# Patient Record
Sex: Male | Born: 1940 | Race: White | Hispanic: No | Marital: Married | Smoking: Former smoker
Health system: Southern US, Community
[De-identification: ages and names within clinical notes are randomized; demographics above are authoritative.]

## PROBLEM LIST (undated history)

## (undated) DIAGNOSIS — K219 Gastro-esophageal reflux disease without esophagitis: Secondary | ICD-10-CM

## (undated) DIAGNOSIS — E785 Hyperlipidemia, unspecified: Secondary | ICD-10-CM

## (undated) DIAGNOSIS — R05 Cough: Secondary | ICD-10-CM

## (undated) DIAGNOSIS — I10 Essential (primary) hypertension: Secondary | ICD-10-CM

## (undated) DIAGNOSIS — M1990 Unspecified osteoarthritis, unspecified site: Secondary | ICD-10-CM

## (undated) HISTORY — DX: Hyperlipidemia, unspecified: E78.5

## (undated) HISTORY — DX: Essential (primary) hypertension: I10

## (undated) HISTORY — DX: Unspecified osteoarthritis, unspecified site: M19.90

## (undated) HISTORY — DX: Gastro-esophageal reflux disease without esophagitis: K21.9

---
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
## 2004-09-28 ENCOUNTER — Emergency Department (HOSPITAL_COMMUNITY): Admission: EM | Admit: 2004-09-28 | Discharge: 2004-09-28 | Payer: Self-pay | Admitting: Emergency Medicine

## 2010-08-10 ENCOUNTER — Inpatient Hospital Stay (HOSPITAL_COMMUNITY): Admission: EM | Admit: 2010-08-10 | Discharge: 2010-08-10 | Payer: Self-pay | Admitting: Emergency Medicine

## 2010-09-04 ENCOUNTER — Ambulatory Visit: Payer: Self-pay | Admitting: Cardiovascular Disease

## 2010-09-12 ENCOUNTER — Telehealth (INDEPENDENT_AMBULATORY_CARE_PROVIDER_SITE_OTHER): Payer: Self-pay | Admitting: *Deleted

## 2010-09-16 ENCOUNTER — Ambulatory Visit: Payer: Self-pay

## 2010-09-16 ENCOUNTER — Ambulatory Visit: Payer: Self-pay | Admitting: Internal Medicine

## 2010-09-16 ENCOUNTER — Encounter: Payer: Self-pay | Admitting: Cardiology

## 2010-09-16 ENCOUNTER — Encounter (HOSPITAL_COMMUNITY): Admission: RE | Admit: 2010-09-16 | Discharge: 2010-11-28 | Payer: Self-pay | Admitting: Cardiovascular Disease

## 2010-09-18 ENCOUNTER — Ambulatory Visit: Payer: Self-pay

## 2011-01-28 NOTE — Progress Notes (Signed)
Summary: Nuclear Pre-Procedure  Phone Note Outgoing Call   Call placed by: Milana Na, EMT-P,  September 12, 2010 2:40 PM Summary of Call: Reviewed information on [employer] Information Sheet (see scanned document for further details).  Spoke with patient.     Nuclear Med Background Indications for Stress Test: Evaluation for Ischemia, Post Hospital  Indications Comments: 08/10/10 R/O MI (-) enzymes    Symptoms: Chest Pressure    Nuclear Pre-Procedure Cardiac Risk Factors: Family History - CAD, History of Smoking, Hypertension, Lipids  Nuclear Med Study Referring MD:  P.Nahser

## 2011-01-28 NOTE — Assessment & Plan Note (Signed)
Summary: Cardiology Nuclear Testing  Nuclear Med Background Indications for Stress Test: Evaluation for Ischemia, Post Hospital  Indications Comments: 08/10/10 R/O MI (-) enzymes    Symptoms: Chest Pressure, Palpitations    Nuclear Pre-Procedure Cardiac Risk Factors: Family History - CAD, History of Smoking, Hypertension, Lipids Caffeine/Decaff Intake: None NPO After: 7:30 PM Lungs: clear IV 0.9% NS with Angio Cath: 22g     IV Site: R Wrist IV Started by: Irean Hong, RN Chest Size (in) 50     Height (in): 74 Weight (lb): 233 BMI: 30.02  Nuclear Med Study 1 or 2 day study:  1 day     Stress Test Type:  Stress Reading MD:  Dietrich Pates, MD     Referring MD:  P.Nahser Resting Radionuclide:  Technetium 39m Tetrofosmin     Resting Radionuclide Dose:  33 mCi  Stress Radionuclide:  Technetium 61m Tetrofosmin     Stress Radionuclide Dose:  33 mCi   Stress Protocol Exercise Time (min):  6:00 min     Max HR:  134 bpm     Predicted Max HR:  153 bpm  Max Systolic BP: 196 mm Hg     Percent Max HR:  87.58 %     METS: 7.0 Rate Pressure Product:  04540    Stress Test Technologist:  Milana Na, EMT-P     Nuclear Technologist:  Domenic Polite, CNMT  Rest Procedure  Myocardial perfusion imaging was performed at rest 45 minutes following the intravenous administration of Technetium 35m Tetrofosmin.  Stress Procedure  The patient exercised for 6:00. The patient stopped due to fatigue and denied any chest pain.  There were no significant ST-T wave changes and rare pvcs.  Technetium 34m Tetrofosmin was injected at peak exercise and myocardial perfusion imaging was performed after a brief delay.  QPS Raw Data Images:  Mild diaphragmatic attenuation.  Normal left ventricular size. Stress Images:  Mild basal inferior perfusion defect.  Rest Images:  Mild basal inferior perfusion defect.  Subtraction (SDS):  Mild fixed basal inferior perfusion defect.  Transient Ischemic Dilatation:   0.81  (Normal <1.22)  Lung/Heart Ratio:  0.40  (Normal <0.45)  Quantitative Gated Spect Images QGS EDV:  78 ml QGS ESV:  23 ml QGS EF:  70 % QGS cine images:  Normal wall motion.    Overall Impression  Exercise Capacity: Fair exercise capacity. BP Response: Hypertensive blood pressure response. Clinical Symptoms: Fatigue, no chest pain.  ECG Impression: No significant ST segment change suggestive of ischemia. Overall Impression: Mild fixed basal inferior perfusion defect with normal wall motion likely represents diaphragmatic attenuation.  No ischemia.  Probably normal study.  Normal EF.

## 2011-03-14 LAB — DIFFERENTIAL
Basophils Absolute: 0.1 10*3/uL (ref 0.0–0.1)
Basophils Relative: 1 % (ref 0–1)
Eosinophils Absolute: 0.3 10*3/uL (ref 0.0–0.7)
Eosinophils Relative: 3 % (ref 0–5)
Lymphocytes Relative: 22 % (ref 12–46)
Lymphs Abs: 1.9 10*3/uL (ref 0.7–4.0)
Monocytes Absolute: 1 10*3/uL (ref 0.1–1.0)
Monocytes Relative: 11 % (ref 3–12)
Neutro Abs: 5.7 10*3/uL (ref 1.7–7.7)
Neutrophils Relative %: 64 % (ref 43–77)

## 2011-03-14 LAB — CK TOTAL AND CKMB (NOT AT ARMC)
CK, MB: 2.7 ng/mL (ref 0.3–4.0)
Relative Index: 2.6 — ABNORMAL HIGH (ref 0.0–2.5)
Total CK: 105 U/L (ref 7–232)

## 2011-03-14 LAB — CARDIAC PANEL(CRET KIN+CKTOT+MB+TROPI)
CK, MB: 2 ng/mL (ref 0.3–4.0)
CK, MB: 2.2 ng/mL (ref 0.3–4.0)
Relative Index: INVALID (ref 0.0–2.5)
Relative Index: INVALID (ref 0.0–2.5)
Total CK: 82 U/L (ref 7–232)
Total CK: 85 U/L (ref 7–232)
Troponin I: 0.02 ng/mL (ref 0.00–0.06)
Troponin I: <0.01 ng/mL (ref 0.00–0.06)

## 2011-03-14 LAB — CBC
HCT: 42.3 % (ref 39.0–52.0)
Hemoglobin: 14.6 g/dL (ref 13.0–17.0)
MCH: 34.8 pg — ABNORMAL HIGH (ref 26.0–34.0)
MCHC: 34.5 g/dL (ref 30.0–36.0)
MCV: 100.7 fL — ABNORMAL HIGH (ref 78.0–100.0)
Platelets: 172 10*3/uL (ref 150–400)
RBC: 4.2 MIL/uL — ABNORMAL LOW (ref 4.22–5.81)
RDW: 12.7 % (ref 11.5–15.5)
WBC: 8.9 10*3/uL (ref 4.0–10.5)

## 2011-03-14 LAB — POCT CARDIAC MARKERS
CKMB, poc: 1.2 ng/mL (ref 1.0–8.0)
Myoglobin, poc: 89.3 ng/mL (ref 12–200)
Troponin i, poc: <0.05 ng/mL (ref 0.00–0.09)

## 2011-03-14 LAB — LIPID PANEL
Cholesterol: 205 mg/dL — ABNORMAL HIGH (ref 0–200)
HDL: 34 mg/dL — ABNORMAL LOW (ref >39)
LDL Cholesterol: 129 mg/dL — ABNORMAL HIGH (ref 0–99)
Total CHOL/HDL Ratio: 6 RATIO
Triglycerides: 209 mg/dL — ABNORMAL HIGH (ref <150)
VLDL: 42 mg/dL — ABNORMAL HIGH (ref 0–40)

## 2011-03-14 LAB — BASIC METABOLIC PANEL
BUN: 12 mg/dL (ref 6–23)
CO2: 28 mEq/L (ref 19–32)
Calcium: 9.1 mg/dL (ref 8.4–10.5)
Chloride: 101 mEq/L (ref 96–112)
Creatinine, Ser: 1.07 mg/dL (ref 0.4–1.5)
GFR calc Af Amer: >60 mL/min (ref >60)
GFR calc non Af Amer: >60 mL/min (ref >60)
Glucose, Bld: 97 mg/dL (ref 70–99)
Potassium: 3.8 mEq/L (ref 3.5–5.1)
Sodium: 137 mEq/L (ref 135–145)

## 2011-03-14 LAB — TROPONIN I: Troponin I: <0.01 ng/mL (ref 0.00–0.06)

## 2012-05-05 ENCOUNTER — Encounter: Payer: Self-pay | Admitting: *Deleted

## 2012-05-05 DIAGNOSIS — M1990 Unspecified osteoarthritis, unspecified site: Secondary | ICD-10-CM | POA: Insufficient documentation

## 2012-05-05 DIAGNOSIS — R079 Chest pain, unspecified: Secondary | ICD-10-CM | POA: Insufficient documentation

## 2013-03-16 ENCOUNTER — Encounter: Payer: Self-pay | Admitting: Cardiology

## 2016-08-08 ENCOUNTER — Emergency Department (HOSPITAL_COMMUNITY)
Admission: EM | Admit: 2016-08-08 | Discharge: 2016-08-08 | Disposition: A | Discharge status: Home / Self Care | Payer: Medicare HMO | Attending: Emergency Medicine | Admitting: Emergency Medicine

## 2016-08-08 ENCOUNTER — Emergency Department (HOSPITAL_COMMUNITY): Payer: Medicare HMO

## 2016-08-08 ENCOUNTER — Encounter (HOSPITAL_COMMUNITY): Payer: Self-pay | Admitting: Emergency Medicine

## 2016-08-08 DIAGNOSIS — I10 Essential (primary) hypertension: Secondary | ICD-10-CM | POA: Insufficient documentation

## 2016-08-08 DIAGNOSIS — R12 Heartburn: Secondary | ICD-10-CM | POA: Insufficient documentation

## 2016-08-08 DIAGNOSIS — Z87891 Personal history of nicotine dependence: Secondary | ICD-10-CM | POA: Diagnosis not present

## 2016-08-08 DIAGNOSIS — R079 Chest pain, unspecified: Secondary | ICD-10-CM

## 2016-08-08 LAB — I-STAT TROPONIN, ED: Troponin i, poc: 0 ng/mL (ref 0.00–0.08)

## 2016-08-08 LAB — COMPREHENSIVE METABOLIC PANEL
ALT: 36 U/L (ref 17–63)
AST: 30 U/L (ref 15–41)
Albumin: 4.2 g/dL (ref 3.5–5.0)
Alkaline Phosphatase: 58 U/L (ref 38–126)
Anion gap: 11 (ref 5–15)
BUN: 22 mg/dL — ABNORMAL HIGH (ref 6–20)
CO2: 25 mmol/L (ref 22–32)
Calcium: 9.5 mg/dL (ref 8.9–10.3)
Chloride: 99 mmol/L — ABNORMAL LOW (ref 101–111)
Creatinine, Ser: 1.06 mg/dL (ref 0.61–1.24)
GFR calc Af Amer: >60 mL/min (ref >60)
GFR calc non Af Amer: >60 mL/min (ref >60)
Glucose, Bld: 136 mg/dL — ABNORMAL HIGH (ref 65–99)
Potassium: 3.8 mmol/L (ref 3.5–5.1)
Sodium: 135 mmol/L (ref 135–145)
Total Bilirubin: 0.3 mg/dL (ref 0.3–1.2)
Total Protein: 7.1 g/dL (ref 6.5–8.1)

## 2016-08-08 LAB — CBC WITH DIFFERENTIAL/PLATELET
Basophils Absolute: 0.1 10*3/uL (ref 0.0–0.1)
Basophils Relative: 1 %
Eosinophils Absolute: 0.4 10*3/uL (ref 0.0–0.7)
Eosinophils Relative: 5 %
HCT: 46.2 % (ref 39.0–52.0)
Hemoglobin: 15.3 g/dL (ref 13.0–17.0)
Lymphocytes Relative: 39 %
Lymphs Abs: 3.2 10*3/uL (ref 0.7–4.0)
MCH: 33.3 pg (ref 26.0–34.0)
MCHC: 33.1 g/dL (ref 30.0–36.0)
MCV: 100.7 fL — ABNORMAL HIGH (ref 78.0–100.0)
Monocytes Absolute: 0.7 10*3/uL (ref 0.1–1.0)
Monocytes Relative: 8 %
Neutro Abs: 3.8 10*3/uL (ref 1.7–7.7)
Neutrophils Relative %: 47 %
Platelets: 223 10*3/uL (ref 150–400)
RBC: 4.59 MIL/uL (ref 4.22–5.81)
RDW: 12.6 % (ref 11.5–15.5)
WBC: 8.2 10*3/uL (ref 4.0–10.5)

## 2016-08-08 NOTE — ED Provider Notes (Signed)
[facility]PT Provider Note   CSN: [id number] Arrival date & time: 08/08/16  7829  First Provider Contact:  None       History   Chief Complaint Chief Complaint  Patient presents with  . Hypertension  . Heartburn    HPI Tristan Le is a 75 y.o. male.  The history is provided by the patient.  Hypertension  This is a new problem. The current episode started 3 to 5 hours ago. The problem occurs constantly. The problem has been gradually improving. Associated symptoms include chest pain. Pertinent negatives include no abdominal pain, no headaches and no shortness of breath. Nothing aggravates the symptoms. Nothing relieves the symptoms.  Heartburn  This is a new problem. The current episode started 3 to 5 hours ago. The problem occurs constantly. The problem has been resolved. Associated symptoms include chest pain. Pertinent negatives include no abdominal pain, no headaches and no shortness of breath. Nothing aggravates the symptoms. Relieved by: alka seltzer. Treatments tried: Catering manager. The treatment provided moderate relief.  Patient with h/o GERD/HTN/hyperlipidemia presents with heartburn/chest pain and hypertension He reports he "ate like a horse" yesterday, and after going to bed he began having what felt like chest pain and heart burn He took alka seltzer and now feels improved No fever/vomiting/sob No diaphoresis Denies h/o CAD  Past Medical History:  Diagnosis Date  . Arthritis   . Chest pain   . GERD (gastroesophageal reflux disease)   . Hyperlipidemia   . Hypertension     Patient Active Problem List   Diagnosis Date Noted  . Chest pain   . Arthritis     History reviewed. No pertinent surgical history.     Home Medications    Prior to Admission medications   Medication Sig Start Date End Date Taking? Authorizing Provider  cholecalciferol (VITAMIN D) 400 UNITS TABS Take by mouth.   Yes Historical Provider, MD  diphenhydrAMINE (BENADRYL) 25  mg capsule Take 25 mg by mouth every 6 (six) hours as needed for allergies.   Yes Historical Provider, MD  enalapril (VASOTEC) 10 MG tablet Take 10 mg by mouth daily.   Yes Historical Provider, MD  hydrochlorothiazide (MICROZIDE) 12.5 MG capsule Take 12.5 mg by mouth daily.   Yes Historical Provider, MD  meloxicam (MOBIC) 7.5 MG tablet Take 7.5 mg by mouth daily.   Yes Historical Provider, MD  Omega-3 Fatty Acids (FISH OIL PO) Take 1 capsule by mouth daily.   Yes Historical Provider, MD    Family History Family History  Problem Relation Age of Onset  . Heart disease Father   . Heart disease Brother   . Hyperlipidemia Brother     Social History Social History  Substance Use Topics  . Smoking status: Former Games developer  . Smokeless tobacco: Not on file  . Alcohol use No     Allergies   Penicillins and Sulfonamide derivatives   Review of Systems Review of Systems  Constitutional: Negative for diaphoresis and fever.  Respiratory: Negative for shortness of breath.   Cardiovascular: Positive for chest pain.  Gastrointestinal: Positive for heartburn. Negative for abdominal pain and vomiting.  Neurological: Negative for headaches.  All other systems reviewed and are negative.    Physical Exam Updated Vital Signs BP 135/78   Pulse 71   Temp 98.3 F (36.8 C) (Oral)   Resp 13   Ht  (1.854 m)   Wt 108.1 kg   SpO2 97%   BMI 31.44 kg/m  Physical Exam CONSTITUTIONAL: Well developed/well nourished HEAD: Normocephalic/atraumatic EYES: EOMI/PERRL ENMT: Mucous membranes moist NECK: supple no meningeal signs SPINE/BACK:entire spine nontender CV: S1/S2 noted, no murmurs/rubs/gallops noted LUNGS: Lungs are clear to auscultation bilaterally, no apparent distress ABDOMEN: soft, nontender, no rebound or guarding, bowel sounds noted throughout abdomen NEURO: Pt is awake/alert/appropriate, moves all extremitiesx4.  No facial droop.   EXTREMITIES: pulses normal/equal, full ROM, no  calf tenderness SKIN: warm, color normal PSYCH: no abnormalities of mood noted, alert and oriented to situation   ED Treatments / Results  Labs (all labs ordered are listed, but only abnormal results are displayed) Labs Reviewed  CBC WITH DIFFERENTIAL/PLATELET - Abnormal; Notable for the following:       Result Value   MCV 100.7 (*)    All other components within normal limits  COMPREHENSIVE METABOLIC PANEL - Abnormal; Notable for the following:    Chloride 99 (*)    Glucose, Bld 136 (*)    BUN 22 (*)    All other components within normal limits  I-STAT TROPOININ, ED    EKG  EKG Interpretation  Date/Time:  Friday August 08 2016 03:23:15 EDT Ventricular Rate:  81 PR Interval:  188 QRS Duration: 78 QT Interval:  376 QTC Calculation: 436 R Axis:   18 Text Interpretation:  Normal sinus rhythm Normal ECG No significant change since last tracing Confirmed by Bebe ShaggyWICKLINE  MD, Fortune Torosian ([id number]) on 08/08/2016 5:54:16 AM       Radiology Dg Chest 2 View  Result Date: 08/08/2016 CLINICAL DATA:  Acute onset of central chest pain and discomfort. Initial encounter. EXAM: CHEST  2 VIEW COMPARISON:  Chest radiograph performed 08/09/2010 FINDINGS: The lungs are well-aerated and clear. There is no evidence of focal opacification, pleural effusion or pneumothorax. The heart is normal in size; the mediastinal contour is within normal limits. No acute osseous abnormalities are seen. IMPRESSION: No acute cardiopulmonary process seen. Electronically Signed   By: Roanna RaiderJeffery  Chang M.D.   On: 08/08/2016 04:19    Procedures Procedures (including critical care time)  Medications Ordered in ED Medications - No data to display   Initial Impression / Assessment and Plan / ED Course  I have reviewed the triage vital signs and the nursing notes.  Pertinent labs & imaging results that were available during my care of the patient were reviewed by me and considered in my medical decision making (see chart for  details).  Clinical Course    Pt well appearing He reports episode of CP/heartburn.  He was noted to have hypertension at home that was concerning to him Given history/age, I did advise further testing and potentially admission for ACS evaluation Patient would prefer to go home.  He thinks it is all related to eating heavily yesterday I discussed that I Can not rule out ACS with one ekg/troponin and he understands this risk We discussed strict ER return precautions   Final Clinical Impressions(s) / ED Diagnoses   Final diagnoses:  Chest pain, unspecified chest pain type  Heart burn    New Prescriptions New Prescriptions   No medications on file     Zadie Rhineonald Taylin Mans, MD 08/08/16 [phone]

## 2016-08-08 NOTE — Discharge Instructions (Signed)

## 2016-08-08 NOTE — ED Triage Notes (Signed)
Pt. woke up at midnight with " heartburn " took Brink's Companylka Seltzer with relief , pt. added elevated blood pressure this evening 188/75 and chills .

## 2017-01-27 ENCOUNTER — Encounter (HOSPITAL_COMMUNITY): Payer: Self-pay

## 2017-01-27 DIAGNOSIS — Z87891 Personal history of nicotine dependence: Secondary | ICD-10-CM | POA: Diagnosis not present

## 2017-01-27 DIAGNOSIS — M79652 Pain in left thigh: Secondary | ICD-10-CM | POA: Diagnosis present

## 2017-01-27 DIAGNOSIS — I714 Abdominal aortic aneurysm, without rupture: Secondary | ICD-10-CM | POA: Insufficient documentation

## 2017-01-27 DIAGNOSIS — I10 Essential (primary) hypertension: Secondary | ICD-10-CM | POA: Insufficient documentation

## 2017-01-27 NOTE — ED Triage Notes (Signed)
Pt states she started having sharp stabbing pain in left thigh tonight; pt states " I belive I have a DVT"; pt denies hx of DVT; pt has not symptoms of DVT, denies redness swelling, injury, or SOB; Pt state he just had sudden onset of thigh pain; pt ambulated to triage room; pt a&ox 4 on arrival; pt states pain at 2/10 on arrival

## 2017-01-28 ENCOUNTER — Emergency Department (HOSPITAL_BASED_OUTPATIENT_CLINIC_OR_DEPARTMENT_OTHER): Payer: Medicare HMO

## 2017-01-28 ENCOUNTER — Emergency Department (HOSPITAL_COMMUNITY)
Admission: EM | Admit: 2017-01-28 | Discharge: 2017-01-28 | Disposition: A | Discharge status: Home / Self Care | Payer: Medicare HMO | Attending: Emergency Medicine | Admitting: Emergency Medicine

## 2017-01-28 DIAGNOSIS — M79609 Pain in unspecified limb: Secondary | ICD-10-CM | POA: Diagnosis not present

## 2017-01-28 DIAGNOSIS — I714 Abdominal aortic aneurysm, without rupture, unspecified: Secondary | ICD-10-CM

## 2017-01-28 DIAGNOSIS — M79605 Pain in left leg: Secondary | ICD-10-CM

## 2017-01-28 LAB — CBC WITH DIFFERENTIAL/PLATELET
Basophils Absolute: 0 10*3/uL (ref 0.0–0.1)
Basophils Relative: 0 %
Eosinophils Absolute: 0.2 10*3/uL (ref 0.0–0.7)
Eosinophils Relative: 2 %
HCT: 45 % (ref 39.0–52.0)
Hemoglobin: 15.3 g/dL (ref 13.0–17.0)
Lymphocytes Relative: 25 %
Lymphs Abs: 2.5 10*3/uL (ref 0.7–4.0)
MCH: 34.1 pg — ABNORMAL HIGH (ref 26.0–34.0)
MCHC: 34 g/dL (ref 30.0–36.0)
MCV: 100.2 fL — ABNORMAL HIGH (ref 78.0–100.0)
Monocytes Absolute: 0.9 10*3/uL (ref 0.1–1.0)
Monocytes Relative: 9 %
Neutro Abs: 6.1 10*3/uL (ref 1.7–7.7)
Neutrophils Relative %: 64 %
Platelets: 201 10*3/uL (ref 150–400)
RBC: 4.49 MIL/uL (ref 4.22–5.81)
RDW: 12.2 % (ref 11.5–15.5)
WBC: 9.7 10*3/uL (ref 4.0–10.5)

## 2017-01-28 LAB — BASIC METABOLIC PANEL
Anion gap: 12 (ref 5–15)
BUN: 18 mg/dL (ref 6–20)
CO2: 23 mmol/L (ref 22–32)
Calcium: 9.7 mg/dL (ref 8.9–10.3)
Chloride: 99 mmol/L — ABNORMAL LOW (ref 101–111)
Creatinine, Ser: 1.05 mg/dL (ref 0.61–1.24)
GFR calc Af Amer: >60 mL/min (ref >60)
GFR calc non Af Amer: >60 mL/min (ref >60)
Glucose, Bld: 121 mg/dL — ABNORMAL HIGH (ref 65–99)
Potassium: 4.3 mmol/L (ref 3.5–5.1)
Sodium: 134 mmol/L — ABNORMAL LOW (ref 135–145)

## 2017-01-28 LAB — D-DIMER, QUANTITATIVE (NOT AT ARMC): D-Dimer, Quant: 0.6 ug/mL-FEU — ABNORMAL HIGH (ref 0.00–0.50)

## 2017-01-28 MED ORDER — RIVAROXABAN 15 MG PO TABS
15.0000 mg | ORAL_TABLET | Freq: Once | ORAL | Status: AC
  Administered 2017-01-28: 15 mg via ORAL
  Filled 2017-01-28: qty 1

## 2017-01-28 MED ORDER — HYDROCODONE-ACETAMINOPHEN 5-325 MG PO TABS
1.0000 | ORAL_TABLET | Freq: Once | ORAL | Status: AC
  Administered 2017-01-28: 1 via ORAL
  Filled 2017-01-28: qty 1

## 2017-01-28 NOTE — Discharge Instructions (Signed)
The ultrasound was negative for a blood clot. Please follow up with your primary care provider as soon as possible for any further management of this issue. May use ibuprofen or naproxen for pain.

## 2017-01-28 NOTE — ED Provider Notes (Signed)
Took patient care [facility]off [facility]-C.  In short, patient presents with left upper leg pain and is awaiting a DVT rule out with duplex ultrasound. If positive, anticoagulate and discharge. If negative, discharge with pain management and PCP follow up.   Duplex ultrasound negative for DVT. Patient updated on results. PCP follow-up. Return precautions discussed. Patient states he does not need or want narcotic pain medications and is content taking ibuprofen.   Vitals:   01/28/17 0400 01/28/17 0430 01/28/17 0500 01/28/17 0600  BP: 111/68 114/68 98/56 99/59   Pulse: 73 77 76 74  Resp:      Temp:      TempSrc:      SpO2: 95% 96% 95% 95%  Weight:      Height:          Results for orders placed or performed during the hospital encounter of 01/28/17  D-dimer, quantitative  Result Value Ref Range   D-Dimer, Quant 0.60 (H) 0.00 - 0.50 ug/mL-FEU  CBC with Differential  Result Value Ref Range   WBC 9.7 4.0 - 10.5 K/uL   RBC 4.49 4.22 - 5.81 MIL/uL   Hemoglobin 15.3 13.0 - 17.0 g/dL   HCT 16.145.0 09.639.0 - 04.552.0 %   MCV 100.2 (H) 78.0 - 100.0 fL   MCH 34.1 (H) 26.0 - 34.0 pg   MCHC 34.0 30.0 - 36.0 g/dL   RDW 40.912.2 81.111.5 - 91.415.5 %   Platelets 201 150 - 400 K/uL   Neutrophils Relative % 64 %   Neutro Abs 6.1 1.7 - 7.7 K/uL   Lymphocytes Relative 25 %   Lymphs Abs 2.5 0.7 - 4.0 K/uL   Monocytes Relative 9 %   Monocytes Absolute 0.9 0.1 - 1.0 K/uL   Eosinophils Relative 2 %   Eosinophils Absolute 0.2 0.0 - 0.7 K/uL   Basophils Relative 0 %   Basophils Absolute 0.0 0.0 - 0.1 K/uL  Basic metabolic panel  Result Value Ref Range   Sodium 134 (L) 135 - 145 mmol/L   Potassium 4.3 3.5 - 5.1 mmol/L   Chloride 99 (L) 101 - 111 mmol/L   CO2 23 22 - 32 mmol/L   Glucose, Bld 121 (H) 65 - 99 mg/dL   BUN 18 6 - 20 mg/dL   Creatinine, Ser 7.821.05 0.61 - 1.24 mg/dL   Calcium 9.7 8.9 - 95.610.3 mg/dL   GFR calc non Af Amer >60 >60 mL/min   GFR calc Af Amer >60 >60 mL/min   Anion gap 12 5 -  15   No results found.   Anselm PancoastShawn C Pryce Folts, PA-C 01/28/17 [id number]    Anselm PancoastShawn C Jerryl Holzhauer, PA-C 01/28/17 [phone]    Maia PlanJoshua G Long, MD 01/28/17 [phone]

## 2017-01-28 NOTE — ED Provider Notes (Signed)
[facility] Provider Note   CSN: [id number] Arrival date & time: 01/27/17  2335     History   Chief Complaint Chief Complaint  Patient presents with  . Leg Pain    HPI Tristan Le is a 76 y.o. male.  HPI Patient presents to the emergency department with needed.  Left thigh pain started tonight around 10 PM the patient states that he thinks he has a DVT based on his professional opinion.  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  Patient states that palpation of the specific area makes the pain worse.  He states he has not had any redness, swelling, shortness of breath or chest pain. The patient denies chest pain, shortness of breath, headache,blurred vision, neck pain, fever, cough, weakness, numbness, dizziness, anorexia, edema, abdominal pain, nausea, vomiting, diarrhea, rash, back pain, dysuria, hematemesis, bloody stool, near syncope, or syncope. Past Medical History:  Diagnosis Date  . Arthritis   . Chest pain   . GERD (gastroesophageal reflux disease)   . Hyperlipidemia   . Hypertension     Patient Active Problem List   Diagnosis Date Noted  . Chest pain   . Arthritis     History reviewed. No pertinent surgical history.     Home Medications    Prior to Admission medications   Medication Sig Start Date End Date Taking? Authorizing Provider  enalapril (VASOTEC) 10 MG tablet Take 10 mg by mouth daily.   Yes Historical Provider, MD  hydrochlorothiazide (MICROZIDE) 12.5 MG capsule Take 12.5 mg by mouth daily.   Yes Historical Provider, MD  meloxicam (MOBIC) 7.5 MG tablet Take 7.5 mg by mouth daily as needed for pain.    Yes Historical Provider, MD    Family History Family History  Problem Relation Age of Onset  . Heart disease Father   . Heart disease Brother   . Hyperlipidemia Brother     Social History Social History  Substance Use Topics  . Smoking status: Former Games developer  .  Smokeless tobacco: Not on file  . Alcohol use No     Allergies   Penicillins and Sulfonamide derivatives   Review of Systems Review of Systems All other systems negative except as documented in the HPI. All pertinent positives and negatives as reviewed in the HPI.  Physical Exam Updated Vital Signs BP 99/59   Pulse 74   Temp 97.4 F (36.3 C) (Oral)   Resp 20   Ht 6\' 1"  (1.854 m)   Wt 104.8 kg   SpO2 95%   BMI 30.48 kg/m   Physical Exam  Constitutional: He is oriented to person, place, and time. He appears well-developed and well-nourished. No distress.  HENT:  Head: Normocephalic and atraumatic.  Mouth/Throat: Oropharynx is clear and moist.  Eyes: Pupils are equal, round, and reactive to light.  Neck: Normal range of motion. Neck supple.  Cardiovascular: Normal rate, regular rhythm and normal heart sounds.  Exam reveals no gallop and no friction rub.   No murmur heard. Pulmonary/Chest: Effort normal and breath sounds normal. No respiratory distress. He has no wheezes.  Abdominal: Soft. Bowel sounds are normal. He exhibits no distension. There is no tenderness.  Musculoskeletal:       Legs: Neurological: He is alert and oriented to person, place, and time. He exhibits normal muscle tone. Coordination normal.  Skin: Skin is warm and dry. No rash noted. No erythema.  Psychiatric:  He has a normal mood and affect. His behavior is normal.  Nursing note and vitals reviewed.    ED Treatments / Results  Labs (all labs ordered are listed, but only abnormal results are displayed) Labs Reviewed  D-DIMER, QUANTITATIVE (NOT AT [facility]) - Abnormal; Notable for the following:       Result Value   D-Dimer, Quant 0.60 (*)    All other components within normal limits  CBC WITH DIFFERENTIAL/PLATELET - Abnormal; Notable for the following:    MCV 100.2 (*)    MCH 34.1 (*)    All other components within normal limits  BASIC METABOLIC PANEL - Abnormal; Notable for the following:     Sodium 134 (*)    Chloride 99 (*)    Glucose, Bld 121 (*)    All other components within normal limits    EKG  EKG Interpretation None       Radiology No results found.  Procedures Procedures (including critical care time)  Medications Ordered in ED Medications  HYDROcodone-acetaminophen (NORCO/VICODIN) 5-325 MG per tablet 1 tablet (1 tablet Oral Given 01/28/17 0338)  Rivaroxaban (XARELTO) tablet 15 mg (15 mg Oral Given 01/28/17 0631)     Initial Impression / Assessment and Plan / ED Course  I have reviewed the triage vital signs and the nursing notes.  Pertinent labs & imaging results that were available during my care of the patient were reviewed by me and considered in my medical decision making (see chart for details).     Patient will have a DVT study in the morning.  If this is negative and a referral back to his primary care doctor.   Final Clinical Impressions(s) / ED Diagnoses   Final diagnoses:  None    New Prescriptions New Prescriptions   No medications on file     Charlestine NightChristopher Ayden Hardwick, PA-C 01/28/17 [id number]    Dione Boozeavid Glick, MD 01/28/17 [phone]

## 2017-01-28 NOTE — Progress Notes (Signed)
*  PRELIMINARY RESULTS* Vascular Ultrasound Left lower extremity venous duplex has been completed.  Preliminary findings: No evidence of DVT or baker's cyst.    Farrel DemarkJill Eunice, RDMS, RVT  01/28/2017, 8:04 AM

## 2017-04-27 ENCOUNTER — Other Ambulatory Visit (HOSPITAL_COMMUNITY): Payer: Self-pay | Admitting: General Practice

## 2017-04-27 DIAGNOSIS — R1011 Right upper quadrant pain: Secondary | ICD-10-CM

## 2017-05-01 ENCOUNTER — Ambulatory Visit (HOSPITAL_COMMUNITY)
Admission: RE | Admit: 2017-05-01 | Discharge: 2017-05-01 | Disposition: A | Discharge status: Home / Self Care | Payer: Medicare HMO | Source: Ambulatory Visit | Attending: General Practice | Admitting: General Practice

## 2017-05-01 DIAGNOSIS — R1011 Right upper quadrant pain: Secondary | ICD-10-CM | POA: Insufficient documentation

## 2018-02-08 ENCOUNTER — Encounter (HOSPITAL_COMMUNITY): Payer: Self-pay | Admitting: Emergency Medicine

## 2018-02-08 ENCOUNTER — Ambulatory Visit (HOSPITAL_COMMUNITY)
Admission: EM | Admit: 2018-02-08 | Discharge: 2018-02-08 | Disposition: A | Discharge status: Home / Self Care | Payer: Medicare HMO

## 2018-02-08 DIAGNOSIS — R109 Unspecified abdominal pain: Secondary | ICD-10-CM

## 2018-02-08 DIAGNOSIS — R1011 Right upper quadrant pain: Secondary | ICD-10-CM

## 2018-02-08 DIAGNOSIS — R110 Nausea: Secondary | ICD-10-CM

## 2018-02-08 MED ORDER — OMEPRAZOLE 20 MG PO CPDR: 20.0000 mg | DELAYED_RELEASE_CAPSULE | Freq: Every day | ORAL | 1 refills | Status: DC

## 2018-02-08 NOTE — ED Triage Notes (Signed)
PT reports RUQ pain for 3-4 months. PT saw his PCP 2-3 weeks ago and is being treated for a suspected peptic ulcer. PT is taking zantac and a probiotic. PT reports nausea started today. No change in BMs

## 2018-02-08 NOTE — ED Provider Notes (Signed)
  MRN: [id number] DOB: 20-Mar-1941  Subjective:   Tristan Le is a 77 y.o. male presenting for 4 month history of RUQ and epigastric pain. Pain is throbbing or pressure type sensation, worse with eating beef. Patient has had GI issues that he's worked on for years, thought he had lactose intolerance. Last OV was 12/30/2017 with his PCP. He is currently using ranitidine for peptic ulcer. He did not have imaging, bloodwork done, admits that he did not want this at the time. Has mild occasional nausea without vomiting. Denies fever, constipation, bloody stools, diarrhea, hematuria, dysuria, chest pain. Denies smoking cigarettes or drinking alcohol.   Tristan Le is allergic to penicillins and sulfonamide derivatives.  Tristan Le  has a past medical history of Arthritis, Chest pain, GERD (gastroesophageal reflux disease), Hyperlipidemia, and Hypertension. Denies history of surgeries. His family history includes Heart disease in his brother and father; Hyperlipidemia in his brother.   Objective:   Vitals: BP 129/69   Pulse 89   Temp 98.3 F (36.8 C) (Oral)   Resp 16   Wt 220 lb (99.8 kg)   SpO2 100%   BMI 29.03 kg/m   Physical Exam  Constitutional: He is oriented to person, place, and time. He appears well-developed and well-nourished.  HENT:  Mouth/Throat: Oropharynx is clear and moist.  Cardiovascular: Normal rate, regular rhythm and intact distal pulses. Exam reveals no gallop and no friction rub.  No murmur heard. Pulmonary/Chest: No respiratory distress. He has no wheezes. He has no rales.  Abdominal: Soft. Bowel sounds are normal. He exhibits no distension and no mass. There is no tenderness. There is no guarding.  Neurological: He is alert and oriented to person, place, and time.  Skin: Skin is warm and dry.  Psychiatric: He has a normal mood and affect.   Assessment and Plan :   Right upper quadrant abdominal pain  Nausea  Right flank pain  Unable to elicit pain on exam.  Counseled that patient should try to get U/S, CT and/or bloodwork as his PCP recommended for his pain. Stop ranitidine given that he has been on this for 3 months now. Start Prilosec, hydrate well. Return-to-clinic precautions discussed, patient verbalized understanding.    Wallis BambergMani, Antoneo Ghrist, PA-C 02/08/18 1701

## 2018-02-08 NOTE — Discharge Instructions (Signed)
Hydrate well with at least 2 liters (1 gallon) of water daily. You may take 500mg  Tylenol every 6 hours for pain and inflammation. Do not take any NSAID including diclofenac, ibuprofen, naproxen, Aleve, Motrin, etc.

## 2018-02-11 ENCOUNTER — Encounter (HOSPITAL_COMMUNITY): Payer: Self-pay | Admitting: Emergency Medicine

## 2018-02-11 ENCOUNTER — Other Ambulatory Visit: Payer: Self-pay

## 2018-02-11 DIAGNOSIS — I10 Essential (primary) hypertension: Secondary | ICD-10-CM | POA: Insufficient documentation

## 2018-02-11 DIAGNOSIS — Z79899 Other long term (current) drug therapy: Secondary | ICD-10-CM | POA: Insufficient documentation

## 2018-02-11 DIAGNOSIS — R1011 Right upper quadrant pain: Secondary | ICD-10-CM | POA: Insufficient documentation

## 2018-02-11 DIAGNOSIS — R1031 Right lower quadrant pain: Secondary | ICD-10-CM | POA: Insufficient documentation

## 2018-02-11 LAB — COMPREHENSIVE METABOLIC PANEL
ALBUMIN: 3.8 g/dL (ref 3.5–5.0)
ALT: 30 U/L (ref 17–63)
AST: 29 U/L (ref 15–41)
Alkaline Phosphatase: 57 U/L (ref 38–126)
Anion gap: 9 (ref 5–15)
BUN: 15 mg/dL (ref 6–20)
CO2: 24 mmol/L (ref 22–32)
CREATININE: 1.08 mg/dL (ref 0.61–1.24)
Calcium: 9.1 mg/dL (ref 8.9–10.3)
Chloride: 102 mmol/L (ref 101–111)
GFR calc Af Amer: >60 mL/min (ref >60)
GFR calc non Af Amer: >60 mL/min (ref >60)
Glucose, Bld: 164 mg/dL — ABNORMAL HIGH (ref 65–99)
Potassium: 3.8 mmol/L (ref 3.5–5.1)
SODIUM: 135 mmol/L (ref 135–145)
Total Bilirubin: 0.6 mg/dL (ref 0.3–1.2)
Total Protein: 6.6 g/dL (ref 6.5–8.1)

## 2018-02-11 LAB — URINALYSIS, ROUTINE W REFLEX MICROSCOPIC
Bilirubin Urine: NEGATIVE
GLUCOSE, UA: NEGATIVE mg/dL
HGB URINE DIPSTICK: NEGATIVE
Ketones, ur: NEGATIVE mg/dL
Leukocytes, UA: NEGATIVE
Nitrite: NEGATIVE
Protein, ur: NEGATIVE mg/dL
Specific Gravity, Urine: 1.012 (ref 1.005–1.030)
pH: 7 (ref 5.0–8.0)

## 2018-02-11 LAB — LIPASE, BLOOD: LIPASE: 37 U/L (ref 11–51)

## 2018-02-11 LAB — CBC
HCT: 43.9 % (ref 39.0–52.0)
Hemoglobin: 14.9 g/dL (ref 13.0–17.0)
MCH: 34.4 pg — AB (ref 26.0–34.0)
MCHC: 33.9 g/dL (ref 30.0–36.0)
MCV: 101.4 fL — AB (ref 78.0–100.0)
PLATELETS: 199 10*3/uL (ref 150–400)
RBC: 4.33 MIL/uL (ref 4.22–5.81)
RDW: 12.3 % (ref 11.5–15.5)
WBC: 8 10*3/uL (ref 4.0–10.5)

## 2018-02-11 NOTE — ED Triage Notes (Signed)
Patient with abdominal pain, radiating from stomach around to his back.  [facility] back to his PCP, but they are unable to see him until Monday.  Patient returns with the pain, no nausea or vomiting at this time.  He states he thinks he is having gall bladder problems but he has been treated for peptic ulcer.  Patient states that the pain is always there and some foods do make the pain worse.

## 2018-02-12 ENCOUNTER — Other Ambulatory Visit: Payer: Self-pay

## 2018-02-12 ENCOUNTER — Emergency Department (HOSPITAL_COMMUNITY): Payer: Medicare HMO

## 2018-02-12 ENCOUNTER — Emergency Department (HOSPITAL_COMMUNITY)
Admission: EM | Admit: 2018-02-12 | Discharge: 2018-02-12 | Disposition: A | Discharge status: Home / Self Care | Payer: Medicare HMO | Attending: Emergency Medicine | Admitting: Emergency Medicine

## 2018-02-12 DIAGNOSIS — R1010 Upper abdominal pain, unspecified: Secondary | ICD-10-CM

## 2018-02-12 MED ORDER — SUCRALFATE 1 GM/10ML PO SUSP: 1.0000 g | Freq: Three times a day (TID) | ORAL | 0 refills | Status: DC

## 2018-02-12 MED ORDER — IOPAMIDOL (ISOVUE-300) INJECTION 61%
INTRAVENOUS | Status: AC
  Administered 2018-02-12: 100 mL
  Filled 2018-02-12: qty 100

## 2018-02-12 NOTE — ED Provider Notes (Signed)
[facility]   CSN: [id number] Arrival date & time: 02/11/18  2056     History   Chief Complaint Chief Complaint  Patient presents with  . Abdominal Pain    HPI Tristan Le is a 77 y.o. male.  The history is provided by the patient.  Abdominal Pain   This is a new problem. The current episode started more than 1 week ago. The problem occurs constantly. The problem has not changed since onset.The pain is associated with an unknown factor. The pain is located in the RUQ and RLQ. The pain is moderate. Pertinent negatives include fever, belching, diarrhea, flatus, hematochezia, melena, nausea, vomiting, constipation, dysuria, frequency, hematuria, headaches, arthralgias and myalgias. Nothing aggravates the symptoms. Nothing relieves the symptoms. Past workup does not include GI consult. His past medical history does not include ulcerative colitis.    Past Medical History:  Diagnosis Date  . Arthritis   . Chest pain   . GERD (gastroesophageal reflux disease)   . Hyperlipidemia   . Hypertension     Patient Active Problem List   Diagnosis Date Noted  . Chest pain   . Arthritis     History reviewed. No pertinent surgical history.     Home Medications    Prior to Admission medications   Medication Sig Start Date End Date Taking? Authorizing Provider  enalapril (VASOTEC) 10 MG tablet Take 10 mg by mouth daily.    [provider]  hydrochlorothiazide (MICROZIDE) 12.5 MG capsule Take 12.5 mg by mouth daily.    [provider]  omeprazole (PRILOSEC) 20 MG capsule Take 1 capsule (20 mg total) by mouth daily. 02/08/18   Wallis BambergMani, Mario, PA-C  ranitidine (ZANTAC) 300 MG tablet Take 300 mg by mouth at bedtime.    [provider]    Family History Family History  Problem Relation Age of Onset  . Heart disease Father   . Heart disease Brother   . Hyperlipidemia Brother     Social History Social  History   Tobacco Use  . Smoking status: Former Smoker  Substance Use Topics  . Alcohol use: No  . Drug use: No     Allergies   Penicillins and Sulfonamide derivatives   Review of Systems Review of Systems  Constitutional: Negative for fever.  Respiratory: Negative for shortness of breath.   Cardiovascular: Negative for chest pain.  Gastrointestinal: Positive for abdominal pain. Negative for anal bleeding, blood in stool, constipation, diarrhea, flatus, hematochezia, melena, nausea and vomiting.  Genitourinary: Negative for dysuria, frequency and hematuria.  Musculoskeletal: Negative for arthralgias and myalgias.  Neurological: Negative for headaches.  All other systems reviewed and are negative.    Physical Exam Updated Vital Signs BP 127/67 (BP Location: Right Arm)   Pulse 78   Temp 98.1 F (36.7 C) (Oral)   Resp 18   SpO2 99%   Physical Exam  Constitutional: He is oriented to person, place, and time. He appears well-developed and well-nourished. No distress.  HENT:  Head: Normocephalic and atraumatic.  Right Ear: External ear normal.  Left Ear: External ear normal.  Mouth/Throat: Oropharynx is clear and moist. No oropharyngeal exudate.  Eyes: Conjunctivae are normal. Pupils are equal, round, and reactive to light.  Neck: Normal range of motion. Neck supple.  Cardiovascular: Normal rate, regular rhythm, normal heart sounds and intact distal pulses.  Pulmonary/Chest: Effort normal and breath sounds normal. No stridor. He has no wheezes. He has no rales.  Abdominal: Soft. He exhibits no mass. There is no tenderness. There is no rebound and no guarding.  Gassy   Musculoskeletal: Normal range of motion.  Neurological: He is alert and oriented to person, place, and time. He displays normal reflexes.  Skin: Skin is warm and dry. Capillary refill takes less than 2 seconds.  Nursing note and vitals reviewed.    ED Treatments / Results  Labs (all labs ordered are  listed, but only abnormal results are displayed)  Results for orders placed or performed during the hospital encounter of 02/12/18  Lipase, blood  Result Value Ref Range   Lipase 37 11 - 51 U/L  Comprehensive metabolic panel  Result Value Ref Range   Sodium 135 135 - 145 mmol/L   Potassium 3.8 3.5 - 5.1 mmol/L   Chloride 102 101 - 111 mmol/L   CO2 24 22 - 32 mmol/L   Glucose, Bld 164 (H) 65 - 99 mg/dL   BUN 15 6 - 20 mg/dL   Creatinine, Ser 1.61 0.61 - 1.24 mg/dL   Calcium 9.1 8.9 - 09.6 mg/dL   Total Protein 6.6 6.5 - 8.1 g/dL   Albumin 3.8 3.5 - 5.0 g/dL   AST 29 15 - 41 U/L   ALT 30 17 - 63 U/L   Alkaline Phosphatase 57 38 - 126 U/L   Total Bilirubin 0.6 0.3 - 1.2 mg/dL   GFR calc non Af Amer >60 >60 mL/min   GFR calc Af Amer >60 >60 mL/min   Anion gap 9 5 - 15  CBC  Result Value Ref Range   WBC 8.0 4.0 - 10.5 K/uL   RBC 4.33 4.22 - 5.81 MIL/uL   Hemoglobin 14.9 13.0 - 17.0 g/dL   HCT 04.5 40.9 - 81.1 %   MCV 101.4 (H) 78.0 - 100.0 fL   MCH 34.4 (H) 26.0 - 34.0 pg   MCHC 33.9 30.0 - 36.0 g/dL   RDW 91.4 78.2 - 95.6 %   Platelets 199 150 - 400 K/uL  Urinalysis, Routine w reflex microscopic  Result Value Ref Range   Color, Urine YELLOW YELLOW   APPearance CLEAR CLEAR   Specific Gravity, Urine 1.012 1.005 - 1.030   pH 7.0 5.0 - 8.0   Glucose, UA NEGATIVE NEGATIVE mg/dL   Hgb urine dipstick NEGATIVE NEGATIVE   Bilirubin Urine NEGATIVE NEGATIVE   Ketones, ur NEGATIVE NEGATIVE mg/dL   Protein, ur NEGATIVE NEGATIVE mg/dL   Nitrite NEGATIVE NEGATIVE   Leukocytes, UA NEGATIVE NEGATIVE   Ct Abdomen Pelvis W Contrast  Result Date: 02/12/2018 CLINICAL DATA:  Abdominal pain radiating from stomach to back. History of peptic ulcer disease. EXAM: CT ABDOMEN AND PELVIS WITH CONTRAST TECHNIQUE: Multidetector CT imaging of the abdomen and pelvis was performed using the standard protocol following bolus administration of intravenous contrast. CONTRAST:  ISOVUE-300 IOPAMIDOL  (ISOVUE-300) INJECTION 61% COMPARISON:  Abdominal ultrasound May 01, 2017 FINDINGS: LOWER CHEST: Minimal subpleural fibro nodular densities, possible mild fibrosis. Included heart size is normal. Mild coronary artery calcifications. RIGHT she is mediastinal calcified lymph nodes. No pericardial effusion. HEPATOBILIARY: Liver and gallbladder are normal. PANCREAS: Normal. SPLEEN: Calcified punctate splenic granulomas. ADRENALS/URINARY TRACT: Kidneys are orthotopic, demonstrating symmetric enhancement. No nephrolithiasis, hydronephrosis or solid renal masses. Too small to characterize hypodensities bilateral kidneys. The unopacified ureters are normal in course and caliber. Delayed imaging through the kidneys demonstrates symmetric prompt contrast excretion within the proximal urinary collecting system. Urinary bladder is partially distended and unremarkable. Normal adrenal glands.  STOMACH/BOWEL: The stomach, small and large bowel are normal in course and caliber without inflammatory changes, sensitivity decreased without oral contrast. Multiple small duodenal diverticulum. Moderate to severe colonic diverticulosis. Mild amount of retained large bowel stool. Normal appendix. VASCULAR/LYMPHATIC: 3 cm infrarenal aortic aneurysm, mild calcific atherosclerosis. No lymphadenopathy by CT size criteria. REPRODUCTIVE: Normal. OTHER: No intraperitoneal free fluid or free air. 4.5 x 5.4 cm cyst RIGHT pelvis (-4 Hounsfield units. Cyst abuts the RIGHT aspect of the bladder. MUSCULOSKELETAL: Nonacute.  Mild degenerative change of the spine. IMPRESSION: 1. Moderate to severe colonic diverticulosis without diverticulitis nor acute intra abdominopelvic process. 2. 4.5 x 5.4 cm benign-appearing cyst RIGHT pelvis, differential diagnosis includes lymphocele, less likely bladder diverticulum, seminal vesicle cyst. 3. **An incidental finding of potential clinical significance has been found. 3 cm infrarenal aortic aneurysm. Recommend  followup by ultrasound in 3 years. This recommendation follows ACR consensus guidelines: White Paper of the ACR Incidental Findings Committee II on Vascular Findings. [facility] [employer] Coll Radiol 2013; 78:295-621** Electronically Signed   By: Awilda Metro M.D.   On: 02/12/2018 03:59    Radiology Ct Abdomen Pelvis W Contrast  Result Date: 02/12/2018 CLINICAL DATA:  Abdominal pain radiating from stomach to back. History of peptic ulcer disease. EXAM: CT ABDOMEN AND PELVIS WITH CONTRAST TECHNIQUE: Multidetector CT imaging of the abdomen and pelvis was performed using the standard protocol following bolus administration of intravenous contrast. CONTRAST:  ISOVUE-300 IOPAMIDOL (ISOVUE-300) INJECTION 61% COMPARISON:  Abdominal ultrasound May 01, 2017 FINDINGS: LOWER CHEST: Minimal subpleural fibro nodular densities, possible mild fibrosis. Included heart size is normal. Mild coronary artery calcifications. RIGHT she is mediastinal calcified lymph nodes. No pericardial effusion. HEPATOBILIARY: Liver and gallbladder are normal. PANCREAS: Normal. SPLEEN: Calcified punctate splenic granulomas. ADRENALS/URINARY TRACT: Kidneys are orthotopic, demonstrating symmetric enhancement. No nephrolithiasis, hydronephrosis or solid renal masses. Too small to characterize hypodensities bilateral kidneys. The unopacified ureters are normal in course and caliber. Delayed imaging through the kidneys demonstrates symmetric prompt contrast excretion within the proximal urinary collecting system. Urinary bladder is partially distended and unremarkable. Normal adrenal glands. STOMACH/BOWEL: The stomach, small and large bowel are normal in course and caliber without inflammatory changes, sensitivity decreased without oral contrast. Multiple small duodenal diverticulum. Moderate to severe colonic diverticulosis. Mild amount of retained large bowel stool. Normal appendix. VASCULAR/LYMPHATIC: 3 cm infrarenal aortic aneurysm, mild calcific  atherosclerosis. No lymphadenopathy by CT size criteria. REPRODUCTIVE: Normal. OTHER: No intraperitoneal free fluid or free air. 4.5 x 5.4 cm cyst RIGHT pelvis (-4 Hounsfield units. Cyst abuts the RIGHT aspect of the bladder. MUSCULOSKELETAL: Nonacute.  Mild degenerative change of the spine. IMPRESSION: 1. Moderate to severe colonic diverticulosis without diverticulitis nor acute intra abdominopelvic process. 2. 4.5 x 5.4 cm benign-appearing cyst RIGHT pelvis, differential diagnosis includes lymphocele, less likely bladder diverticulum, seminal vesicle cyst. 3. **An incidental finding of potential clinical significance has been found. 3 cm infrarenal aortic aneurysm. Recommend followup by ultrasound in 3 years. This recommendation follows ACR consensus guidelines: White Paper of the ACR Incidental Findings Committee II on Vascular Findings. [facility] [employer] Radiol 2013; 30:865-784** Electronically Signed   By: Awilda Metro M.D.   On: 02/12/2018 03:59    Procedures Procedures (including critical care time)  Medications Ordered in ED Medications  iopamidol (ISOVUE-300) 61 % injection (100 mLs  Contrast Given 02/12/18 0325)     Case d/w Dr. Randie Heinz, no need for emergent follow up  Final Clinical Impressions(s) / ED Diagnoses  Will refer to GI for  follow up.  Patient feels meat is getting stuck.  Symptoms and location are not consistent with biliary disease but more consistent with ulcer disease.  Will refer to GI and start carafate.  Will refer to vascular of aneurysm, patient verbalizes understanding and agrees to follow up.    Will place on ABX for PNA seen on cxr of admission as he was not given RX when he AMAed.    Return for weakness, numbness, changes in vision or speech,  fevers > 100.4 unrelieved by medication, shortness of breath, intractable vomiting, or diarrhea, abdominal pain, Inability to tolerate liquids or food, cough, altered mental status or any concerns. No signs of systemic illness or  infection. The patient is nontoxic-appearing on exam and vital signs are within normal limits.    I have reviewed the triage vital signs and the nursing notes. Pertinent labs &imaging results that were available during my care of the patient were reviewed by me and considered in my medical decision making (see chart for details).  After history, exam, and medical workup I feel the patient has been appropriately medically screened and is safe for discharge home. Pertinent diagnoses were discussed with the patient. Patient was given return precautions.     Treshon Stannard, MD 02/12/18 [phone]

## 2018-02-12 NOTE — ED Notes (Signed)
Patient verbalizes understanding of discharge instructions. Opportunity for questioning and answers were provided. Armband removed by staff, pt discharged from ED.  

## 2018-02-12 NOTE — ED Notes (Signed)
Patient denies pain and is resting comfortably.  

## 2018-02-17 ENCOUNTER — Encounter: Payer: Self-pay | Admitting: Gastroenterology

## 2018-03-31 ENCOUNTER — Encounter: Payer: Self-pay | Admitting: Gastroenterology

## 2018-03-31 ENCOUNTER — Encounter (INDEPENDENT_AMBULATORY_CARE_PROVIDER_SITE_OTHER): Payer: Self-pay

## 2018-03-31 ENCOUNTER — Ambulatory Visit: Payer: Medicare HMO | Admitting: Gastroenterology

## 2018-03-31 VITALS — BP 110/66 | HR 60 | Ht 73.0 in | Wt 220.0 lb

## 2018-03-31 DIAGNOSIS — K219 Gastro-esophageal reflux disease without esophagitis: Secondary | ICD-10-CM | POA: Diagnosis not present

## 2018-03-31 DIAGNOSIS — R1013 Epigastric pain: Secondary | ICD-10-CM

## 2018-03-31 MED ORDER — RANITIDINE HCL 300 MG PO TABS: 150.0000 mg | ORAL_TABLET | Freq: Every day | ORAL | 3 refills | Status: DC

## 2018-03-31 MED ORDER — PANTOPRAZOLE SODIUM 40 MG PO TBEC: 40.0000 mg | DELAYED_RELEASE_TABLET | Freq: Every day | ORAL | 3 refills | Status: DC

## 2018-03-31 MED ORDER — SUCRALFATE 1 GM/10ML PO SUSP: 1.0000 g | Freq: Four times a day (QID) | ORAL | 2 refills | Status: DC | PRN

## 2018-03-31 NOTE — Progress Notes (Addendum)
Tristan Le C Tristan Le    [id number]    14-Feb-1941  Primary Care Physician:Tristan Le, [place]  Referring Physician: Alden HippEverhart, [name], [place] [address] [place], [place] [address]  Chief complaint: Epigastric/right upper quadrant abdominal pain  HPI: [age] yr M  Here for new patient visit with complaints of epigastric and RUQ abdominal pain.  He had an episode of severe right-sided abdominal pain, presented to urgent care and emergency room on February 12, 2018.  Was empirically treated with antibiotics for pneumonia.  And was also prescribed Carafate and Protonix.  Labs and imaging were negative for any acute pathology.  He was discharged home from the ER.  Patient has had on and off right-sided abdominal pain for many years but he feels its worse since January.  Thinks the pain has somewhat improved since he stopped drinking milk and is also avoiding red meat.  Gets severe pain whenever he eats beef or red meat. He was taking NSAIDs regularly before, Ibuprofen 2 tabs 3-4 times a week but stopped since Feb  Colonoscopy in 2017 found 3 polyps, report not available during this visit to review.  Per patient he had an episode of diverticulitis after colonoscopy, was treated with antibiotics.  He was not admitted to the hospital.   Outpatient Encounter Medications as of 03/31/2018  Medication Sig  . enalapril (VASOTEC) 10 MG tablet Take 10 mg by mouth daily.  . hydrochlorothiazide (MICROZIDE) 12.5 MG capsule Take 12.5 mg by mouth daily.  . pantoprazole (PROTONIX) 40 MG tablet Take 40 mg by mouth daily.  . ranitidine (ZANTAC) 300 MG tablet Take 150 mg by mouth at bedtime.   . sucralfate (CARAFATE) 1 GM/10ML suspension Take 10 mLs (1 g total) by mouth 4 (four) times daily -  with meals and at bedtime.  . [DISCONTINUED] omeprazole (PRILOSEC) 20 MG capsule Take 1 capsule (20 mg total) by mouth daily.   No facility-administered encounter medications on file as of 03/31/2018.      Allergies as of 03/31/2018 - Review Complete 03/31/2018  Allergen Reaction Noted  . Penicillins    . Sulfonamide derivatives      Past Medical History:  Diagnosis Date  . Arthritis   . Chest pain   . GERD (gastroesophageal reflux disease)   . Hyperlipidemia   . Hypertension     No past surgical history on file.  Family History  Problem Relation Age of Onset  . Heart disease Father   . Heart disease Brother   . Hyperlipidemia Brother     Social History   Socioeconomic History  . Marital status: Married    Spouse name: Not on file  . Number of children: 4  . Years of education: Not on file  . Highest education level: Not on file  Occupational History  . Occupation: Retired  Engineer, productionocial Needs  . Financial resource strain: Not on file  . Food insecurity:    Worry: Not on file    Inability: Not on file  . Transportation needs:    Medical: Not on file    Non-medical: Not on file  Tobacco Use  . Smoking status: Former Games developermoker  . Smokeless tobacco: Never Used  Substance and Sexual Activity  . Alcohol use: No  . Drug use: No  . Sexual activity: Not on file  Lifestyle  . Physical activity:    Days per week: Not on file    Minutes per session: Not on file  .  Stress: Not on file  Relationships  . Social connections:    Talks on phone: Not on file    Gets together: Not on file    Attends religious service: Not on file    Active member of club or organization: Not on file    Attends meetings of clubs or organizations: Not on file    Relationship status: Not on file  . Intimate partner violence:    Fear of current or ex partner: Not on file    Emotionally abused: Not on file    Physically abused: Not on file    Forced sexual activity: Not on file  Other Topics Concern  . Not on file  Social History Narrative  . Not on file      Review of systems: Review of Systems  Constitutional: Negative for fever and chills.  HENT: Positive for sinus problem  Eyes:  Negative for blurred vision.  Respiratory: Negative for cough, shortness of breath and wheezing.   Cardiovascular: Negative for chest pain and palpitations.  Gastrointestinal: as per HPI Genitourinary: Negative for dysuria, urgency, frequency and hematuria.  Musculoskeletal: Negative for myalgias, back pain and joint pain.  Skin: Negative for itching and rash.  Neurological: Negative for dizziness, tremors, focal weakness, seizures and loss of consciousness.  Endo/Heme/Allergies: Positive for seasonal allergies.  Psychiatric/Behavioral: Negative for depression, suicidal ideas and hallucinations.  All other systems reviewed and are negative.   Physical Exam: Vitals:   03/31/18 1020  BP: 110/66  Pulse: 60   Body mass index is 29.03 kg/m. Gen:      No acute distress HEENT:  EOMI, sclera anicteric Neck:     No masses; no thyromegaly Lungs:    Clear to auscultation bilaterally; normal respiratory effort CV:         Regular rate and rhythm; no murmurs Abd:      + bowel sounds; soft, non-tender; no palpable masses, no distension Ext:    No edema; adequate peripheral perfusion Skin:      Warm and dry; no rash Neuro: alert and oriented x 3 Psych: normal mood and affect  Data Reviewed:  Reviewed labs, radiology imaging, old records and pertinent past GI work up   Assessment and Plan/Recommendations:  77 year old male with history of hypertension, hyperlipidemia, colonic diverticulosis and an ?episode of diverticulitis last year here with complaints of intermittent epigastric and right upper quadrant abdominal pain CT abdomen pelvis negative for gallbladder disease or any acute pathology LFT and lipase are within normal limits He has history of chronic NSAID use We will schedule for EGD to evaluate, obtain gastric biopsies to exclude H. Pylori The risks and benefits as well as alternatives of endoscopic procedure(s) have been discussed and reviewed. All questions answered. The  patient agrees to proceed. Advised patient to avoid NSAIDs Continue Protonix 40 mg daily and ranitidine 150 mg at bedtime as needed Carafate before meals and bedtime as needed Discussed antireflux measures and lifestyle modifications    K. Scherry Ran , MD [phone] Mon-Fri 8a-5p [phone] after 5p, weekends, holidays  CC: Everhart, [place], [place]  Addendum: Received colonoscopy report from [facility] Colonoscopy September 06, 2015 by Dr. Lelon Huh polyps 4-7 mm in size removed from rectum and sigmoid colon, hyperplastic based on pathology report. No recall colonoscopy due to his age

## 2018-03-31 NOTE — Patient Instructions (Signed)
Normal BMI (Body Mass Index- based on height and weight) is between 23 and 30. Your BMI today is Body mass index is 29.03 kg/m. Marland Kitchen. Please consider follow up  regarding your BMI with your Primary Care Provider.  We have sent medications to your pharmacy for you to pick up at your convenience:  You have been scheduled for an endoscopy. Please follow written instructions given to you at your visit today. If you use inhalers (even only as needed), please bring them with you on the day of your procedure. Your physician has requested that you go to www.startemmi.com and enter the access code given to you at your visit today. This web site gives a general overview about your procedure. However, you should still follow specific instructions given to you by our office regarding your preparation for the procedure.

## 2018-04-02 ENCOUNTER — Encounter: Payer: Self-pay | Admitting: Gastroenterology

## 2018-04-02 ENCOUNTER — Ambulatory Visit (AMBULATORY_SURGERY_CENTER): Payer: Medicare HMO | Admitting: Gastroenterology

## 2018-04-02 ENCOUNTER — Other Ambulatory Visit: Payer: Self-pay

## 2018-04-02 VITALS — BP 117/95 | HR 78 | Temp 98.2°F | Resp 13 | Ht 73.0 in | Wt 220.0 lb

## 2018-04-02 DIAGNOSIS — K3189 Other diseases of stomach and duodenum: Secondary | ICD-10-CM

## 2018-04-02 DIAGNOSIS — R1013 Epigastric pain: Secondary | ICD-10-CM | POA: Diagnosis present

## 2018-04-02 MED ORDER — SODIUM CHLORIDE 0.9 % IV SOLN: 500.0000 mL | Freq: Once | INTRAVENOUS | Status: DC

## 2018-04-02 NOTE — Patient Instructions (Signed)
Impression/Recommendations:  Resume previous diet. Continue present medications. Await pathology results.  YOU HAD AN ENDOSCOPIC PROCEDURE TODAY AT THE [facility]:   Refer to the procedure report that was given to you for any specific questions about what was found during the examination.  If the procedure report does not answer your questions, please call your gastroenterologist to clarify.  If you requested that your care partner not be given the details of your procedure findings, then the procedure report has been included in a sealed envelope for you to review at your convenience later.  YOU SHOULD EXPECT: Some feelings of bloating in the abdomen. Passage of more gas than usual.  Walking can help get rid of the air that was put into your GI tract during the procedure and reduce the bloating. If you had a lower endoscopy (such as a colonoscopy or flexible sigmoidoscopy) you may notice spotting of blood in your stool or on the toilet paper. If you underwent a bowel prep for your procedure, you may not have a normal bowel movement for a few days.  Please Note:  You might notice some irritation and congestion in your nose or some drainage.  This is from the oxygen used during your procedure.  There is no need for concern and it should clear up in a day or so.  SYMPTOMS TO REPORT IMMEDIATELY:   Following upper endoscopy (EGD)  Vomiting of blood or coffee ground material  New chest pain or pain under the shoulder blades  Painful or persistently difficult swallowing  New shortness of breath  Fever of 100F or higher  Black, tarry-looking stools  For urgent or emergent issues, a gastroenterologist can be reached at any hour by calling [phone].   DIET:  We do recommend a small meal at first, but then you may proceed to your regular diet.  Drink plenty of fluids but you should avoid alcoholic beverages for 24 hours.  ACTIVITY:  You should plan to take it easy for the rest  of today and you should NOT DRIVE or use heavy machinery until tomorrow (because of the sedation medicines used during the test).    FOLLOW UP: Our staff will call the number listed on your records the next business day following your procedure to check on you and address any questions or concerns that you may have regarding the information given to you following your procedure. If we do not reach you, we will leave a message.  However, if you are feeling well and you are not experiencing any problems, there is no need to return our call.  We will assume that you have returned to your regular daily activities without incident.  If any biopsies were taken you will be contacted by phone or by letter within the next 1-3 weeks.  Please call us at [phone] if you have not heard about the biopsies in 3 weeks.    SIGNATURES/CONFIDENTIALITY: You and/or your care partner have signed paperwork which will be entered into your electronic medical record.  These signatures attest to the fact that that the information above on your After Visit Summary has been reviewed and is understood.  Full responsibility of the confidentiality of this discharge information lies with you and/or your care-partner. 

## 2018-04-02 NOTE — Progress Notes (Signed)
Called to room to assist during endoscopic procedure.  Patient ID and intended procedure confirmed with present staff. Received instructions for my participation in the procedure from the performing physician.  

## 2018-04-02 NOTE — Op Note (Signed)
[facility] Patient Name: Tristan Le Procedure Date: 04/02/2018 2:32 PM MRN: [id number] Endoscopist: Napoleon FormKavitha V. Chyrel Taha , MD Age: 77 Referring MD:  Date of Birth: 1941-05-23 Gender: Male Account #: [id number] Procedure:                Upper GI endoscopy Indications:              Epigastric abdominal pain Medicines:                Monitored Anesthesia Care Procedure:                Pre-Anesthesia Assessment:                           - Prior to the procedure, a History and Physical                            was performed, and patient medications and                            allergies were reviewed. The patient's tolerance of                            previous anesthesia was also reviewed. The risks                            and benefits of the procedure and the sedation                            options and risks were discussed with the patient.                            All questions were answered, and informed consent                            was obtained. Prior Anticoagulants: The patient has                            taken no previous anticoagulant or antiplatelet                            agents. ASA Grade Assessment: II - A patient with                            mild systemic disease. After reviewing the risks                            and benefits, the patient was deemed in                            satisfactory condition to undergo the procedure.                           After obtaining informed consent, the endoscope was  passed under direct vision. Throughout the                            procedure, the patient's blood pressure, pulse, and                            oxygen saturations were monitored continuously. The                            Endoscope was introduced through the mouth, and                            advanced to the second part of duodenum. The upper                            GI endoscopy was  accomplished without difficulty.                            The patient tolerated the procedure well. Scope In: Scope Out: Findings:                 The esophagus was normal.                           The stomach was normal.                           Diffuse mildly scalloped mucosa was found in the                            duodenal bulb and in the second portion of the                            duodenum. Biopsies for histology were taken with a                            cold forceps for evaluation of celiac disease.                           The exam was otherwise without abnormality. Complications:            No immediate complications. Estimated Blood Loss:     Estimated blood loss was minimal. Impression:               - Normal esophagus.                           - Normal stomach.                           - Scalloped mucosa was found in the duodenum,                            suspicious for celiac disease. Biopsied.                           -  The examination was otherwise normal. Recommendation:           - Patient has a contact number available for                            emergencies. The signs and symptoms of potential                            delayed complications were discussed with the                            patient. Return to normal activities tomorrow.                            Written discharge instructions were provided to the                            patient.                           - Resume previous diet.                           - Continue present medications.                           - Await pathology results. Napoleon Form, MD 04/02/2018 2:53:51 PM This report has been signed electronically.

## 2018-04-05 ENCOUNTER — Telehealth: Payer: Self-pay

## 2018-04-05 NOTE — Telephone Encounter (Signed)
  Follow up Call-  Call Samika Vetsch number 04/02/2018  Post procedure Call Marivel Mcclarty phone  # [phone]  Permission to leave phone message Yes  Some recent data might be hidden     Patient questions:  Do you have a fever, pain , or abdominal swelling? No. Pain Score  0 *  Have you tolerated food without any problems? Yes.    Have you been able to return to your normal activities? Yes.    Do you have any questions about your discharge instructions: Diet   No. Medications  No. Follow up visit  No.  Do you have questions or concerns about your Care? No.  Actions: * If pain score is 4 or above: No action needed, pain <4.

## 2018-04-08 ENCOUNTER — Other Ambulatory Visit: Payer: Self-pay

## 2018-04-08 DIAGNOSIS — K639 Disease of intestine, unspecified: Secondary | ICD-10-CM

## 2018-04-08 DIAGNOSIS — R1013 Epigastric pain: Secondary | ICD-10-CM

## 2018-04-09 ENCOUNTER — Other Ambulatory Visit (INDEPENDENT_AMBULATORY_CARE_PROVIDER_SITE_OTHER): Payer: Medicare HMO

## 2018-04-09 DIAGNOSIS — K639 Disease of intestine, unspecified: Secondary | ICD-10-CM

## 2018-04-09 DIAGNOSIS — R1013 Epigastric pain: Secondary | ICD-10-CM | POA: Diagnosis not present

## 2018-04-09 LAB — IGA: IGA: 263 mg/dL (ref 68–378)

## 2018-04-12 LAB — TISSUE TRANSGLUTAMINASE, IGA: (TTG) AB, IGA: 1 U/mL

## 2018-12-06 ENCOUNTER — Other Ambulatory Visit: Payer: Self-pay | Admitting: General Practice

## 2018-12-06 ENCOUNTER — Ambulatory Visit
Admission: RE | Admit: 2018-12-06 | Discharge: 2018-12-06 | Disposition: A | Discharge status: Home / Self Care | Payer: Medicare HMO | Source: Ambulatory Visit | Attending: General Practice | Admitting: General Practice

## 2018-12-06 DIAGNOSIS — M542 Cervicalgia: Secondary | ICD-10-CM

## 2019-04-13 ENCOUNTER — Other Ambulatory Visit: Payer: Self-pay

## 2019-04-13 ENCOUNTER — Emergency Department (HOSPITAL_COMMUNITY)
Admission: EM | Admit: 2019-04-13 | Discharge: 2019-04-13 | Disposition: A | Discharge status: Home / Self Care | Payer: Medicare HMO | Attending: Emergency Medicine | Admitting: Emergency Medicine

## 2019-04-13 ENCOUNTER — Encounter (HOSPITAL_COMMUNITY): Payer: Self-pay

## 2019-04-13 DIAGNOSIS — I10 Essential (primary) hypertension: Secondary | ICD-10-CM | POA: Diagnosis not present

## 2019-04-13 DIAGNOSIS — Z79899 Other long term (current) drug therapy: Secondary | ICD-10-CM | POA: Diagnosis not present

## 2019-04-13 DIAGNOSIS — R42 Dizziness and giddiness: Secondary | ICD-10-CM | POA: Diagnosis present

## 2019-04-13 DIAGNOSIS — F17210 Nicotine dependence, cigarettes, uncomplicated: Secondary | ICD-10-CM | POA: Diagnosis not present

## 2019-04-13 LAB — COMPREHENSIVE METABOLIC PANEL
ALT: 30 U/L (ref 0–44)
AST: 30 U/L (ref 15–41)
Albumin: 3.9 g/dL (ref 3.5–5.0)
Alkaline Phosphatase: 62 U/L (ref 38–126)
Anion gap: 11 (ref 5–15)
BUN: 21 mg/dL (ref 8–23)
CO2: 25 mmol/L (ref 22–32)
Calcium: 9.7 mg/dL (ref 8.9–10.3)
Chloride: 101 mmol/L (ref 98–111)
Creatinine, Ser: 1.18 mg/dL (ref 0.61–1.24)
GFR calc Af Amer: >60 mL/min (ref >60)
GFR calc non Af Amer: 60 mL/min — ABNORMAL LOW (ref >60)
Glucose, Bld: 128 mg/dL — ABNORMAL HIGH (ref 70–99)
Potassium: 4.2 mmol/L (ref 3.5–5.1)
Sodium: 137 mmol/L (ref 135–145)
Total Bilirubin: 0.6 mg/dL (ref 0.3–1.2)
Total Protein: 6.8 g/dL (ref 6.5–8.1)

## 2019-04-13 LAB — CBC WITH DIFFERENTIAL/PLATELET
Abs Immature Granulocytes: 0.02 10*3/uL (ref 0.00–0.07)
Basophils Absolute: 0 10*3/uL (ref 0.0–0.1)
Basophils Relative: 1 %
Eosinophils Absolute: 0.3 10*3/uL (ref 0.0–0.5)
Eosinophils Relative: 4 %
HCT: 46.2 % (ref 39.0–52.0)
Hemoglobin: 15.6 g/dL (ref 13.0–17.0)
Immature Granulocytes: 0 %
Lymphocytes Relative: 33 %
Lymphs Abs: 2.5 10*3/uL (ref 0.7–4.0)
MCH: 34.2 pg — ABNORMAL HIGH (ref 26.0–34.0)
MCHC: 33.8 g/dL (ref 30.0–36.0)
MCV: 101.3 fL — ABNORMAL HIGH (ref 80.0–100.0)
Monocytes Absolute: 0.8 10*3/uL (ref 0.1–1.0)
Monocytes Relative: 11 %
Neutro Abs: 3.8 10*3/uL (ref 1.7–7.7)
Neutrophils Relative %: 51 %
Platelets: 166 10*3/uL (ref 150–400)
RBC: 4.56 MIL/uL (ref 4.22–5.81)
RDW: 12.3 % (ref 11.5–15.5)
WBC: 7.5 10*3/uL (ref 4.0–10.5)
nRBC: 0 % (ref 0.0–0.2)

## 2019-04-13 LAB — PROTIME-INR
INR: 1 (ref 0.8–1.2)
Prothrombin Time: 12.7 seconds (ref 11.4–15.2)

## 2019-04-13 MED ORDER — SODIUM CHLORIDE 0.9 % IV BOLUS
1000.0000 mL | Freq: Once | INTRAVENOUS | Status: AC
  Administered 2019-04-13: 1000 mL via INTRAVENOUS

## 2019-04-13 NOTE — ED Triage Notes (Signed)
Per [facility], pt from home w/ a c/o dizziness/lightheadedness while standing. No LOC. Pt denies head pain. Stroke scale negative. No chest pain. Pt denies pain. No recent hx of injury or illness. No N/V/D. CAOx4.  Orthostatics noted that the pt's heart rate increased by 20 bpm during change of position (sitting to standing) but not change in BP.   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

## 2019-04-13 NOTE — ED Notes (Signed)
Pt reports that he has been feeling different ever since he started taking 4000 IUs of Omega 3s. He began taking the medicine on 4/13. A small bruise has developed on his left wrist and has felt light headed.

## 2019-04-13 NOTE — Discharge Instructions (Signed)
Your work-up in the emergency department today has been reassuring.  We recommend that you continue your daily medications and follow-up with your primary care doctor.  Try and increase your water intake; limit your salt consumption. You may return for new or concerning symptoms.

## 2019-04-13 NOTE — ED Notes (Addendum)
Patient verbalizes understanding of discharge instructions. Opportunity for questioning and answers were provided. Armband removed by staff, pt discharged from ED home via POV with family.   Pt refused a wheelchair.

## 2019-04-13 NOTE — ED Provider Notes (Signed)
[facility] EMERGENCY DEPARTMENT Provider Note   CSN: [id number] Arrival date & time: 04/13/19  0055    History   Chief Complaint Chief Complaint  Patient presents with   Dizziness    HPI Tristan Le is a 78 y.o. male.    78 year old male with history of hypertension, dyslipidemia, esophageal reflux, arthritis presents to the emergency department for an episode of dizziness.  He reports that he was sleeping in his recliner when he awoke and rose to a standing position.  He felt dizzy characterized as "being off balance".  This lasted for approximately 20 minutes before subsiding.  He did take his blood pressure and reports a systolic BP in the 180s.  He is presently asymptomatic with normal blood pressure.  His son-in-law is a Engineer, structural and came to his home to assess him.  He was noted to have an increased heart rate with orthostatic testing, but stable blood pressure.  EKG in the field showed borderline first-degree heart block and patient was encouraged to come to the ED for evaluation.  He denies associated or preceding fever, chest pain, shortness of breath, N/V/D, vision loss, extremity numbness or paresthesias, extremity weakness.  The patient is not on chronic anticoagulation.  Was started on prescription grade fish oil 3 days ago; otherwise no new medication changes.  Patient lives in a house with his wife.  The history is provided by the patient. No language interpreter was used.    Past Medical History:  Diagnosis Date   Arthritis    Chest pain    GERD (gastroesophageal reflux disease)    Hyperlipidemia    Hypertension     Patient Active Problem List   Diagnosis Date Noted   Chest pain    Arthritis     History reviewed. No pertinent surgical history.      Home Medications    Prior to Admission medications   Medication Sig Start Date End Date Taking? Authorizing Provider  enalapril (VASOTEC) 10 MG tablet Take 10 mg by mouth daily.     [provider]  hydrochlorothiazide (MICROZIDE) 12.5 MG capsule Take 12.5 mg by mouth daily.    [provider]  pantoprazole (PROTONIX) 40 MG tablet Take 1 tablet (40 mg total) by mouth daily. 03/31/18   Napoleon Form, MD  ranitidine (ZANTAC) 300 MG tablet Take 0.5 tablets (150 mg total) by mouth at bedtime. 03/31/18   Napoleon Form, MD  sucralfate (CARAFATE) 1 GM/10ML suspension Take 10 mLs (1 g total) by mouth 4 (four) times daily as needed. 03/31/18   Napoleon Form, MD    Family History Family History  Problem Relation Age of Onset   Heart disease Father    Heart disease Brother    Hyperlipidemia Brother     Social History Social History   Tobacco Use   Smoking status: Current Every Day Smoker    Packs/day: 0.50    Years: 60.00    Pack years: 30.00    Types: Cigarettes   Smokeless tobacco: Never Used  Substance Use Topics   Alcohol use: No   Drug use: No     Allergies   Penicillins and Sulfonamide derivatives   Review of Systems Review of Systems Ten systems reviewed and are negative for acute change, except as noted in the HPI.    Physical Exam Updated Vital Signs BP 125/69    Pulse 72    Temp 99.1 F (37.3 C) (Oral)  Resp 16    Ht 6\' 1"  (1.854 m)    Wt 99.8 kg    SpO2 96%    BMI 29.03 kg/m   Physical Exam Vitals signs and nursing note reviewed.  Constitutional:      General: He is not in acute distress.    Appearance: He is well-developed. He is not diaphoretic.     Comments: Nontoxic appearing and in NAD  HENT:     Head: Normocephalic and atraumatic.  Eyes:     General: No scleral icterus.    Conjunctiva/sclera: Conjunctivae normal.  Neck:     Musculoskeletal: Normal range of motion.  Cardiovascular:     Rate and Rhythm: Normal rate and regular rhythm.     Pulses: Normal pulses.  Pulmonary:     Effort: Pulmonary effort is normal. No respiratory distress.     Breath sounds: No stridor. No wheezing or  rales.     Comments: Respirations even and unlabored. Lungs CTAB. Musculoskeletal: Normal range of motion.     Comments: No lower extremity edema.  Skin:    General: Skin is warm and dry.     Coloration: Skin is not pale.     Findings: No erythema or rash.  Neurological:     General: No focal deficit present.     Mental Status: He is alert and oriented to person, place, and time.     Coordination: Coordination normal.     Comments: GCS 15. Speech is goal oriented. No cranial nerve deficits appreciated; symmetric eyebrow raise, no facial drooping, tongue midline. Patient has equal grip strength bilaterally with 5/5 strength against resistance in all major muscle groups bilaterally. Sensation to light touch intact. Patient moves extremities without ataxia. No pronator drift. Patient ambulatory with steady gait.  Psychiatric:        Behavior: Behavior normal.      ED Treatments / Results  Labs (all labs ordered are listed, but only abnormal results are displayed) Labs Reviewed  CBC WITH DIFFERENTIAL/PLATELET - Abnormal; Notable for the following components:      Result Value   MCV 101.3 (*)    MCH 34.2 (*)    All other components within normal limits  COMPREHENSIVE METABOLIC PANEL - Abnormal; Notable for the following components:   Glucose, Bld 128 (*)    GFR calc non Af Amer 60 (*)    All other components within normal limits  PROTIME-INR    EKG EKG Interpretation  Date/Time:  Wednesday April 13 2019 01:00:03 EDT Ventricular Rate:  78 PR Interval:    QRS Duration: 94 QT Interval:  379 QTC Calculation: 432 R Axis:   -9 Text Interpretation:  Sinus rhythm Probable left atrial enlargement RSR' in V1 or V2, right VCD or RVH Confirmed by Marily Memos [id number] on 04/13/2019 2:56:27 AM   Radiology No results found.  Procedures Procedures (including critical care time)  Medications Ordered in ED Medications  sodium chloride 0.9 % bolus 1,000 mL (0 mLs Intravenous Stopped  04/13/19 0305)     Initial Impression / Assessment and Plan / ED Course  I have reviewed the triage vital signs and the nursing notes.  Pertinent labs & imaging results that were available during my care of the patient were reviewed by me and considered in my medical decision making (see chart for details).        78 year old male presents to the emergency department for complaints of a 20-minute episode of dizziness.  Symptoms spontaneously subsided prior to ED  arrival.  Was advised to come by EMS after he was noted to have borderline type I heart block on rhythm strip.  Patient alert, nontoxic on initial assessment.  Neurologic exam noted to be nonfocal.  His EKG today is reassuring.  No evidence of heart block or acute ischemic change.  Labs stable with prior evaluations.  No anemia, electrolyte derangements.  Patient has been observed in the emergency department for over 2 hours without clinical decompensation.  He has not had any recurrence of his dizziness.  He reports a blood pressure of 180 systolic with home BP recorder.  It is possible that he may have been experiencing episodic hypertension at the time of symptom onset.  His blood pressure has been stable and normal since ED arrival.  Have counseled on limited salt intake.  We will continue home medications as prescribed.  It was encouraged that the patient follow-up with his primary care doctor.  Return precautions discussed and provided. Patient discharged in stable condition with no unaddressed concerns.  Vitals:   04/13/19 0230 04/13/19 0245 04/13/19 0300 04/13/19 0315  BP: 129/70 130/69 136/80 125/69  Pulse: 73 76 76 72  Resp: 17 16 18 16   Temp:      TempSrc:      SpO2: 97% 96% 96% 96%  Weight:      Height:        Final Clinical Impressions(s) / ED Diagnoses   Final diagnoses:  Spell of dizziness    ED Discharge Orders    None       Antony MaduraHumes, Ayumi Wangerin, PA-C 04/13/19 0351    Mesner, Barbara CowerJason, MD 04/13/19 [phone]

## 2019-05-06 ENCOUNTER — Emergency Department (HOSPITAL_COMMUNITY)
Admission: EM | Admit: 2019-05-06 | Discharge: 2019-05-06 | Disposition: A | Discharge status: Home / Self Care | Payer: Medicare HMO | Attending: Emergency Medicine | Admitting: Emergency Medicine

## 2019-05-06 ENCOUNTER — Emergency Department (HOSPITAL_COMMUNITY): Payer: Medicare HMO

## 2019-05-06 ENCOUNTER — Other Ambulatory Visit: Payer: Self-pay

## 2019-05-06 ENCOUNTER — Encounter (HOSPITAL_COMMUNITY): Payer: Self-pay | Admitting: Emergency Medicine

## 2019-05-06 DIAGNOSIS — I10 Essential (primary) hypertension: Secondary | ICD-10-CM | POA: Insufficient documentation

## 2019-05-06 DIAGNOSIS — R062 Wheezing: Secondary | ICD-10-CM | POA: Diagnosis not present

## 2019-05-06 DIAGNOSIS — J441 Chronic obstructive pulmonary disease with (acute) exacerbation: Secondary | ICD-10-CM | POA: Diagnosis not present

## 2019-05-06 DIAGNOSIS — R0602 Shortness of breath: Secondary | ICD-10-CM

## 2019-05-06 DIAGNOSIS — F17210 Nicotine dependence, cigarettes, uncomplicated: Secondary | ICD-10-CM | POA: Insufficient documentation

## 2019-05-06 DIAGNOSIS — Z79899 Other long term (current) drug therapy: Secondary | ICD-10-CM | POA: Insufficient documentation

## 2019-05-06 LAB — CBC WITH DIFFERENTIAL/PLATELET
Abs Immature Granulocytes: 0.04 10*3/uL (ref 0.00–0.07)
Basophils Absolute: 0.1 10*3/uL (ref 0.0–0.1)
Basophils Relative: 1 %
Eosinophils Absolute: 0.7 10*3/uL — ABNORMAL HIGH (ref 0.0–0.5)
Eosinophils Relative: 7 %
HCT: 50.5 % (ref 39.0–52.0)
Hemoglobin: 16.9 g/dL (ref 13.0–17.0)
Immature Granulocytes: 0 %
Lymphocytes Relative: 31 %
Lymphs Abs: 3.3 10*3/uL (ref 0.7–4.0)
MCH: 34.1 pg — ABNORMAL HIGH (ref 26.0–34.0)
MCHC: 33.5 g/dL (ref 30.0–36.0)
MCV: 101.8 fL — ABNORMAL HIGH (ref 80.0–100.0)
Monocytes Absolute: 0.9 10*3/uL (ref 0.1–1.0)
Monocytes Relative: 8 %
Neutro Abs: 5.6 10*3/uL (ref 1.7–7.7)
Neutrophils Relative %: 53 %
Platelets: 202 10*3/uL (ref 150–400)
RBC: 4.96 MIL/uL (ref 4.22–5.81)
RDW: 12.1 % (ref 11.5–15.5)
WBC: 10.7 10*3/uL — ABNORMAL HIGH (ref 4.0–10.5)
nRBC: 0 % (ref 0.0–0.2)

## 2019-05-06 LAB — BASIC METABOLIC PANEL
Anion gap: 11 (ref 5–15)
BUN: 18 mg/dL (ref 8–23)
CO2: 24 mmol/L (ref 22–32)
Calcium: 9.5 mg/dL (ref 8.9–10.3)
Chloride: 101 mmol/L (ref 98–111)
Creatinine, Ser: 1.09 mg/dL (ref 0.61–1.24)
GFR calc Af Amer: >60 mL/min (ref >60)
GFR calc non Af Amer: >60 mL/min (ref >60)
Glucose, Bld: 177 mg/dL — ABNORMAL HIGH (ref 70–99)
Potassium: 3.8 mmol/L (ref 3.5–5.1)
Sodium: 136 mmol/L (ref 135–145)

## 2019-05-06 MED ORDER — ALBUTEROL SULFATE HFA 108 (90 BASE) MCG/ACT IN AERS
6.0000 | INHALATION_SPRAY | Freq: Once | RESPIRATORY_TRACT | Status: AC
  Administered 2019-05-06: 06:00:00 8 via RESPIRATORY_TRACT
  Filled 2019-05-06: qty 6.7

## 2019-05-06 MED ORDER — IPRATROPIUM BROMIDE HFA 17 MCG/ACT IN AERS
3.0000 | INHALATION_SPRAY | Freq: Once | RESPIRATORY_TRACT | Status: AC
  Administered 2019-05-06: 08:00:00 3 via RESPIRATORY_TRACT
  Filled 2019-05-06: qty 12.9

## 2019-05-06 MED ORDER — PREDNISONE 20 MG PO TABS: 40.0000 mg | ORAL_TABLET | Freq: Every day | ORAL | 0 refills | Status: AC

## 2019-05-06 MED ORDER — ALBUTEROL SULFATE HFA 108 (90 BASE) MCG/ACT IN AERS
4.0000 | INHALATION_SPRAY | Freq: Once | RESPIRATORY_TRACT | Status: AC
  Administered 2019-05-06: 4 via RESPIRATORY_TRACT

## 2019-05-06 MED ORDER — PREDNISONE 20 MG PO TABS
40.0000 mg | ORAL_TABLET | Freq: Once | ORAL | Status: AC
  Administered 2019-05-06: 40 mg via ORAL
  Filled 2019-05-06: qty 2

## 2019-05-06 NOTE — ED Provider Notes (Addendum)
[facility] EMERGENCY DEPARTMENT Provider Note   CSN: [id number] Arrival date & time: 05/06/19  0516    History   Chief Complaint Chief Complaint  Patient presents with  . Shortness of Breath    Cough/Wheezing    HPI Tristan Le is a 78 y.o. male with a history hypertension hyperlipidemia presenting to the ED with shortness of breath.  He states about 3 weeks ago he started having severe congestion that he attributed to allergies.  However when this persisted he was concerned that he may have bronchitis.  He called his PCP who gave him a short-term course of azithromycin.  He states after 1 day of antibiotics his symptoms improved significantly.  However on Wednesday he noticed he started having significant shortness of breath.  He has had some productive cough with white to green phlegm. States he has a tender feeling in his throat and abdominal muscle strain from coughing. He denies any fevers, chills, headaches, chest pain/pressure, sinus pain or pressure nausea vomiting, abdominal pain, decreased smell or taste, body aches, sore throat, ear pain, eye itchiness or watering.  Denies any recent sick contacts.  He has been trying to avoid going out but has been going to the grocery store.  States he has been told he has emphysema and uses an albuterol inhaler at home which he does not think helps his breathing.  States he has been smoking half pack cigarettes a day for the past 60+ years.  Never required supplemental oxygen.   HPI  Past Medical History:  Diagnosis Date  . Arthritis   . Chest pain   . GERD (gastroesophageal reflux disease)   . Hyperlipidemia   . Hypertension     Patient Active Problem List   Diagnosis Date Noted  . Chest pain   . Arthritis     History reviewed. No pertinent surgical history.      Home Medications    Prior to Admission medications   Medication Sig Start Date End Date Taking? Authorizing Provider  albuterol (VENTOLIN  HFA) 108 (90 Base) MCG/ACT inhaler Inhale 1-2 puffs into the lungs daily as needed for shortness of breath. 08/31/18  Yes [provider]  azithromycin (ZITHROMAX) 250 MG tablet Take 250-500 mg by mouth See admin instructions. Take 500 mg on the first day and 250 mg for the next 4 05/03/19  Yes [provider]  enalapril (VASOTEC) 10 MG tablet Take 10 mg by mouth daily.   Yes [provider]  hydrochlorothiazide (MICROZIDE) 12.5 MG capsule Take 12.5 mg by mouth daily.   Yes [provider]  omega-3 acid ethyl esters (LOVAZA) 1 g capsule Take 2 capsules by mouth daily. 04/05/19  Yes [provider]  pantoprazole (PROTONIX) 40 MG tablet Take 1 tablet (40 mg total) by mouth daily. Patient taking differently: Take 40 mg by mouth daily as needed.  03/31/18  Yes Nandigam, Eleonore Chiquito, MD  ranitidine (ZANTAC) 300 MG tablet Take 0.5 tablets (150 mg total) by mouth at bedtime. Patient not taking: Reported on 05/06/2019 03/31/18   Napoleon Form, MD  sucralfate (CARAFATE) 1 GM/10ML suspension Take 10 mLs (1 g total) by mouth 4 (four) times daily as needed. Patient not taking: Reported on 05/06/2019 03/31/18   Napoleon Form, MD    Family History Family History  Problem Relation Age of Onset  . Heart disease Father   . Heart disease Brother   . Hyperlipidemia Brother     Social History  Social History   Tobacco Use  . Smoking status: Current Every Day Smoker    Packs/day: 0.50    Years: 60.00    Pack years: 30.00    Types: Cigarettes  . Smokeless tobacco: Never Used  Substance Use Topics  . Alcohol use: No  . Drug use: No     Allergies   Penicillins and Sulfonamide derivatives   Review of Systems Review of Systems  Constitutional: Negative for chills, diaphoresis, fatigue and fever.  HENT: Positive for congestion. Negative for ear discharge, ear pain, postnasal drip, rhinorrhea, sinus pressure, sinus pain, sneezing and sore throat.   Eyes:  Negative for pain and itching.  Respiratory: Positive for cough, shortness of breath and wheezing. Negative for chest tightness.   Cardiovascular: Negative for chest pain.  Gastrointestinal: Negative for abdominal pain, nausea and vomiting.  Genitourinary: Negative for dysuria and hematuria.  Musculoskeletal: Negative for arthralgias, back pain, joint swelling and myalgias.  Neurological: Negative for dizziness, weakness, light-headedness and headaches.     Physical Exam Updated Vital Signs BP 113/65   Pulse 98   Temp 98.2 F (36.8 C) (Oral)   Resp 17   SpO2 95%   Physical Exam Vitals signs reviewed.  Constitutional:      Appearance: He is well-developed.     Comments: Short of breath on exam  HENT:     Head: Normocephalic and atraumatic.  Neck:     Musculoskeletal: Neck supple.  Cardiovascular:     Rate and Rhythm: Normal rate and regular rhythm.     Pulses: Normal pulses.     Heart sounds: Normal heart sounds.  Pulmonary:     Breath sounds: Examination of the right-upper field reveals wheezing. Examination of the left-upper field reveals wheezing. Examination of the right-middle field reveals wheezing. Examination of the left-middle field reveals wheezing. Examination of the right-lower field reveals wheezing, rhonchi and rales. Examination of the left-lower field reveals wheezing, rhonchi and rales. Wheezing, rhonchi and rales present.     Comments: SOB on exam Chest:     Chest wall: No tenderness.  Abdominal:     General: Bowel sounds are normal.     Palpations: Abdomen is soft.  Musculoskeletal:     Right lower leg: No edema.     Left lower leg: No edema.  Lymphadenopathy:     Cervical: No cervical adenopathy.  Skin:    General: Skin is warm and dry.  Neurological:     Mental Status: He is alert and oriented to person, place, and time.  Psychiatric:        Mood and Affect: Mood normal.        Behavior: Behavior normal.      ED Treatments / Results  Labs  (all labs ordered are listed, but only abnormal results are displayed) Labs Reviewed  BASIC METABOLIC PANEL - Abnormal; Notable for the following components:      Result Value   Glucose, Bld 177 (*)    All other components within normal limits  CBC WITH DIFFERENTIAL/PLATELET - Abnormal; Notable for the following components:   WBC 10.7 (*)    MCV 101.8 (*)    MCH 34.1 (*)    Eosinophils Absolute 0.7 (*)    All other components within normal limits    EKG EKG Interpretation  Date/Time:  Friday May 06 2019 05:24:49 EDT Ventricular Rate:  103 PR Interval:    QRS Duration: 93 QT Interval:  335 QTC Calculation: 441 R Axis:   85  Text Interpretation:  Sinus tachycardia Borderline right axis deviation Abnormal R-wave progression, early transition motion artifact Otherwise no significant change Confirmed by Cardama, Pedro [phone]([id number]) on 05/06/2019 5:31:50 AM   Radiology Dg ChesDrema Pryt 2 View  Result Date: 05/06/2019 CLINICAL DATA:  Shortness of breath EXAM: CHEST - 2 VIEW COMPARISON:  08/08/2016 FINDINGS: Normal heart size and stable mediastinal contours. Calcified right hilar lymph nodes. There is no edema, consolidation, effusion, or pneumothorax. Mild coarsening of lung markings which is stable and likely scarring. IMPRESSION: Stable from prior.  No evidence of acute disease. Electronically Signed   By: Marnee SpringJonathon  Watts M.D.   On: 05/06/2019 06:27    Procedures Procedures (including critical care time)  Medications Ordered in ED Medications  ipratropium (ATROVENT HFA) inhaler 3 puff (has no administration in time range)  albuterol (VENTOLIN HFA) 108 (90 Base) MCG/ACT inhaler 6-8 puff (8 puffs Inhalation Given 05/06/19 0604)  albuterol (VENTOLIN HFA) 108 (90 Base) MCG/ACT inhaler 4 puff (4 puffs Inhalation Given 05/06/19 0701)  predniSONE (DELTASONE) tablet 40 mg (40 mg Oral Given 05/06/19 0701)     Initial Impression / Assessment and Plan / ED Course  I have reviewed the triage vital signs and  the nursing notes.  Pertinent labs & imaging results that were available during my care of the patient were reviewed by me and considered in my medical decision making (see chart for details).  Patient is a 78 year old man with history of hypertension and hyperlipidemia presenting to the ED with shortness of breath, productive cough and wheezing.  He was recently given a 5-day course of azithromycin for presumed bronchitis due to 3 weeks of congestion and cough.  He states after 1 day his congestion improved significantly however on Wednesday he had worsening shortness of breath, wheezing and ongoing cough.  Patient states that he has been smoking half a pack of cigarettes for 60+ years.  On arrival ED he was afebrile, hypertensive and mildly tachycardic.  Saturating well on room air at 97%.  EKG showed sinus tachycardia.  On lung exam appreciated diffuse wheezing on all lung fields and bilateral lower lung rhonchi/rales.  Given patient's smoking history and presentation this is likely a COPD exacerbation.  Will get a chest x-ray to rule out any signs of infection or pneumonia.  Ordered albuterol and Atrovent inhaler treatment.   BMP and CBC unremarkable. WBC 10.7, borderline. Chest xray did not show evidence of acute disease. No edema, consolidation, effusion or pneumothorax. No s/sx of infection or viral illness. He had some improvement with albuterol inhaler treatment. Also started him on prednisone 40 mg, will plan for a total of 5 days.   Will monitor patient and make sure breathing continues to improve. Waiting to receive atrovent inhaler treatment. If breathing improves will discharge home to continue prednisone 40 mg for another 4 days and to continue albuterol 4 puffs every 4-6 hours and to complete azithromycin course.    Final Clinical Impressions(s) / ED Diagnoses   Final diagnoses:  SOB (shortness of breath)  COPD exacerbation [facility](HCC)    ED Discharge Orders    None       Olivier Frayre,  Rosbel Buckner N, DO 05/06/19 0711    Kynnedy Carreno N, DO 05/06/19 [id number]    Nira Connardama, Pedro Eduardo, MD 05/06/19 [phone]

## 2019-05-06 NOTE — ED Notes (Signed)
Patient verbalizes understanding of discharge instructions. Opportunity for questioning and answers were provided. Armband removed by staff, pt discharged from ED.  

## 2019-05-06 NOTE — ED Notes (Signed)
Patient transported to X-ray 

## 2019-05-06 NOTE — ED Triage Notes (Addendum)
Patient reports worsening SOB with productive cough/wheezing and chest congestion for several days worse yesterday , currently taking Zithromax for bronchitis with no relief/no improvement  , denies fever or chills .

## 2019-05-06 NOTE — Discharge Instructions (Addendum)
You presented to the ED with shortness of breath, wheezing and cough. Your chest xray and blood work looked normal. This is most likely due to a COPD exacerbation. You were treated with breathing treatments and prednisone.   I want you to continue to take prednisone 40 mg once a day for four more days starting tomorrow 5/9. I also want you to continue to use your albuterol inhaler 4 puffs every 4-6 hours as needed. I also want you to complete your course of azithromycin.

## 2019-05-14 ENCOUNTER — Emergency Department (HOSPITAL_COMMUNITY)
Admission: EM | Admit: 2019-05-14 | Discharge: 2019-05-14 | Disposition: A | Discharge status: Home / Self Care | Payer: Medicare HMO | Attending: Emergency Medicine | Admitting: Emergency Medicine

## 2019-05-14 ENCOUNTER — Encounter (HOSPITAL_COMMUNITY): Payer: Self-pay | Admitting: Emergency Medicine

## 2019-05-14 ENCOUNTER — Other Ambulatory Visit: Payer: Self-pay

## 2019-05-14 ENCOUNTER — Emergency Department (HOSPITAL_COMMUNITY): Payer: Medicare HMO

## 2019-05-14 DIAGNOSIS — J40 Bronchitis, not specified as acute or chronic: Secondary | ICD-10-CM | POA: Diagnosis not present

## 2019-05-14 DIAGNOSIS — J441 Chronic obstructive pulmonary disease with (acute) exacerbation: Secondary | ICD-10-CM | POA: Diagnosis not present

## 2019-05-14 DIAGNOSIS — R05 Cough: Secondary | ICD-10-CM | POA: Insufficient documentation

## 2019-05-14 DIAGNOSIS — Z1159 Encounter for screening for other viral diseases: Secondary | ICD-10-CM | POA: Insufficient documentation

## 2019-05-14 DIAGNOSIS — I10 Essential (primary) hypertension: Secondary | ICD-10-CM | POA: Insufficient documentation

## 2019-05-14 DIAGNOSIS — F17210 Nicotine dependence, cigarettes, uncomplicated: Secondary | ICD-10-CM | POA: Diagnosis not present

## 2019-05-14 DIAGNOSIS — Z79899 Other long term (current) drug therapy: Secondary | ICD-10-CM | POA: Insufficient documentation

## 2019-05-14 DIAGNOSIS — R0602 Shortness of breath: Secondary | ICD-10-CM | POA: Diagnosis present

## 2019-05-14 LAB — CBC
HCT: 49.5 % (ref 39.0–52.0)
Hemoglobin: 17.2 g/dL — ABNORMAL HIGH (ref 13.0–17.0)
MCH: 34.3 pg — ABNORMAL HIGH (ref 26.0–34.0)
MCHC: 34.7 g/dL (ref 30.0–36.0)
MCV: 98.8 fL (ref 80.0–100.0)
Platelets: 221 10*3/uL (ref 150–400)
RBC: 5.01 MIL/uL (ref 4.22–5.81)
RDW: 11.9 % (ref 11.5–15.5)
WBC: 14 10*3/uL — ABNORMAL HIGH (ref 4.0–10.5)
nRBC: 0 % (ref 0.0–0.2)

## 2019-05-14 LAB — BASIC METABOLIC PANEL
Anion gap: 15 (ref 5–15)
BUN: 23 mg/dL (ref 8–23)
CO2: 21 mmol/L — ABNORMAL LOW (ref 22–32)
Calcium: 9.5 mg/dL (ref 8.9–10.3)
Chloride: 98 mmol/L (ref 98–111)
Creatinine, Ser: 1.22 mg/dL (ref 0.61–1.24)
GFR calc Af Amer: >60 mL/min (ref >60)
GFR calc non Af Amer: 57 mL/min — ABNORMAL LOW (ref >60)
Glucose, Bld: 159 mg/dL — ABNORMAL HIGH (ref 70–99)
Potassium: 4.2 mmol/L (ref 3.5–5.1)
Sodium: 134 mmol/L — ABNORMAL LOW (ref 135–145)

## 2019-05-14 LAB — SARS CORONAVIRUS 2 BY RT PCR (HOSPITAL ORDER, PERFORMED IN ~~LOC~~ HOSPITAL LAB): SARS Coronavirus 2: NEGATIVE

## 2019-05-14 MED ORDER — DOXYCYCLINE HYCLATE 100 MG PO CAPS: 100.0000 mg | ORAL_CAPSULE | Freq: Two times a day (BID) | ORAL | 0 refills | Status: DC

## 2019-05-14 MED ORDER — ALBUTEROL SULFATE HFA 108 (90 BASE) MCG/ACT IN AERS: 2.0000 | INHALATION_SPRAY | RESPIRATORY_TRACT | 1 refills | Status: DC | PRN

## 2019-05-14 MED ORDER — ALBUTEROL SULFATE HFA 108 (90 BASE) MCG/ACT IN AERS
6.0000 | INHALATION_SPRAY | Freq: Once | RESPIRATORY_TRACT | Status: AC
  Administered 2019-05-14: 10:00:00 6 via RESPIRATORY_TRACT
  Filled 2019-05-14: qty 6.7

## 2019-05-14 MED ORDER — ALBUTEROL SULFATE HFA 108 (90 BASE) MCG/ACT IN AERS
6.0000 | INHALATION_SPRAY | Freq: Once | RESPIRATORY_TRACT | Status: AC
  Administered 2019-05-14: 11:00:00 6 via RESPIRATORY_TRACT
  Filled 2019-05-14: qty 6.7

## 2019-05-14 MED ORDER — IPRATROPIUM BROMIDE HFA 17 MCG/ACT IN AERS
2.0000 | INHALATION_SPRAY | Freq: Once | RESPIRATORY_TRACT | Status: AC
  Administered 2019-05-14: 11:00:00 2 via RESPIRATORY_TRACT
  Filled 2019-05-14: qty 12.9

## 2019-05-14 MED ORDER — IPRATROPIUM BROMIDE HFA 17 MCG/ACT IN AERS: 2.0000 | INHALATION_SPRAY | Freq: Four times a day (QID) | RESPIRATORY_TRACT | 1 refills | Status: DC

## 2019-05-14 MED ORDER — ALBUTEROL SULFATE HFA 108 (90 BASE) MCG/ACT IN AERS
6.0000 | INHALATION_SPRAY | Freq: Once | RESPIRATORY_TRACT | Status: AC
  Administered 2019-05-14: 6 via RESPIRATORY_TRACT

## 2019-05-14 MED ORDER — SODIUM CHLORIDE 0.9 % IV SOLN
500.0000 mg | Freq: Once | INTRAVENOUS | Status: AC
  Administered 2019-05-14: 500 mg via INTRAVENOUS
  Filled 2019-05-14: qty 500

## 2019-05-14 MED ORDER — METHYLPREDNISOLONE SODIUM SUCC 125 MG IJ SOLR
125.0000 mg | Freq: Once | INTRAMUSCULAR | Status: AC
  Administered 2019-05-14: 09:00:00 125 mg via INTRAVENOUS
  Filled 2019-05-14: qty 2

## 2019-05-14 MED ORDER — IPRATROPIUM BROMIDE HFA 17 MCG/ACT IN AERS
2.0000 | INHALATION_SPRAY | Freq: Once | RESPIRATORY_TRACT | Status: AC
  Administered 2019-05-14: 15:00:00 2 via RESPIRATORY_TRACT
  Filled 2019-05-14: qty 12.9

## 2019-05-14 MED ORDER — PREDNISONE 20 MG PO TABS: 60.0000 mg | ORAL_TABLET | Freq: Every day | ORAL | 0 refills | Status: DC

## 2019-05-14 MED ORDER — SODIUM CHLORIDE 0.9 % IV SOLN
1.0000 g | Freq: Once | INTRAVENOUS | Status: AC
  Administered 2019-05-14: 1 g via INTRAVENOUS
  Filled 2019-05-14: qty 10

## 2019-05-14 NOTE — ED Notes (Signed)
Gave the patient a cup of sprite.

## 2019-05-14 NOTE — ED Triage Notes (Signed)
Pt arrives to ED with SOB since last week. Pt was seen last week for SOB and was told he has COPD. Pt states he wasn't tested for COVID. Pt reports dry cough. Denies fever or chills.

## 2019-05-14 NOTE — Discharge Instructions (Addendum)
It was our pleasure to provide your ER care today - we hope that you feel better.  Take prednisone and doxycycline as prescribed.   Use albuterol and atrovent inhalers as prescribed.   Avoid any smoking.  Follow up with primary care doctor in the coming week. Also follow up with pulmonary specialist in the next couple weeks. Call office Monday to arrange appointment.   Return to ER if worse, new symptoms, increased trouble breathing, chest pain, weak/fainting, other concern.

## 2019-05-14 NOTE — ED Provider Notes (Signed)
[facility] Provider Note   CSN: [id number] Arrival date & time: 05/14/19  [id number]    History   Chief Complaint Chief Complaint  Patient presents with  . Shortness of Breath    HPI Tristan Le is a 78 y.o. male.     Patient w hx copd, c/o increased non prod cough, and increased wheezing/sob in the past week. Symptoms gradual onset, mod-severe, constant, worsening. Was recently seen in ED w same - states while on zithromax and prednisone symptoms mildly improved, but now feels worse again. No chest pain or discomfort. No sore throat or runny nose. No covid+ known exposure. No fever or chills. No swelling. States compliant w home meds. +smoker.   The history is provided by the patient.  Shortness of Breath  Associated symptoms: cough   Associated symptoms: no abdominal pain, no chest pain, no fever, no headaches, no neck pain, no rash, no sore throat and no vomiting     Past Medical History:  Diagnosis Date  . Arthritis   . Chest pain   . GERD (gastroesophageal reflux disease)   . Hyperlipidemia   . Hypertension     Patient Active Problem List   Diagnosis Date Noted  . Chest pain   . Arthritis     History reviewed. No pertinent surgical history.      Home Medications    Prior to Admission medications   Medication Sig Start Date End Date Taking? Authorizing Provider  albuterol (VENTOLIN HFA) 108 (90 Base) MCG/ACT inhaler Inhale 1-2 puffs into the lungs daily as needed for shortness of breath. 08/31/18   [provider]  azithromycin (ZITHROMAX) 250 MG tablet Take 250-500 mg by mouth See admin instructions. Take 500 mg on the first day and 250 mg for the next 4 05/03/19   [provider]  enalapril (VASOTEC) 10 MG tablet Take 10 mg by mouth daily.    [provider]  hydrochlorothiazide (MICROZIDE) 12.5 MG capsule Take 12.5 mg by mouth daily.    [provider]  omega-3 acid ethyl esters  (LOVAZA) 1 g capsule Take 2 capsules by mouth daily. 04/05/19   [provider]  pantoprazole (PROTONIX) 40 MG tablet Take 1 tablet (40 mg total) by mouth daily. Patient taking differently: Take 40 mg by mouth daily as needed.  03/31/18   Napoleon FormNandigam, Kavitha V, MD  ranitidine (ZANTAC) 300 MG tablet Take 0.5 tablets (150 mg total) by mouth at bedtime. Patient not taking: Reported on 05/06/2019 03/31/18   Napoleon FormNandigam, Kavitha V, MD  sucralfate (CARAFATE) 1 GM/10ML suspension Take 10 mLs (1 g total) by mouth 4 (four) times daily as needed. Patient not taking: Reported on 05/06/2019 03/31/18   Napoleon FormNandigam, Kavitha V, MD    Family History Family History  Problem Relation Age of Onset  . Heart disease Father   . Heart disease Brother   . Hyperlipidemia Brother     Social History Social History   Tobacco Use  . Smoking status: Current Every Day Smoker    Packs/day: 0.50    Years: 60.00    Pack years: 30.00    Types: Cigarettes  . Smokeless tobacco: Never Used  Substance Use Topics  . Alcohol use: No  . Drug use: No     Allergies   Penicillins and Sulfonamide derivatives   Review of Systems Review of Systems  Constitutional: Negative for fever.  HENT: Negative for sore throat.   Eyes: Negative for redness.  Respiratory:  Positive for cough and shortness of breath.   Cardiovascular: Negative for chest pain, palpitations and leg swelling.  Gastrointestinal: Negative for abdominal pain, diarrhea and vomiting.  Genitourinary: Negative for flank pain.  Musculoskeletal: Negative for back pain and neck pain.  Skin: Negative for rash.  Neurological: Negative for headaches.  Hematological: Does not bruise/bleed easily.  Psychiatric/Behavioral: Negative for confusion.     Physical Exam Updated Vital Signs BP (!) 154/86 (BP Location: Left Arm)   Pulse (!) 110 Comment: Simultaneous filing. User may not have seen previous data.  Temp 97.7 F (36.5 C) (Oral)   Resp (!) 25 Comment:  Simultaneous filing. User may not have seen previous data.  SpO2 94% Comment: Simultaneous filing. User may not have seen previous data.  Physical Exam Vitals signs and nursing note reviewed.  Constitutional:      Appearance: Normal appearance. He is well-developed.  HENT:     Head: Atraumatic.     Nose: Nose normal.     Mouth/Throat:     Mouth: Mucous membranes are moist.     Pharynx: Oropharynx is clear.  Eyes:     General: No scleral icterus.    Conjunctiva/sclera: Conjunctivae normal.  Neck:     Musculoskeletal: Normal range of motion and neck supple. No neck rigidity.     Trachea: No tracheal deviation.  Cardiovascular:     Rate and Rhythm: Normal rate and regular rhythm.     Pulses: Normal pulses.     Heart sounds: Normal heart sounds. No murmur. No friction rub. No gallop.   Pulmonary:     Effort: Respiratory distress present. No accessory muscle usage.     Breath sounds: Wheezing present.  Abdominal:     General: Bowel sounds are normal. There is no distension.     Palpations: Abdomen is soft.     Tenderness: There is no abdominal tenderness. There is no guarding.  Genitourinary:    Comments: No cva tenderness. Musculoskeletal:        General: No swelling or tenderness.     Right lower leg: No edema.     Left lower leg: No edema.  Skin:    General: Skin is warm and dry.     Findings: No rash.  Neurological:     Mental Status: He is alert.     Comments: Alert, speech clear.   Psychiatric:        Mood and Affect: Mood normal.      ED Treatments / Results  Labs (all labs ordered are listed, but only abnormal results are displayed) Results for orders placed or performed during the hospital encounter of 05/14/19  SARS Coronavirus 2 (CEPHEID- Performed in [facility] hospital lab), [facility] Order  Result Value Ref Range   SARS Coronavirus 2 NEGATIVE NEGATIVE  CBC  Result Value Ref Range   WBC 14.0 (H) 4.0 - 10.5 K/uL   RBC 5.01 4.22 - 5.81 MIL/uL   Hemoglobin  17.2 (H) 13.0 - 17.0 g/dL   HCT 46.9 62.9 - 52.8 %   MCV 98.8 80.0 - 100.0 fL   MCH 34.3 (H) 26.0 - 34.0 pg   MCHC 34.7 30.0 - 36.0 g/dL   RDW 41.3 24.4 - 01.0 %   Platelets 221 150 - 400 K/uL   nRBC 0.0 0.0 - 0.2 %  Basic metabolic panel  Result Value Ref Range   Sodium 134 (L) 135 - 145 mmol/L   Potassium 4.2 3.5 - 5.1 mmol/L   Chloride 98 98 -  111 mmol/L   CO2 21 (L) 22 - 32 mmol/L   Glucose, Bld 159 (H) 70 - 99 mg/dL   BUN 23 8 - 23 mg/dL   Creatinine, Ser 7.20 0.61 - 1.24 mg/dL   Calcium 9.5 8.9 - 94.7 mg/dL   GFR calc non Af Amer 57 (L) >60 mL/min   GFR calc Af Amer >60 >60 mL/min   Anion gap 15 5 - 15   Dg Chest 2 View  Result Date: 05/06/2019 CLINICAL DATA:  Shortness of breath EXAM: CHEST - 2 VIEW COMPARISON:  08/08/2016 FINDINGS: Normal heart size and stable mediastinal contours. Calcified right hilar lymph nodes. There is no edema, consolidation, effusion, or pneumothorax. Mild coarsening of lung markings which is stable and likely scarring. IMPRESSION: Stable from prior.  No evidence of acute disease. Electronically Signed   By: Marnee Spring M.D.   On: 05/06/2019 06:27    EKG EKG Interpretation  Date/Time:  Saturday May 14 2019 08:39:58 EDT Ventricular Rate:  108 PR Interval:    QRS Duration: 87 QT Interval:  327 QTC Calculation: 439 R Axis:   -41 Text Interpretation:  Sinus tachycardia Left axis deviation No significant change since last tracing Confirmed by Cathren Laine ([id number]) on 05/14/2019 9:27:00 AM   Radiology Dg Chest Port 1 View  Result Date: 05/14/2019 CLINICAL DATA:  Cough and shortness of breath. EXAM: PORTABLE CHEST 1 VIEW COMPARISON:  May 06, 2019 FINDINGS: The heart size and mediastinal contours are within normal limits. Both lungs are clear. The visualized skeletal structures are unremarkable. IMPRESSION: No active disease. Electronically Signed   By: Gerome Sam III M.D   On: 05/14/2019 09:32    Procedures Procedures (including critical  care time)  Medications Ordered in ED Medications  albuterol (VENTOLIN HFA) 108 (90 Base) MCG/ACT inhaler 6 puff (has no administration in time range)  ipratropium (ATROVENT HFA) inhaler 2 puff (has no administration in time range)  methylPREDNISolone sodium succinate (SOLU-MEDROL) 125 mg/2 mL injection 125 mg (125 mg Intravenous Given 05/14/19 0920)     Initial Impression / Assessment and Plan / ED Course  I have reviewed the triage vital signs and the nursing notes.  Pertinent labs & imaging results that were available during my care of the patient were reviewed by me and considered in my medical decision making (see chart for details).  Iv ns. Continuous pulse ox and monitor. Stat ecg, labs and pcxr.   Reviewed nursing notes and prior charts for additional history.   Albuterol and atrovent mdi treatments given.  Solumedrol iv.   covid test sent.  Tristan Le was evaluated in Emergency Department on 05/14/2019 for the symptoms described in the history of present illness. He was evaluated in the context of the global COVID-19 pandemic, which necessitated consideration that the patient might be at risk for infection with the SARS-CoV-2 virus that causes COVID-19. Institutional protocols and algorithms that pertain to the evaluation of patients at risk for COVID-19 are in a state of rapid change based on information released by regulatory bodies including the CDC and federal and state organizations. These policies and algorithms were followed during the patient's care in the ED.  CXR reviewed by me - no pna.  Labs reviewed by me - wbc 14. k normal.   No definite pna on cxr, with increased cough, copd, hx bronchitis. abx given.   Recheck wheezing persists, but improved. Additional alb mdi treatment.   Recheck again, wheezing resolved. Pt reports feels breathing at  baseline and requests d/c to home.   sats 95% room air. No increased wob. Good air exchange.  Patient currently  appears stable for d/c.   rec close pcp f/u. Will refer to pulm f/u as well.  Return precautions provided.  Verified pts pharmacy, will give rx pred, alb, atrov, doxy.     Final Clinical Impressions(s) / ED Diagnoses   Final diagnoses:  None    ED Discharge Orders    None       Cathren Laine, MD 05/14/19 1439

## 2019-05-14 NOTE — ED Notes (Signed)
Patient verbalizes understanding of discharge instructions. Opportunity for questioning and answers were provided. Armband removed by staff, pt discharged from ED.  

## 2019-05-18 ENCOUNTER — Encounter: Payer: Self-pay | Admitting: Emergency Medicine

## 2019-05-18 ENCOUNTER — Other Ambulatory Visit: Payer: Self-pay

## 2019-05-18 ENCOUNTER — Ambulatory Visit: Payer: Medicare HMO | Admitting: Emergency Medicine

## 2019-05-18 DIAGNOSIS — J410 Simple chronic bronchitis: Secondary | ICD-10-CM | POA: Diagnosis not present

## 2019-05-18 DIAGNOSIS — R059 Cough, unspecified: Secondary | ICD-10-CM | POA: Insufficient documentation

## 2019-05-18 DIAGNOSIS — J449 Chronic obstructive pulmonary disease, unspecified: Secondary | ICD-10-CM

## 2019-05-18 DIAGNOSIS — R05 Cough: Secondary | ICD-10-CM

## 2019-05-18 HISTORY — DX: Chronic obstructive pulmonary disease, unspecified: J44.9

## 2019-05-18 HISTORY — DX: Cough, unspecified: R05.9

## 2019-05-18 MED ORDER — TIOTROPIUM BROMIDE-OLODATEROL 2.5-2.5 MCG/ACT IN AERS: 2.0000 | INHALATION_SPRAY | Freq: Every day | RESPIRATORY_TRACT | 0 refills | Status: DC

## 2019-05-18 MED ORDER — IRBESARTAN 75 MG PO TABS: 75.0000 mg | ORAL_TABLET | Freq: Every day | ORAL | 1 refills | Status: DC

## 2019-05-18 NOTE — Progress Notes (Signed)
Patient seen in the office today and instructed on use of Stiolto Respimat.  Patient expressed understanding and demonstrated technique.  

## 2019-05-18 NOTE — Patient Instructions (Addendum)
Walking oximetry today on room air Please complete your prednisone as planned (last day today) Stop Atrovent inhaler We will start Stiolto 2 puffs once daily.  If you benefit from this medication then we will order it for you through your pharmacy. Keep your albuterol available to use 2 puffs if needed for shortness of breath, chest tightness, wheezing. Stop enalapril (Vasotec) for now. Start irbesartan 75 mg once daily. We will perform pulmonary function testing when our isolation procedures are loosened.  We can discuss the timing of this going forward. We will plan a follow-up virtual visit to discuss your response to the new inhaled medication and about 1 month. Follow with Dr Delton Coombes in 3 months or sooner if you have any problems.

## 2019-05-18 NOTE — Progress Notes (Signed)
Subjective:    Patient ID: Tristan Le, male    DOB: Sep 25, 1941, 78 y.o.   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  He is referred today for initial evaluation of suspected COPD.  He has been in the emergency room 3 times since mid April, most recently 05/14/2019.  He reports that he began to experience increased cough, increased mucous and sputum production > cloudy / green. Started to notice increased sx at the beginning of 2020 - he had a viral syndrome at that time and never fully recovered. Was seen in ED 4/20 w dyspnea, dizziness. Then again mid May for dyspnea and the sx above - has been treated with steroids, abx. He was given atrovent and albuterol, told to take qid. He feels that the albuterol helps his breathing, "opens him up". He has one more day of prednisone. He is still having some cough but much better, has used some mucinex and robitussin. Breathing is improved.   Chest x-ray done 05/06/2019 and 05/14/2019 both reviewed.  These show no infiltrates, some flattening of the bilateral hemidiaphragms consistent with some mild hyperinflation, very subtle peripheral basilar interstitial prominence.   Review of Systems  Constitutional: Negative for fever and unexpected weight change.  HENT: Negative for congestion, dental problem, ear pain, nosebleeds, postnasal drip, rhinorrhea, sinus pressure, sneezing, sore throat and trouble swallowing.   Eyes: Negative for redness and itching.  Respiratory: Positive for cough, shortness of breath and wheezing. Negative for chest tightness.   Cardiovascular: Positive for chest pain. Negative for palpitations and leg swelling.  Gastrointestinal: Negative for nausea and vomiting.  Genitourinary: Negative for dysuria.  Musculoskeletal: Negative for joint swelling.  Skin: Negative for rash.  Neurological:  Negative for headaches.  Hematological: Does not bruise/bleed easily.  Psychiatric/Behavioral: Negative for dysphoric mood. The patient is not nervous/anxious.    Past Medical History:  Diagnosis Date  . Arthritis   . Chest pain   . GERD (gastroesophageal reflux disease)   . Hyperlipidemia   . Hypertension      Family History  Problem Relation Age of Onset  . Heart disease Father   . Heart disease Brother   . Hyperlipidemia Brother      Social History   Socioeconomic History  . Marital status: Married    Spouse name: Not on file  . Number of children: 4  . Years of education: Not on file  . Highest education level: Not on file  Occupational History  . Occupation: Retired  Engineer, production  . Financial resource strain: Not on file  . Food insecurity:    Worry: Not on file    Inability: Not on file  . Transportation needs:    Medical: Not on file    Non-medical: Not on file  Tobacco Use  . Smoking status: Former Smoker    Packs/day: 0.50    Years: 60.00    Pack years: 30.00    Types: Cigarettes    Last attempt to quit: 04/27/2019    Years since quitting: 0.0  . Smokeless tobacco: Never Used  Substance and Sexual Activity  . Alcohol use: No  . Drug use: No  . Sexual activity: Not on file  Lifestyle  . Physical activity:    Days per week: Not on file    Minutes per session: Not on file  . Stress: Not on file  Relationships  . Social connections:    Talks on phone: Not on file    Gets together: Not on file    Attends religious service: Not on file    Active member of club or organization: Not on file    Attends meetings of clubs or organizations: Not on file    Relationship status: Not on file  . Intimate partner violence:    Fear of current or ex partner: Not on file    Emotionally abused: Not on file    Physically abused: Not on file    Forced sexual activity: Not on file  Other Topics Concern  . Not on file  Social History Narrative  . Not on file    has had many jobs - Psychologist, forensic, has been exposed to wood dust, occasionally wore a mask No military  Has lived in [place], [place], [place], [place]  Allergies  Allergen Reactions  . Penicillins     REACTION: Upset stomach,mouth ulcers,rash  . Sulfonamide Derivatives     REACTION: Rash with huge blisters     Outpatient Medications Prior to Visit  Medication Sig Dispense Refill  . albuterol (VENTOLIN HFA) 108 (90 Base) MCG/ACT inhaler Inhale 1-2 puffs into the lungs daily as needed for shortness of breath.    Marland Kitchen azithromycin (ZITHROMAX) 250 MG tablet Take 250-500 mg by mouth See admin instructions. Take 500 mg on the first day and 250 mg for the next 4    . doxycycline (VIBRAMYCIN) 100 MG capsule Take 1 capsule (100 mg total) by mouth 2 (two) times daily. 14 capsule 0  . enalapril (VASOTEC) 10 MG tablet Take 10 mg by mouth daily.    . hydrochlorothiazide (MICROZIDE) 12.5 MG capsule Take 12.5 mg by mouth daily.    Marland Kitchen ipratropium (ATROVENT HFA) 17 MCG/ACT inhaler Inhale 2 puffs into the lungs 4 (four) times daily. 1 Inhaler 1  . omega-3 acid ethyl esters (LOVAZA) 1 g capsule Take 2 capsules by mouth daily.    . pantoprazole (PROTONIX) 40 MG tablet Take 1 tablet (40 mg total) by mouth daily. (Patient taking differently: Take 40 mg by mouth daily as needed. ) 30 tablet 3  . predniSONE (DELTASONE) 20 MG tablet Take 3 tablets (60 mg total) by mouth daily. 15 tablet 0  . albuterol (VENTOLIN HFA) 108 (90 Base) MCG/ACT inhaler Inhale 2 puffs into the lungs every 4 (four) hours as needed for wheezing or shortness of breath. 1 Inhaler 1  . ranitidine (ZANTAC) 300 MG tablet Take 0.5 tablets (150 mg total) by mouth at bedtime. (Patient not taking: Reported on 05/06/2019) 30 tablet 3  . sucralfate (CARAFATE) 1 GM/10ML suspension Take 10 mLs (1 g total) by mouth 4 (four) times daily as needed. (Patient not taking: Reported on 05/06/2019) 420 mL 2   Facility-Administered Medications Prior to Visit  Medication Dose Route  Frequency Provider Last Rate Last Dose  . 0.9 %  sodium chloride infusion  500 mL Intravenous Once Napoleon Form, MD            Objective:   Physical Exam Vitals:   05/18/19 0917  BP: 124/76  Pulse: 88  SpO2: 96%  Weight: 217 lb (98.4 kg)  Height:  (1.854 m)   Gen: Pleasant, well-nourished, in no distress,  normal affect  ENT: No lesions,  mouth clear, edentulous, oropharynx clear, no postnasal drip  Neck: No JVD, no stridor  Lungs: No use of accessory muscles, no crackles or wheezing on normal  respiration, very mild wheeze on forced expiration  Cardiovascular: RRR, heart sounds normal, no murmur or gallops, no peripheral edema  Musculoskeletal: No deformities, no cyanosis or clubbing  Neuro: alert, awake, non focal  Skin: Warm, no lesions or rash     Assessment & Plan:  COPD (chronic obstructive pulmonary disease) (HCC) Presumed COPD based on his tobacco history, recent clinical presentation.  I suspect that this was an exacerbation of COPD.  He is somewhat improved.  He needs to be on maintenance therapy -we will try him on Stiolto to see if he gets benefit.  Stop the Atrovent, continue albuterol as needed.  Continue the Stiolto if he has a clinical response.  Ultimately he will need pulmonary function testing when COVID-19 isolation efforts have been loosened.  Walking oximetry today to ensure no occult desaturation.  We will talk about vaccinations next time.  Congratulated him on his smoking cessation.  Cough Still has a lingering cough and some upper airway irritation.  His and new inhaled medication may be a contributor but I would like to change his ACE inhibitor to an alternative as a potential contributor as well.  Stop enalapril, start irbesartan  Levy Pupaobert Khamari Yousuf, MD, PhD 05/18/2019, 9:51 AM [facility] [phone] or if no answer [phone]

## 2019-05-18 NOTE — Assessment & Plan Note (Signed)
Presumed COPD based on his tobacco history, recent clinical presentation.  I suspect that this was an exacerbation of COPD.  He is somewhat improved.  He needs to be on maintenance therapy -we will try him on Stiolto to see if he gets benefit.  Stop the Atrovent, continue albuterol as needed.  Continue the Stiolto if he has a clinical response.  Ultimately he will need pulmonary function testing when COVID-19 isolation efforts have been loosened.  Walking oximetry today to ensure no occult desaturation.  We will talk about vaccinations next time.  Congratulated him on his smoking cessation.

## 2019-05-18 NOTE — Assessment & Plan Note (Signed)
Still has a lingering cough and some upper airway irritation.  His and new inhaled medication may be a contributor but I would like to change his ACE inhibitor to an alternative as a potential contributor as well.  Stop enalapril, start irbesartan

## 2019-05-30 ENCOUNTER — Encounter: Payer: Self-pay | Admitting: Primary Care

## 2019-05-30 ENCOUNTER — Telehealth: Payer: Self-pay | Admitting: Emergency Medicine

## 2019-05-30 ENCOUNTER — Other Ambulatory Visit: Payer: Self-pay

## 2019-05-30 ENCOUNTER — Ambulatory Visit (INDEPENDENT_AMBULATORY_CARE_PROVIDER_SITE_OTHER): Payer: Medicare HMO | Admitting: Primary Care

## 2019-05-30 DIAGNOSIS — J441 Chronic obstructive pulmonary disease with (acute) exacerbation: Secondary | ICD-10-CM | POA: Diagnosis not present

## 2019-05-30 MED ORDER — IPRATROPIUM-ALBUTEROL 0.5-2.5 (3) MG/3ML IN SOLN: 3.0000 mL | Freq: Four times a day (QID) | RESPIRATORY_TRACT | 1 refills | Status: DC | PRN

## 2019-05-30 MED ORDER — PREDNISONE 10 MG PO TABS: ORAL_TABLET | ORAL | 0 refills | Status: DC

## 2019-05-30 NOTE — Patient Instructions (Addendum)
Continue Stiolto with holding chamber  New medications: Start duoneb q 6 hours as needed for shortness of breath/wheezing Prednisone taper (40mg  daily x 3 days; 30mg  x 3 days; 20mg  x 3 days; 10mg  x 3 days)  Orders: Sputum culture x3  DME- new nebulizer machine and supplies  Follow-up: Next televisit June 23rd with Dr. Delton Coombes  Needs to scheduled PFTs  ED is breathing symptoms worsen or O2 sustaining < 88-90%

## 2019-05-30 NOTE — Progress Notes (Signed)
Virtual Visit via Telephone Note  I connected with Tristan Le on 05/30/19 at  3:00 PM EDT by telephone and verified that I am speaking with the correct person using two identifiers.  Location: Patient: Home Provider: Office    I discussed the limitations, risks, security and privacy concerns of performing an evaluation and management service by telephone and the availability of in person appointments. I also discussed with the patient that there may be a patient responsible charge related to this service. The patient expressed understanding and agreed to proceed.   History of Present Illness: 78 year old male, former smoker (quit April 2020). PMH significant for COPD. Patient of Dr. Delton Coombes, seen for initial consult on 5/20. Started on [employer], needs pulmonary function testing.    05/30/2019 Patient called today for acute visit with complaints of cough with green mucus, wheezing and sob. Reports that he has had these symptoms for 5-6 weeks. He has been having a hard time using his new Stiolto inhaler, he was given valve holding chamber by his pcp today. States that he is getting up a little more mucus recently. He has been treated with two rounds of antibiotics in May including azithromycin and doxycycline. CXR on 05/14/19 showed clear lungs. Negative for covid.  Observations/Objective:  - O2 93-97% RA   Assessment and Plan:  COPD - Continue Stiolto 2 puffs daily - Add duoneb q6 hours prn sob/wheezing  - RX prednisone taper (40mg  x 3 days; 30mg  x 3 days; 20mg  x 3 days; 10mg  x 3 days) - Check sputum culture - Referral for nebulizer machine   - Needs PFTs  Follow Up Instructions:  - FU with Dr. Delton Coombes on June 23rd televisit or ED if symptoms worsen   I discussed the assessment and treatment plan with the patient. The patient was provided an opportunity to ask questions and all were answered. The patient agreed with the plan and demonstrated an understanding of the instructions.   The  patient was advised to call back or seek an in-person evaluation if the symptoms worsen or if the condition fails to improve as anticipated.  I provided 25 minutes of non-face-to-face time during this encounter.   [name], NP

## 2019-05-30 NOTE — Telephone Encounter (Signed)
Set up for video visit with Archie Patten or Beth, whoever has more openings.

## 2019-05-30 NOTE — Telephone Encounter (Signed)
Called and spoke with pt's daughter Tristan Le stating to her that we needed to get pt scheduled for visit to further evaluate symptoms. Tristan Le expressed understanding. Pt has been scheduled for televisit today 6/1 at 3pm with Tristan Le. Nothing further needed.

## 2019-05-30 NOTE — Telephone Encounter (Signed)
Primary Pulmonologist: RB Last office visit and with whom: 05/18/2019 w/ RB What do we see them for (pulmonary problems): Simple chronic bronchitis, cough  Reason for call: Spoke w/ pt's daughter. She states pt's breathing is worse than when she originally called and that he was "back in the bed and couldn't breathe." Pt daughter handed the phone to an off-duty firefighter/EMT friend who was present. Off-duty firefighter stated pt has expiratory wheezing in all fields, SOB, productive cough w/ green mucus. BP 112/52, HR 96, SpO2 92%. I had him hand the phone back to pt daughter to ask additional questions. Pt daughter states pt has been taking Stiolto 2 times daily and has taken albuterol rescue inhaler 8-10 times today without resolve in symptoms. Pt daughter denies fever/chills/muscle sores pt. I let pt daughter know that we will get this message routed to our provider of the day, but put emphasis on the fact that if his symptoms worsen--increased SOB, spike in fever, and O2 sats <90%, they should seek emergency care. Pt daughter verbalized understanding.    In the last month, have you been in contact with someone who was confirmed or suspected to have Conoravirus / COVID-19?  No  Do you have any of the following symptoms developed in the last 30 days? Fever: No Cough: Yes, non-productive w/ green mucus Shortness of breath: Yes  When did your symptoms start?  05/30/2019  If the patient has a fever, what is the last reading?  (use n/a if patient denies fever)  N/A . IF THE PATIENT STATES THEY DO NOT OWN A THERMOMETER, THEY MUST GO AND PURCHASE ONE When did the fever start?: N/A Have you taken any medication to suppress a fever (ie Ibuprofen, Aleve, Tylenol)?: N/A  Since Dr. Delton Coombes is not in-office, routing to APP of the day.  SG, please advise on your recommendations for this patient. Thank you.

## 2019-06-01 ENCOUNTER — Telehealth: Payer: Self-pay

## 2019-06-01 NOTE — Telephone Encounter (Signed)
Returned call and made patients daughter aware nebulizer will come in mail from [employer]. She inquired about results of sputum cx that was dropped off yesterday. Informed it could take 7-10 days to result. Nothing further needed.

## 2019-06-11 ENCOUNTER — Other Ambulatory Visit: Payer: Self-pay | Admitting: Pulmonary Disease

## 2019-06-11 ENCOUNTER — Telehealth: Payer: Self-pay | Admitting: Pulmonary Disease

## 2019-06-11 MED ORDER — PREDNISONE 20 MG PO TABS: 20.0000 mg | ORAL_TABLET | Freq: Two times a day (BID) | ORAL | 0 refills | Status: DC

## 2019-06-11 NOTE — Telephone Encounter (Signed)
Patient called answering service  Not feeling better, was better while still on prednisone He continues to use his nebulizer regularly Questions about recent cultures-could not find this on the computer  I did refill prednisone 20 p.o. twice daily for 5 days-sent into [facility] I did encourage him to call the office Monday or Tuesday to be seen in the office if is not doing better

## 2019-06-13 ENCOUNTER — Other Ambulatory Visit: Payer: Self-pay

## 2019-06-13 ENCOUNTER — Other Ambulatory Visit: Payer: Medicare HMO

## 2019-06-13 ENCOUNTER — Telehealth: Payer: Self-pay | Admitting: Emergency Medicine

## 2019-06-13 DIAGNOSIS — R059 Cough, unspecified: Secondary | ICD-10-CM

## 2019-06-13 DIAGNOSIS — R05 Cough: Secondary | ICD-10-CM

## 2019-06-13 DIAGNOSIS — J441 Chronic obstructive pulmonary disease with (acute) exacerbation: Secondary | ICD-10-CM

## 2019-06-13 MED ORDER — PREDNISONE 10 MG PO TABS: 10.0000 mg | ORAL_TABLET | Freq: Every day | ORAL | 0 refills | Status: DC

## 2019-06-13 NOTE — Telephone Encounter (Signed)
Returned call to patient and daughter, Katharine Look.  Informed them of recommendations per B. Volanda Napoleon, NP.  Patient was agreeable to complete current prednisone prescription which was called in by Dr. Ander Slade and then continue Prednisone 10mg  daily until follow up with televisit with Dr. Lamonte Sakai on June 23.  Patient will need additional prednisone prescription sent to [facility] on [address].  Order will be placed and sent to B. Volanda Napoleon, NP for signature.    Informed patient that we followed up on sputum culture with our lab employee, Irma.  Appears the sputum sample was not resulted and no collection info entered on order to show sample was collected.  Irma states this could be due to no labeling on specimen and it thrown out by lab or insufficient sample.  Patient states no one called from lab to state insufficient sample.  Unable to determine why sample was not resulted.  Apologized to patient and daughter.  Daughter states she brought sample herself to our office and delivered to staff in [place] on 05/30/19 and she states she believes sample must have been lost.  Informed her that our process is to take sample to lab, lab checks in sample and the courier picks up sample to take to testing facility.  Again apologized for the miscommunication.    Daughter will pick up two additional sample cups today.  Cups and instructions placed in foyer for pick up with labels for containers.    Will route orders to B. Volanda Napoleon, NP for approval on prednisone.  Nothing further needed.  Beth patient has prednisone prescription from Dr. Ander Slade but will need extension in order to continue Prednisone 10mg  daily until June 23.  Please review order for signature. Thank you

## 2019-06-13 NOTE — Addendum Note (Signed)
Addended by: Suzzanne Cloud E on: 06/13/2019 03:10 PM   Modules accepted: Orders

## 2019-06-13 NOTE — Telephone Encounter (Signed)
He has a televisit with Dr. Lamonte Sakai on June 23rd. Stay on prednisone 10mg  daily until follow-up.

## 2019-06-13 NOTE — Telephone Encounter (Signed)
Returned to call to patient and daughter, Tristan Le who was with the patient.  Patient gets better on prednisone then with 2-3 days begins feeling worse again.  Dr. Ander Slade was contacted 06/11/19 (on call) and provided another course of Prednisone that patient is currently but patient was advised to call for office f/up today if not significantly improved.     Primary Pulmonologist: [place]  Last office visit and with whom: 05/30/2019 Tristan Le What do we see them for (pulmonary problems): COPD  Reason for call: SOB, cough (green phlegm), wheezing returning after pred taper (currently on another round of pred)  In the last month, have you been in contact with someone who was confirmed or suspected to have Conoravirus / COVID-19?  Denies (had neg covid test 05/14/19)  Do you have any of the following symptoms developed in the last 30 days? Fever: Tristan Le Cough: yes - increasing from usual cough Shortness of breath: yes - increasing from usual cough  When did your symptoms start? Recurring sx for 2-3 months  If the patient has a fever, what is the last reading?  (use n/a if patient denies fever)  NA  . IF THE PATIENT STATES THEY DO NOT OWN A THERMOMETER, THEY MUST GO AND PURCHASE ONE When did the fever start?: NA  Have you taken any medication to suppress a fever (ie Ibuprofen, Aleve, Tylenol)?:  no  05/30/2019 Tristan Le COPD - Continue Stiolto 2 puffs daily - Add duoneb q6 hours prn sob/wheezing  - RX prednisone taper (40mg  x 3 days; 30mg  x 3 days; 20mg  x 3 days; 10mg  x 3 days) - Check sputum culture - Referral for nebulizer machine   - Needs PFTs  Follow Up Instructions:  - FU with Dr. Lamonte Sakai on June 23rd televisit or ED if symptoms worsen  I discussed the assessment and treatment plan with the patient. The patient was provided an opportunity to ask questions and all were answered. The patient agreed with the plan and demonstrated an understanding of the instructions.  The patient was  advised to call back or seek an in-person evaluation if the symptoms worsen or if the condition fails to improve as anticipated.  Patient is using stiolto, duonebs and on Pred 20mg  BID x 5days called in by Dr. Ander Slade on 06/11/19.   **Had recent sputum culture and have not been notified of results.  (brought sample to our office 05/30/19 per daughter)    Routed to B. Tristan Napoleon, NP (app of the day) for review.    Beth please advise if you want this to be a virtual visit or patient needs to come into office.  Dr. Ander Slade recommended follow up in office from on call notation over the weekend 06/11/19.

## 2019-06-13 NOTE — Telephone Encounter (Signed)
Order completed by B. Volanda Napoleon, NP for prednisone.  Nothing further needed.

## 2019-06-15 ENCOUNTER — Institutional Professional Consult (permissible substitution): Payer: Medicare HMO | Admitting: Internal Medicine

## 2019-06-17 ENCOUNTER — Telehealth: Payer: Self-pay | Admitting: Emergency Medicine

## 2019-06-17 NOTE — Telephone Encounter (Signed)
Returned call to patient and notified him that once lab results are in completed and reviewed by a provider we will contact him.  Patient acknowledged understanding.  Nothing further needed.

## 2019-06-21 ENCOUNTER — Telehealth (INDEPENDENT_AMBULATORY_CARE_PROVIDER_SITE_OTHER): Payer: Medicare HMO | Admitting: Emergency Medicine

## 2019-06-21 ENCOUNTER — Encounter: Payer: Self-pay | Admitting: Emergency Medicine

## 2019-06-21 DIAGNOSIS — J410 Simple chronic bronchitis: Secondary | ICD-10-CM

## 2019-06-21 DIAGNOSIS — R05 Cough: Secondary | ICD-10-CM | POA: Diagnosis not present

## 2019-06-21 DIAGNOSIS — R059 Cough, unspecified: Secondary | ICD-10-CM

## 2019-06-21 DIAGNOSIS — J441 Chronic obstructive pulmonary disease with (acute) exacerbation: Secondary | ICD-10-CM

## 2019-06-21 MED ORDER — BUDESONIDE 0.5 MG/2ML IN SUSP: 0.5000 mg | Freq: Two times a day (BID) | RESPIRATORY_TRACT | 5 refills | Status: DC

## 2019-06-21 NOTE — Assessment & Plan Note (Signed)
COPD the driving factor but his allergic rhinitis is contributing.  Question whether he may have bronchiectasis as well.  May decide to CT his chest at some point going forward to assess.  I asked him to start loratadine 10 mg daily, added flutter valve.

## 2019-06-21 NOTE — Progress Notes (Signed)
Virtual Visit via Video Note  I connected with DAI MCADAMS on 06/21/19 at  9:00 AM EDT by a video enabled telemedicine application and verified that I am speaking with the correct person using two identifiers.  Location: Patient: Home Provider: Office    I discussed the limitations of evaluation and management by telemedicine and the availability of in person appointments. The patient expressed understanding and agreed to proceed.  History of Present Illness: 78 year old man with presumed COPD based on clinical history of several acute exacerbations and tobacco history.  I met him in May 2020 and we started him on Stiolto.  Also changed his ACE inhibitor to  Irbesartan.  He was evaluated on 6/1 by phone visit, described increased cough, increased mucus production, difficulty delivering his Stiolto.  Treated with a prednisone taper.  Sputum culture was obtained on 6/15 that showed normal flora, no epi, no fungus.   Observations/Objective: Pt feeling some better. Has benefited from nebs, gets better delivery. He is finishing his prednisone taper, on 77m. Uses mucinex bid. Difficulty clearing secretions - stringy, solid. Having allergy sx - congestion, drainage.    Assessment and Plan: COPD (chronic obstructive pulmonary disease[place] Suspect significant COPD with chronic bronchitic symptoms.  His mucus burden increases and he has difficulty clearing secretions when he is not on prednisone.  I will add Pulmicort nebs.  Continue DuoNeb and changed to 4 times a day on a schedule.  Stop the Stiolto as he is having difficulty with delivery.  Continue Mucinex on a schedule.  Add a flutter valve.  Follow-up in 1 month to assess his status.  He will need pulmonary function testing to quantify his airflows after this exacerbation.  Cough COPD the driving factor but his allergic rhinitis is contributing.  Question whether he may have bronchiectasis as well.  May decide to CT his chest at some point  going forward to assess.  I asked him to start loratadine 10 mg daily, added flutter valve.   Follow Up Instructions: 1 month    I discussed the assessment and treatment plan with the patient. The patient was provided an opportunity to ask questions and all were answered. The patient agreed with the plan and demonstrated an understanding of the instructions.   The patient was advised to call back or seek an in-person evaluation if the symptoms worsen or if the condition fails to improve as anticipated.  I provided 27 minutes of non-face-to-face time during this encounter.   RBaltazar Apo MD, PhD 06/21/2019, 9:32 AM [facility] [phone] if no answer [phone]

## 2019-06-21 NOTE — Patient Instructions (Signed)
Stop Stiolto We will continue DuoNeb 4 times a day on a schedule. Add Pulmicort nebs every 12 hours on a schedule Keep your albuterol available to use 2 puffs if you need it for shortness of breath, chest tightness, wheezing. Finish your prednisone taper as planned Start using a flutter valve 2 times a day to help with mucus clearance Start loratadine 10 mg (Claritin) once daily until next visit We will arrange for pulmonary function testing Follow-up with Dr. Lamonte Sakai in 1 month to assess her status

## 2019-06-21 NOTE — Addendum Note (Signed)
Addended by: Hildred Alamin I on: 06/21/2019 09:40 AM   Modules accepted: Orders

## 2019-06-21 NOTE — Assessment & Plan Note (Signed)
Suspect significant COPD with chronic bronchitic symptoms.  His mucus burden increases and he has difficulty clearing secretions when he is not on prednisone.  I will add Pulmicort nebs.  Continue DuoNeb and changed to 4 times a day on a schedule.  Stop the Stiolto as he is having difficulty with delivery.  Continue Mucinex on a schedule.  Add a flutter valve.  Follow-up in 1 month to assess his status.  He will need pulmonary function testing to quantify his airflows after this exacerbation.

## 2019-06-22 ENCOUNTER — Telehealth: Payer: Self-pay | Admitting: Emergency Medicine

## 2019-06-22 MED ORDER — FLUTTER DEVI: 0 refills | Status: AC

## 2019-06-22 NOTE — Addendum Note (Signed)
Addended by: Vivia Ewing on: 06/22/2019 05:15 PM   Modules accepted: Orders

## 2019-06-22 NOTE — Telephone Encounter (Signed)
Called and spoke with Patient's Daughter, Tristan Le.  Tristan Le stated they understand how to use flutter.  Went over with Tristan Le how to use flutter device.  Understanding stated.  Offered instructions through the mail or pick up.  Tristan Le stated she would call if further questions or instructions.  Nothing further at this time.

## 2019-06-29 ENCOUNTER — Telehealth: Payer: Self-pay | Admitting: Emergency Medicine

## 2019-06-29 MED ORDER — PREDNISONE 10 MG PO TABS: ORAL_TABLET | ORAL | 0 refills | Status: DC

## 2019-06-29 NOTE — Telephone Encounter (Signed)
It is ok with me to use 20mg  pred x 5 days, then decrease to 10mg  daily thereafter until he can be seen. He needs OV with either RB or APP in next 2 weeks to discuss pred dosing, ppossible daliresp or other alternatives. Also - he hasn't had his PFT

## 2019-06-29 NOTE — Telephone Encounter (Signed)
Called and spoke with Patient's daughter, Katharine Look. Dr Lamonte Sakai recommendations given.  Understanding stated.  Patient scheduled with Brian,NP, 07/13/19, at 1000. Prescriptions sent to requested pharmacy. Nothing further at this time.

## 2019-06-29 NOTE — Telephone Encounter (Signed)
Instructions from visit 6/1 with Tristan Le Return in about 4 weeks (around 06/27/2019). Continue Stiolto with holding chamber  New medications: Start duoneb q 6 hours as needed for shortness of breath/wheezing Prednisone taper (40mg  daily x 3 days; 30mg  x 3 days; 20mg  x 3 days; 10mg  x 3 days)  Orders: Sputum culture x3  DME- new nebulizer machine and supplies  Follow-up: Next televisit June 23rd with Tristan Le  Needs to scheduled PFTs  ED is breathing symptoms worsen or O2 sustaining < 88-90%      Instructions from visit 6/23 with RB Stop Stiolto We will continue DuoNeb 4 times a day on a schedule. Add Pulmicort nebs every 12 hours on a schedule Keep your albuterol available to use 2 puffs if you need it for shortness of breath, chest tightness, wheezing. Finish your prednisone taper as planned Start using a flutter valve 2 times a day to help with mucus clearance Start loratadine 10 mg (Claritin) once daily until next visit We will arrange for pulmonary function testing Follow-up with Tristan Le in 1 month to assess her status     Called and spoke with pt's daughter Tristan Le who stated that pt is no longer taking the prednisone as he took his last pill 6/27. Per Tristan Le, pt has had a set back after not being on the prednisone.  Per Tristan Le and pt, pt is coughing more and having more congestion. Pt is coughing up green mucus. Per Tristan Le, pt's symptoms began again yesterday, 6/30.  Pt denies any complaints of any temp.  Pt is using the flutter valve as directed, using the duoneb and pulmicort nebs as directed, and is taking claritin as directed.  Tristan Le is wanting pt to go back on prednisone. Tristan Le, please advise on this for pt and daughter Tristan Le. Thanks!

## 2019-07-08 LAB — FUNGUS CULTURE W SMEAR
MICRO NUMBER:: 569786
SMEAR:: NONE SEEN
SPECIMEN QUALITY:: ADEQUATE

## 2019-07-08 LAB — RESPIRATORY CULTURE OR RESPIRATORY AND SPUTUM CULTURE
MICRO NUMBER:: 569787
RESULT:: NORMAL
SPECIMEN QUALITY:: ADEQUATE

## 2019-07-12 NOTE — Progress Notes (Signed)
 @Patient  ID: Tristan Le    DOB: [birth year], 78 y.o.   MRN: [id number]  Chief Complaint  Patient presents with  . Follow-up    COPD follow up     Referring provider: Alden HippEverhart, [name], [place]  HPI:  78 year old Le former smoker followed in our office for COPD  PMH: Arthritis, cough Smoker/ Smoking History: Former smoker Maintenance:  Pulmicort, Duonebs every 6 hours  Pt of: Byrum   07/13/2019  - Visit   78 year old Le former smoker presenting to our office today as a follow-up visit for management of his COPD.  We do not have formal pulmonary function testing on the patient.  At patient's last video visit in June/2020 his Stiolto Respimat inhaler was stopped.  Patient was placed on Pulmicort nebulized meds twice daily as well as scheduled duo nebs every 6 hours.  Patient reports that this medication regimen is helping him.  He does feel that the effects of the duo nebs does wear off and then he has shortness of breath for about 30 minutes to an hour prior to his next DuoNeb use.  The cost of the Pulmicort is slightly high patient is reporting it is around $54 he gets this from the [facility].  Patient's daughter who is present over the telephone for the office visit has concerns regarding the patient's recurrent exacerbations.  Patient's daughter is interested in potentially having the patient on a daily oral steroid to prevent recurrent exacerbations.    Tests:   06/13/2019-AFB-negative Fungal culture-showing yeast Respiratory sputum culture-negative  05/14/2019-SARS-CoV-2-negative  05/06/2019-CT chest-stable from prior, no evidence of acute disease   FENO:  No results found for: NITRICOXIDE  PFT: No flowsheet data found.  Imaging: Dg Chest 2 View  Result Date: 07/13/2019 CLINICAL DATA:  COPD EXAM: CHEST - 2 VIEW COMPARISON:  05/14/2019 FINDINGS: The heart size is normal. Calcified right hilar lymph nodes. Emphysema. Disc degenerative disease of the  thoracic spine. IMPRESSION: Emphysema.  No acute abnormality of the lungs. Electronically Signed   By: Lauralyn PrimesAlex  Bibbey M.D.   On: 07/13/2019 11:29      Specialty Problems      Pulmonary Problems   COPD (chronic obstructive pulmonary disease) (HCC)   Cough    06/13/2019-AFB-negative Fungal culture-showing yeast Respiratory sputum culture-negative          Allergies  Allergen Reactions  . Penicillins     REACTION: Upset stomach,mouth ulcers,rash  . Sulfonamide Derivatives     REACTION: Rash with huge blisters     There is no immunization history on file for this patient.  Past Medical History:  Diagnosis Date  . Arthritis   . Chest pain   . GERD (gastroesophageal reflux disease)   . Hyperlipidemia   . Hypertension     Tobacco History: Social History   Tobacco Use  Smoking Status Former Smoker  . Packs/day: 0.50  . Years: 60.00  . Pack years: 30.00  . Types: Cigarettes  . Quit date: 04/27/2019  . Years since quitting: 0.2  Smokeless Tobacco Never Used   Counseling given: Yes   Continue to not smoke  Outpatient Encounter Medications as of 07/13/2019  Medication Sig  . albuterol (VENTOLIN HFA) 108 (90 Base) MCG/ACT inhaler Inhale 1-2 puffs into the lungs daily as needed for shortness of breath.  . budesonide (PULMICORT) 0.5 MG/2ML nebulizer solution Take 2 mLs (0.5 mg total) by nebulization 2 (two) times a day for 30 days.  . hydrochlorothiazide (MICROZIDE) 12.5 MG capsule Take  12.5 mg by mouth daily.  Marland Kitchen. ipratropium-albuterol (DUONEB) 0.5-2.5 (3) MG/3ML SOLN Take 3 mLs by nebulization every 6 (six) hours as needed (shortness of breath/wheezing).  . irbesartan (AVAPRO) 75 MG tablet Take 1 tablet (75 mg total) by mouth daily.  Marland Kitchen. omega-3 acid ethyl esters (LOVAZA) 1 g capsule Take 2 capsules by mouth daily.  . pantoprazole (PROTONIX) 40 MG tablet Take 1 tablet (40 mg total) by mouth daily. (Patient taking differently: Take 40 mg by mouth daily as needed. )  .  predniSONE (DELTASONE) 10 MG tablet Take 1 tablet (10 mg total) by mouth daily with breakfast. Until your visit with Dr. Delton CoombesByrum 06/21/19.  Marland Kitchen. Respiratory Therapy Supplies (FLUTTER) DEVI Use as directed  . [DISCONTINUED] albuterol (VENTOLIN HFA) 108 (90 Base) MCG/ACT inhaler Inhale 1-2 puffs into the lungs daily as needed for shortness of breath.  . Tiotropium Bromide-Olodaterol (STIOLTO RESPIMAT) 2.5-2.5 MCG/ACT AERS Inhale 2 puffs into the lungs daily. (Patient not taking: Reported on 07/13/2019)  . [DISCONTINUED] predniSONE (DELTASONE) 10 MG tablet Take 20mg  daily  x's 5 days then 10mg  daily until OV  . [DISCONTINUED] Revefenacin (YUPELRI) 175 MCG/3ML SOLN Inhale 1 vial into the lungs daily.  . [DISCONTINUED] Revefenacin (YUPELRI) 175 MCG/3ML SOLN Inhale 1 vial into the lungs daily.  . [DISCONTINUED] Revefenacin (YUPELRI) 175 MCG/3ML SOLN Inhale 1 vial into the lungs daily.   Facility-Administered Encounter Medications as of 07/13/2019  Medication  . 0.9 %  sodium chloride infusion     Review of Systems  Review of Systems  Constitutional: Positive for fatigue. Negative for activity change, chills, fever and unexpected weight change.  HENT: Negative for rhinorrhea, sinus pressure, sinus pain and sore throat.   Eyes: Negative.   Respiratory: Positive for shortness of breath and wheezing. Negative for cough.   Cardiovascular: Negative for chest pain and palpitations.  Gastrointestinal: Negative for diarrhea, nausea and vomiting.  Endocrine: Negative.   Genitourinary: Negative.   Musculoskeletal: Negative.   Skin: Negative.   Neurological: Negative for dizziness and headaches.  Psychiatric/Behavioral: Negative.  Negative for dysphoric mood. The patient is not nervous/anxious.   All other systems reviewed and are negative.    Physical Exam  BP 114/70 (BP Location: Left Arm, Cuff Size: Normal)   Pulse 97   Temp 98 F (36.7 C) (Oral)   Ht 6\' 1"  (1.854 m)   Wt 222 lb 12.8 oz (101.1 kg)    SpO2 96%   BMI 29.39 kg/m   Wt Readings from Last 5 Encounters:  07/13/19 222 lb 12.8 oz (101.1 kg)  05/18/19 217 lb (98.4 kg)  05/06/19 220 lb (99.8 kg)  04/13/19 220 lb (99.8 kg)  04/02/18 220 lb (99.8 kg)     Physical Exam Vitals signs and nursing note reviewed.  Constitutional:      General: He is not in acute distress.    Appearance: Normal appearance.     Comments: Frail elderly adult Le  HENT:     Head: Normocephalic and atraumatic.     Right Ear: Hearing, tympanic membrane, ear canal and external ear normal.     Left Ear: Hearing, tympanic membrane, ear canal and external ear normal.     Nose: Nose normal. No mucosal edema or rhinorrhea.     Right Turbinates: Not enlarged.     Left Turbinates: Not enlarged.     Mouth/Throat:     Mouth: Mucous membranes are dry.     Pharynx: Oropharynx is clear. No oropharyngeal exudate.  Eyes:  Pupils: Pupils are equal, round, and reactive to light.  Neck:     Musculoskeletal: Normal range of motion.  Cardiovascular:     Rate and Rhythm: Normal rate and regular rhythm.     Pulses: Normal pulses.     Heart sounds: Normal heart sounds. No murmur.  Pulmonary:     Effort: Pulmonary effort is normal.     Breath sounds: Decreased breath sounds (throughout exam) and wheezing (end exp wheeze) present. No rales.  Musculoskeletal:     Right lower leg: No edema.     Left lower leg: No edema.  Lymphadenopathy:     Cervical: No cervical adenopathy.  Skin:    General: Skin is warm and dry.     Capillary Refill: Capillary refill takes less than 2 seconds.     Findings: No erythema or rash.  Neurological:     General: No focal deficit present.     Mental Status: He is alert and oriented to person, place, and time.     Motor: No weakness.     Coordination: Coordination normal.     Gait: Gait is intact. Gait normal.  Psychiatric:        Mood and Affect: Mood normal.        Behavior: Behavior normal. Behavior is cooperative.         Thought Content: Thought content normal.        Cognition and Memory: Memory is impaired (Occasional struggles with medication management).        Judgment: Judgment normal.      Lab Results:  CBC    Component Value Date/Time   WBC 14.0 (H) 05/14/2019 0920   RBC 5.01 05/14/2019 0920   HGB 17.2 (H) 05/14/2019 0920   HCT 49.5 05/14/2019 0920   PLT 221 05/14/2019 0920   MCV 98.8 05/14/2019 0920   MCH 34.3 (H) 05/14/2019 0920   MCHC 34.7 05/14/2019 0920   RDW 11.9 05/14/2019 0920   LYMPHSABS 3.3 05/06/2019 0600   MONOABS 0.9 05/06/2019 0600   EOSABS 0.7 (H) 05/06/2019 0600   BASOSABS 0.1 05/06/2019 0600    BMET    Component Value Date/Time   NA 134 (L) 05/14/2019 0920   K 4.2 05/14/2019 0920   CL 98 05/14/2019 0920   CO2 21 (L) 05/14/2019 0920   GLUCOSE 159 (H) 05/14/2019 0920   BUN 23 05/14/2019 0920   CREATININE 1.22 05/14/2019 0920   CALCIUM 9.5 05/14/2019 0920   GFRNONAA 57 (L) 05/14/2019 0920   GFRAA >60 05/14/2019 0920    BNP No results found for: BNP  ProBNP No results found for: PROBNP    Assessment & Plan:   COPD (chronic obstructive pulmonary disease) (HCC) Plan: Continue Pulmicort nebs twice daily Trial of Yupelri nebulized daily Can use duo nebs every 6-8 hours as needed for shortness of breath or wheezing Patient needs to complete pulmonary function testing We will send nebulizers through [facility] pharmacy to see if this is more affordable for the patient Follow-up in 6 weeks with a pulmonary function test  Medication management Plan: We will continue on ICS nebulized med, trial of lama nebulized med We [facility] as I do not believe patient's nebulizers are being ran through correct Medicare insurance May need to consider referral to pharmacist in office if medication cost do not improve  May also need to consider daily corticosteroid use if were unable to find an affordable balance with maintenance  nebulized meds.  Healthcare  maintenance Plan: Patient needs pneumonia vaccines based off of her records at next office visit Patient will need flu vaccine in the fall  Cough Plan: Continue flutter valve Continue Mucinex May need to consider CT chest in the future to assess bronchiectasis Continue daily antihistamine    Return in about 6 weeks (around 08/24/2019), or if symptoms worsen or fail to improve, for Follow up for PFT, Follow up with Wyn Quaker FNP-C, Follow up with Dr. Lamonte Sakai.   Lauraine Rinne, NP 07/13/2019   This appointment was 34 minutes long with over 50% of the time in direct face-to-face patient care, assessment, plan of care, and follow-up.

## 2019-07-13 ENCOUNTER — Ambulatory Visit (INDEPENDENT_AMBULATORY_CARE_PROVIDER_SITE_OTHER): Payer: Medicare HMO | Admitting: Pulmonary Disease

## 2019-07-13 ENCOUNTER — Other Ambulatory Visit: Payer: Self-pay

## 2019-07-13 ENCOUNTER — Encounter: Payer: Self-pay | Admitting: Pulmonary Disease

## 2019-07-13 ENCOUNTER — Telehealth: Payer: Self-pay | Admitting: Pulmonary Disease

## 2019-07-13 ENCOUNTER — Ambulatory Visit (INDEPENDENT_AMBULATORY_CARE_PROVIDER_SITE_OTHER): Payer: Medicare HMO

## 2019-07-13 VITALS — BP 114/70 | HR 97 | Temp 98.0°F | Ht 73.0 in | Wt 222.8 lb

## 2019-07-13 DIAGNOSIS — Z0000 Encounter for general adult medical examination without abnormal findings: Secondary | ICD-10-CM | POA: Diagnosis not present

## 2019-07-13 DIAGNOSIS — J410 Simple chronic bronchitis: Secondary | ICD-10-CM

## 2019-07-13 DIAGNOSIS — Z79899 Other long term (current) drug therapy: Secondary | ICD-10-CM

## 2019-07-13 DIAGNOSIS — R059 Cough, unspecified: Secondary | ICD-10-CM

## 2019-07-13 DIAGNOSIS — J441 Chronic obstructive pulmonary disease with (acute) exacerbation: Secondary | ICD-10-CM

## 2019-07-13 DIAGNOSIS — R05 Cough: Secondary | ICD-10-CM | POA: Diagnosis not present

## 2019-07-13 MED ORDER — YUPELRI 175 MCG/3ML IN SOLN: 1.0000 | Freq: Every day | RESPIRATORY_TRACT | 0 refills | Status: DC

## 2019-07-13 MED ORDER — YUPELRI 175 MCG/3ML IN SOLN: 1.0000 | Freq: Every day | RESPIRATORY_TRACT | 3 refills | Status: DC

## 2019-07-13 MED ORDER — ALBUTEROL SULFATE HFA 108 (90 BASE) MCG/ACT IN AERS: 1.0000 | INHALATION_SPRAY | Freq: Every day | RESPIRATORY_TRACT | 3 refills | Status: DC | PRN

## 2019-07-13 NOTE — Assessment & Plan Note (Signed)
Plan: Patient needs pneumonia vaccines based off of her records at next office visit Patient will need flu vaccine in the fall

## 2019-07-13 NOTE — Assessment & Plan Note (Signed)
Plan: Continue flutter valve Continue Mucinex May need to consider CT chest in the future to assess bronchiectasis Continue daily antihistamine

## 2019-07-13 NOTE — Progress Notes (Signed)
Chest x-ray stable.  showing emphysema.  No acute changes.  No changes with plan of care.  Wyn Quaker, FNP

## 2019-07-13 NOTE — Assessment & Plan Note (Addendum)
Plan: Continue Pulmicort nebs twice daily Trial of Yupelri nebulized daily Can use duo nebs every 6-8 hours as needed for shortness of breath or wheezing Patient needs to complete pulmonary function testing We will send nebulizers through [facility] to see if this is more affordable for the patient Follow-up in 6 weeks with a pulmonary function test

## 2019-07-13 NOTE — Telephone Encounter (Signed)
07/13/2019 2208  Danae Chen, Can we please send a prescription for Pulmicort nebs as well as Yupelri nebulized medication to [facility].  This should allow for them to assess the overall cost of these nebulized medications.  I have canceled the prescription sent to [facility] as well as [facility].  Thank you  Wyn Quaker FNP

## 2019-07-13 NOTE — Assessment & Plan Note (Addendum)
Plan: We will continue on ICS nebulized med, trial of lama nebulized med We will send medications through [facility] as I do not believe patient's nebulizers are being ran through correct Medicare insurance May need to consider referral to pharmacist in office if medication cost do not improve  May also need to consider daily corticosteroid use if were unable to find an affordable balance with maintenance nebulized meds.

## 2019-07-13 NOTE — Patient Instructions (Addendum)
Pulmicort Neb >>> 1 nebulized treatment (ampule) every 12 hours >>>Rinse mouth out after use  Trial of Yupelri Neb >>> 1 nebulized treatment daily >>> Rinse mouth out after use  Can use DuoNeb nebulized medication every 6 hours as needed for shortness of breath and wheezing   We need to get you scheduled to complete a breathing test  We may need to consider chronic steroid use if we cannot achieve affordability with the nebulized treatments  Note your daily symptoms > remember "red flags" for COPD:   >>>Increase in cough >>>increase in sputum production >>>increase in shortness of breath or activity  intolerance.   If you notice these symptoms, please call the office to be seen.    Return in about 6 weeks (around 08/24/2019), or if symptoms worsen or fail to improve, for Follow up for PFT, Follow up with Wyn Quaker FNP-C, Follow up with Dr. Lamonte Sakai.

## 2019-07-14 MED ORDER — YUPELRI 175 MCG/3ML IN SOLN: 1.0000 | Freq: Every day | RESPIRATORY_TRACT | 11 refills | Status: DC

## 2019-07-14 MED ORDER — BUDESONIDE 0.5 MG/2ML IN SUSP: 0.5000 mg | Freq: Two times a day (BID) | RESPIRATORY_TRACT | 11 refills | Status: DC

## 2019-07-14 NOTE — Telephone Encounter (Signed)
Pulmicort and Maretta Bees has been sent to [facility] per Tyler Aas.

## 2019-07-18 ENCOUNTER — Telehealth: Payer: Self-pay | Admitting: Acute Care

## 2019-07-18 DIAGNOSIS — J441 Chronic obstructive pulmonary disease with (acute) exacerbation: Secondary | ICD-10-CM

## 2019-07-18 MED ORDER — PREDNISONE 10 MG PO TABS: 10.0000 mg | ORAL_TABLET | Freq: Every day | ORAL | 0 refills | Status: DC

## 2019-07-18 NOTE — Telephone Encounter (Signed)
Received an after hours call through the call center. Pt. Was seen by Aaron Edelman for COPD one  week ago. The patient was on a low dose of prednisone , and has been for the last 3 months. Aaron Edelman started the patient on  Pulmicort nebs twice daily, a  Trial of Yupelri nebulized daily, and  duo nebs every 6-8 hours as needed for shortness of breath or wheezing. He had stopped the prednisone at this time.  The daughter has called today as he is more short of breath on this regimen, and his sats have dropped from the 96% range to the 90 range. He is wheezing. She would like him restarted on the prednisone.  I will send in prednisone 10 mg tablets. 20 mg x 2 days, then drop to 10 mg a day, then follow up televisit/ OV to determine future regimen this week with Wyn Quaker NP or Dr. Lamonte Sakai.  Pt. Daughter Tristan Le verbalized understanding .  I told her we will need to try to wean the prednisone to a lower dose, we will do a follow up OV to determine this new regimen.   Triage, please schedule for televisit or OV with Aaron Edelman this week to establish regiment moving forward.

## 2019-07-18 NOTE — Telephone Encounter (Signed)
Noted. Thank you.   Brian

## 2019-07-19 NOTE — Telephone Encounter (Signed)
Thank you. Noted.   Tristan Le  

## 2019-07-19 NOTE — Telephone Encounter (Signed)
Spoke with patient's daughter Katharine Look. She stated that she had checked on her father this morning but he did not answer the phone. She stated that he is probably still sleeping as he does not get up early unless he has to.   Was able to get patient scheduled for a televisit with Aaron Edelman on Thursday at 130p. Katharine Look has asked that for the televisit, we call her number. Number has been added to the appt note.   Nothing further needed at time of call.

## 2019-07-21 ENCOUNTER — Ambulatory Visit (INDEPENDENT_AMBULATORY_CARE_PROVIDER_SITE_OTHER): Payer: Medicare HMO | Admitting: Pulmonary Disease

## 2019-07-21 ENCOUNTER — Telehealth: Payer: Self-pay | Admitting: Pharmacist

## 2019-07-21 ENCOUNTER — Encounter: Payer: Self-pay | Admitting: Pulmonary Disease

## 2019-07-21 ENCOUNTER — Other Ambulatory Visit: Payer: Self-pay

## 2019-07-21 DIAGNOSIS — Z79899 Other long term (current) drug therapy: Secondary | ICD-10-CM | POA: Diagnosis not present

## 2019-07-21 DIAGNOSIS — J410 Simple chronic bronchitis: Secondary | ICD-10-CM

## 2019-07-21 DIAGNOSIS — Z0000 Encounter for general adult medical examination without abnormal findings: Secondary | ICD-10-CM

## 2019-07-21 MED ORDER — PREDNISONE 10 MG PO TABS: 10.0000 mg | ORAL_TABLET | Freq: Every day | ORAL | 0 refills | Status: DC

## 2019-07-21 NOTE — Telephone Encounter (Signed)
Findings of benefits investigation via test claims for Pulmicort 0.82ml/2ml Respules :  Insurance: [employer] Medicare D  Ran test claim for 153ml for 30 days (60 respules). Patient's copay was $21.22 for 1 month supply  3:37 PM Beatriz Chancellor, CPhT

## 2019-07-21 NOTE — Telephone Encounter (Signed)
**   Copay below was through [facility]**

## 2019-07-21 NOTE — Progress Notes (Signed)
Virtual Visit via Telephone Note  I connected with Tristan Le on 07/21/19 at  1:30 PM EDT by telephone and verified that I am speaking with the correct person using two identifiers.  Location: Patient: Home Provider: [facility] - [address], [place], [place], [place] [address]   I discussed the limitations, risks, security and privacy concerns of performing an evaluation and management service by telephone and the availability of in person appointments. I also discussed with the patient that there may be a patient responsible charge related to this service. The patient expressed understanding and agreed to proceed.  Patient consented to consult via telephone: Yes People present and their role in pt care: Pt    History of Present Illness:  78 year old male former smoker followed in our office for COPD  PMH: Arthritis, cough Smoker/ Smoking History: Former smoker. Quit 04/27/2019. 30 pack years.  Maintenance:  Pulmicort, Duonebs every 6 hours  Pt of: By[place]   Chief complaint: COPD med follow up   78 year old male former smoker followed in our office and for suspected COPD.  Patient is waiting to complete pulmonary function testing which is currently scheduled for August/18/2020 with a follow-up with Dr. Lamonte Sakai.  Patient is maintained currently on Pulmicort nebulized meds.  He reports these cost $53 a month from [facility].  Were wondering if this medication may be cheaper from [facility].  Patient was also trialed on Yupelri nebulized meds.  Unfortunately this out-of-pocket cost is too much for a monthly prescription.  Out-of-pocket estimates would be about $236 a month.  Patient cannot afford this.  Patient will finish out the Yupelri nebulized samples, then transition to DuoNeb nebulized medications every 6 hours until next office visit with Dr. Lamonte Sakai August/2020.  Patient contacted our office with concerns of worsened breathing and was given prednisone.   Patient would like to be maintained on prednisone 10 mg daily until next office visit and breathing test is performed.  Patient feels that his breathing is better.  He does admit that he is getting weaker and he feels like over the last 6 months he has declined.  Patient has not been physically active.  Patient's daughter who is trained and works with [employer] has been working with the patient on increasing physical activity.  Patient was walking last week but has been unable to increase physical activity over the past couple of days.    Observations/Objective:  06/13/2019-AFB-negative Fungal culture-showing yeast Respiratory sputum culture-negative  05/14/2019-SARS-CoV-2-negative  05/06/2019-CT chest-stable from prior, no evidence of acute disease  Assessment and Plan:  Medication management Plan: Continue Pulmicort nebs When you finish Yupelri nebulized medications, transition back to duo nebs every 6 hours scheduled I will have our pharmacy team look up better ways to make the cost of Pulmicort nebs more affordable Continue prednisone 10 mg daily at this time  Can consider trial again of Stiolto Respimat with spacer to help with adherence or may be trial of Anoro Ellipta.  Can be evaluated in August/2020 office visit with Dr. Lamonte Sakai  Healthcare maintenance Plan: Patient needs pneumonia vaccine at next office visit Referral to home health and physical therapy   COPD (chronic obstructive pulmonary disease) ([place]) Plan: Continue Pulmicort nebs twice daily Finish trial of Yupelri nebulized medications, when samples were now transition back to duo nebs every 6 hours scheduled Continue prednisone 10 mg daily Complete pulmonary function testing August/2020 Referral to home health   Consider Stiolto Respimat were Anoro Ellipta trial  at next office visit as patient reports that when he was using his spacer with his Stiolto Respimat he felt he got adequate use of the medication.   This should be evaluated in office.  May benefit from pharmacist co-appointment.   Follow Up Instructions:  Return in about 6 weeks (around 09/01/2019), or if symptoms worsen or fail to improve, for Follow up with Dr. Delton CoombesByrum.   I discussed the assessment and treatment plan with the patient. The patient was provided an opportunity to ask questions and all were answered. The patient agreed with the plan and demonstrated an understanding of the instructions.   The patient was advised to call back or seek an in-person evaluation if the symptoms worsen or if the condition fails to improve as anticipated.  I provided 32 minutes of non-face-to-face time during this encounter.   Coral CeoBrian P Tyjae Shvartsman, NP

## 2019-07-21 NOTE — Telephone Encounter (Signed)
Please start benefits investigation for Pulmicort Respules.  His co-pay was $53 a month at [facility]. Provider recommends checking [facility] as they tend to be more affordable.  He has a follow up appointment in August.

## 2019-07-21 NOTE — Patient Instructions (Addendum)
Pulmicort Neb >>> 1 nebulized treatment (ampule) every 12 hours >>>Rinse mouth out after use  Trial of Yupelri Neb >>> 1 nebulized treatment daily >>> Rinse mouth out after use  When you run out of Yupelri nebulized medications, resume using duo nebs every 6 hours  Can use DuoNeb nebulized medication every 6 hours as needed for shortness of breath and wheezing  Continue prednisone 10 mg daily >>> Take in the morning >>> Take with food   Note your daily symptoms >remember "red flags" for COPD:  >>>Increase in cough >>>increase in sputum production >>>increase in shortness of breath or activity  intolerance.   If you notice these symptoms, please call the office to be seen.    Referral sent for home physical therapy and home nursing help with management of COPD and increasing physical activity  Return in about 6 weeks (around 09/01/2019), or if symptoms worsen or fail to improve.   Coronavirus (COVID-19) Are you at risk?  Are you at risk for the Coronavirus (COVID-19)?  To be considered HIGH RISK for Coronavirus (COVID-19), you have to meet the following criteria:  . Traveled to [place], [place], [place], [place] or [place]; or in the [place] to [place], [place], [place], or [place]; and have fever, cough, and shortness of breath within the last 2 weeks of travel OR . Been in close contact with a person diagnosed with COVID-19 within the last 2 weeks and have fever, cough, and shortness of breath . IF YOU DO NOT MEET THESE CRITERIA, YOU ARE CONSIDERED LOW RISK FOR COVID-19.  What to do if you are HIGH RISK for COVID-19?  Marland Kitchen If you are having a medical emergency, call 911. . Seek medical care right away. Before you go to a doctor's office, urgent care or emergency department, call ahead and tell them about your recent travel, contact with someone diagnosed with COVID-19, and your symptoms. You should receive instructions from your physician's office regarding  next steps of care.  . When you arrive at healthcare provider, tell the healthcare staff immediately you have returned from visiting [place], [place], [place], [place] or [place]; or traveled in the [place] to [place], [place], [place], or [place]; in the last two weeks or you have been in close contact with a person diagnosed with COVID-19 in the last 2 weeks.   . Tell the health care staff about your symptoms: fever, cough and shortness of breath. . After you have been seen by a medical provider, you will be either: o Tested for (COVID-19) and discharged home on quarantine except to seek medical care if symptoms worsen, and asked to  - Stay home and avoid contact with others until you get your results (4-5 days)  - Avoid travel on public transportation if possible (such as bus, train, or airplane) or o Sent to the Emergency Department by EMS for evaluation, COVID-19 testing, and possible admission depending on your condition and test results.  What to do if you are LOW RISK for COVID-19?  Reduce your risk of any infection by using the same precautions used for avoiding the common cold or flu:  Marland Kitchen Wash your hands often with soap and warm water for at least 20 seconds.  If soap and water are not readily available, use an alcohol-based hand sanitizer with at least 60% alcohol.  . If coughing or sneezing, cover your mouth and nose by coughing or sneezing into the elbow areas of your shirt or  coat, into a tissue or into your sleeve (not your hands). . Avoid shaking hands with others and consider head nods or verbal greetings only. . Avoid touching your eyes, nose, or mouth with unwashed hands.  . Avoid close contact with people who are sick. . Avoid places or events with large numbers of people in one location, like concerts or sporting events. . Carefully consider travel plans you have or are making. . If you are planning any travel outside or inside the [place], visit the CDC's Travelers'  Health webpage for the latest health notices. . If you have some symptoms but not all symptoms, continue to monitor at home and seek medical attention if your symptoms worsen. . If you are having a medical emergency, call 911.   ADDITIONAL HEALTHCARE OPTIONS FOR PATIENTS  [facility] / e-Visit: [email or web address]         [facility]: [phone]  [facility]: [phone]                   [facility]: [phone]           It is flu season:   >>> Best ways to protect herself from the flu: Receive the yearly flu vaccine, practice good hand hygiene washing with soap and also using hand sanitizer when available, eat a nutritious meals, get adequate rest, hydrate appropriately   Please contact the office if your symptoms worsen or you have concerns that you are not improving.   Thank you for choosing [facility] for your healthcare, and for allowing us to partner with you on your healthcare journey. I am thankful to be able to provide care to you today.   Elisha HeadlandBrian Haillie Radu FNP-C

## 2019-07-21 NOTE — Telephone Encounter (Signed)
[employer] or [employer] you please contact the patient as well as patient's daughter to let them know that [place] cost is cheaper than [place] and that this is been mailed out to the patient for Pulmicort nebs.  Patient has upcoming office visit with Dr. Lamonte Sakai on 08/16/2019.  This would be a great time for patient also have a in office pharmacy consultation as patient believes that he was successful with using Stiolto with a spacer.  Could consider trial of Stiolto Respimat with Pulmicort nebs again or trial of Anoro Ellipta with Pulmicort nebs.  Wyn Quaker FNP

## 2019-07-21 NOTE — Assessment & Plan Note (Signed)
Plan: Patient needs pneumonia vaccine at next office visit Referral to home health and physical therapy

## 2019-07-21 NOTE — Assessment & Plan Note (Signed)
Plan: Continue Pulmicort nebs When you finish Yupelri nebulized medications, transition back to duo nebs every 6 hours scheduled I will have our pharmacy team look up better ways to make the cost of Pulmicort nebs more affordable Continue prednisone 10 mg daily at this time  Can consider trial again of Stiolto Respimat with spacer to help with adherence or may be trial of Anoro Ellipta.  Can be evaluated in August/2020 office visit with Dr. Lamonte Sakai

## 2019-07-21 NOTE — Assessment & Plan Note (Addendum)
Plan: Continue Pulmicort nebs twice daily Finish trial of Yupelri nebulized medications, when samples were now transition back to duo nebs every 6 hours scheduled Continue prednisone 10 mg daily Complete pulmonary function testing August/2020 Referral to home health   Consider Stiolto Respimat were Anoro Ellipta trial at next office visit as patient reports that when he was using his spacer with his Stiolto Respimat he felt he got adequate use of the medication.  This should be evaluated in office.  May benefit from pharmacist co-appointment.

## 2019-07-21 NOTE — Telephone Encounter (Signed)
[facility] to check on pricing for Pulmicort 0.5/67ml rx sent in on 07/14/19.  Phone# [phone]  Rep- Varney Biles  Rep Varney Biles advised that prescription was received and shipped out to patient on 07/14/19. But she states, the pharmacy has not yet billed the patient's insurance at this time. The claim is with their billing department to process. She estimated that the copay would possibly range from $27-30, but could not guarantee.  4:02 PM Beatriz Chancellor, CPhT

## 2019-07-22 ENCOUNTER — Telehealth: Payer: Self-pay | Admitting: Pulmonary Disease

## 2019-07-22 NOTE — Telephone Encounter (Signed)
Called and spoke with Tristan Le letting her know the verbal for home health PT and nursing eval was fine. [name] verbalized understanding. Nothing further needed.

## 2019-07-22 NOTE — Telephone Encounter (Signed)
Called and spoke with [name]. Constance Haw is needing a verbal order for pt to do home health physical therapy and also for a nursing eval. Aaron Edelman, please advise if you are okay with [place] giving the verbal. Thanks!

## 2019-07-22 NOTE — Telephone Encounter (Signed)
Yes ok   Tristan Le

## 2019-07-25 NOTE — Telephone Encounter (Signed)
Left message for patient to advise of Pulmicort neb shipment coming from [facility]. Cost for patient is estimated between $27-30.  Left call back number [phone]  10:21 AM Beatriz Chancellor, CPhT

## 2019-07-25 NOTE — Telephone Encounter (Signed)
Patient returned my call. Advised him of the information below. He will keep an eye out for the shipment and reach out if he has any questions or concerns.  11:00 AM Beatriz Chancellor, CPhT

## 2019-07-26 ENCOUNTER — Telehealth: Payer: Self-pay | Admitting: Pulmonary Disease

## 2019-07-26 NOTE — Telephone Encounter (Signed)
Yes this is fine.  I am unsure why they need this follow-up when I placed the order for them to work with the patient.  Wyn Quaker, FNP

## 2019-07-26 NOTE — Telephone Encounter (Signed)
Called and spoke with Dorian Pod from [facility] stating to her that Aaron Edelman was fine providing the verbal for the services. Dorian Pod verbalized understanding. Nothing further needed.

## 2019-07-26 NOTE — Telephone Encounter (Signed)
Called and spoke with Dorian Pod from [facility]. Dorian Pod is needing a verbal order for pt to have nursing for 1 week and 4 weeks to monitor pt's breathing. Aaron Edelman, please advise if you are okay with [place] giving the verbal.

## 2019-07-28 LAB — AFB CULTURE WITH SMEAR (NOT AT ARMC)
Acid Fast Culture: NEGATIVE
Acid Fast Smear: NEGATIVE

## 2019-08-02 ENCOUNTER — Telehealth: Payer: Self-pay | Admitting: Pulmonary Disease

## 2019-08-02 NOTE — Telephone Encounter (Signed)
Called patient's daughter Katharine Look let her know the update and they are just going to see Dr. Lamonte Sakai on the 08/16/19. Because the yupelri is so expensive I told the daughter to check with the insurance and obtain the drug formulary and bring it to the OV on the 18th so Dr. Lamonte Sakai can look to see what is covered and have a better choice for patient if possible.  Nothing further needed at this time.

## 2019-08-02 NOTE — Telephone Encounter (Signed)
If he is on DuoNeb on a schedule, then he should not also be on Yupelri also. Would not restart this until he can be seen here

## 2019-08-02 NOTE — Telephone Encounter (Signed)
Called and spoke with [facility] from [facility]. She states the patients wife is requesting samples of yupelri. However, as of documentation on 07/21/19 patient hasn't been taking this medication due to being unable to afford medication.   Safari then informed me the patient's wife says patient wasn't coughing as much when he was on the yupelri. AND that they are now taking the duoneb every 6 hours. Not as needed.   Patient has an appointment in office on 08/16/19 with Dr. Lamonte Sakai.   Dr. Lamonte Sakai please advise if patient can receive samples to get him to his appointment on the 18th or change to something different.

## 2019-08-04 ENCOUNTER — Other Ambulatory Visit: Payer: Self-pay | Admitting: Emergency Medicine

## 2019-08-04 ENCOUNTER — Telehealth: Payer: Self-pay | Admitting: Emergency Medicine

## 2019-08-04 DIAGNOSIS — J410 Simple chronic bronchitis: Secondary | ICD-10-CM

## 2019-08-04 MED ORDER — PREDNISONE 10 MG PO TABS: 10.0000 mg | ORAL_TABLET | Freq: Every day | ORAL | 1 refills | Status: DC

## 2019-08-04 NOTE — Telephone Encounter (Signed)
I have refilled the prednisone  Spoke with the pt's daughter and notified that this was done  Nothing further needed

## 2019-08-05 ENCOUNTER — Telehealth: Payer: Self-pay | Admitting: Emergency Medicine

## 2019-08-05 MED ORDER — YUPELRI 175 MCG/3ML IN SOLN: 1.0000 | Freq: Every day | RESPIRATORY_TRACT | 0 refills | Status: DC

## 2019-08-05 NOTE — Telephone Encounter (Signed)
Called and spoke with pt's wife Katharine Look letting her know that Aaron Edelman said if she does feel like pt needs to be evaluated by ER MD that she could take him there for evaluation. After I stated that to her, she said that at she does not think he has fully gotten to that point yet but said that he might be at that point by the weekend.  Katharine Look was wanting to know if they could probably have samples of the Yupelri and I stated to her that we could send Rx to [facility] for pt but she then said that the Yupelri was going to be around $200.  She said that she was told by Aaron Edelman to do the two week trial of the Yupelri with the samples that were provided and then after that for pt to go back to doing the duoneb.  Katharine Look said once pt went back to the duoneb is when she noticed a decline in pt. Katharine Look is wanting to know if pt can have more samples of the Yuperli as they do not have enough for pt to be able to do tx until PFT on 8/18. Katharine Look said if pt is unable to be provided more samples of the Yupelri that pt would need to have an Rx for duoneb sent to [facility].

## 2019-08-05 NOTE — Telephone Encounter (Signed)
Spoke with pt's daughter, Tristan Le. States that the pt is not feeling well. Reports increased shortness of breath, chest congestion and coughing. Cough is non productive at this time. He was on samples of Yupelri but got switched to Duoneb. Tristan Le feels like the pt started decline when this switch was made. Pt has a pending OV with RB on 08/16/2019 with a PFT as well. RB wanted to have the PFT done before anymore changes were made to his medications. Tristan Le would like for this message to be addressed by Tristan Le, she asked for him by name.  Tristan Le - please advise. Thanks!

## 2019-08-05 NOTE — Telephone Encounter (Signed)
Spoke with Katharine Look. She is aware of Brian's response. Pt is following all of Brian's recommendations >> Pulmicort BID, Duoneb q6h, Prednisone 10mg  daily. Sandra's only concern is if the pt keeps declining, she doesn't know what to do. She feels like is going to have to take him to the hospital.

## 2019-08-05 NOTE — Telephone Encounter (Signed)
Called and spoke with Katharine Look to clarify if pt's symptoms did become worse once transitioning off of Yupelri and she said that that was correct. asked Katharine Look if they were interested in more of a long-term management option whether it be Yuperli or something else and she said that that was what they were wanting. Stated to her that Aaron Edelman said we could provide pt two weeks worth of Yuperli samples to get him through until PFT and OV 8/18 and she verbalized understanding.  Samples have been placed up front for pt. Nothing further needed.

## 2019-08-05 NOTE — Telephone Encounter (Signed)
Katharine Look and the patient who were already instructed to return back to duo nebs.  Clearly there is some confusion.  This is the instructions from last video visit:  Pulmicort Neb >>>1 nebulized treatment (ampule) every 12 hours >>>Rinse mouth out after use  Trial of YupelriNeb >>>1 nebulized treatment daily >>>Rinse mouth out after use  When you run out of Yupelri nebulized medications, resume using duo nebs every 6 hours  Can use DuoNeb nebulized medication every 6 hours as needed for shortness of breath and wheezing  Continue prednisone 10 mg daily >>> Take in the morning >>> Take with food   Patient can transition back to duo nebs nebulized every 6 hours for shortness of breath and wheezing, if he wants to take this as scheduled every 6 hours that he can.  He needs to be maintained on 10 mg of prednisone.  Aaron Edelman

## 2019-08-05 NOTE — Telephone Encounter (Signed)
I am confused.  The last time when I spoke to the patient as well as Katharine Look they wanted the patient to go back to duo nebs scheduled.  That is what we discussed and that is what we planned on doing.  Sure we may be able to provide patient samples of Yupelri but if they cannot afford the long-term cost of Yupelri which was around $200 then this may not make much sense.  So to clarify:   Patient is daughters reporting that symptoms have worsened since being transitioned off of Yupelri? So they are interested in more of a long-term management option?    If that is the case probably then we can provide patient with 2 weeks of samples of Yupelri to make it to his pulmonary function test office visit where then he can discuss with Dr. Lamonte Sakai plans for long-term maintenance moving forward.  Wyn Quaker, FNP

## 2019-08-05 NOTE — Telephone Encounter (Signed)
Patient likely has severe to very severe COPD.  Which is why were trying to obtain pulmonary function testing.  If she has this concerns and feels that he needs to be evaluated by an emergency room provider then she can take him to the emergency room.We can also route this message to Dr. Lamonte Sakai for Dr. Lamonte Sakai to review when he returns to clinic next week if he has any additional thoughts or recommendations.  Wyn Quaker, FNP

## 2019-08-12 ENCOUNTER — Other Ambulatory Visit (HOSPITAL_COMMUNITY)
Admission: RE | Admit: 2019-08-12 | Discharge: 2019-08-12 | Disposition: A | Discharge status: Home / Self Care | Payer: Medicare HMO | Source: Ambulatory Visit | Attending: Emergency Medicine | Admitting: Emergency Medicine

## 2019-08-12 DIAGNOSIS — Z01812 Encounter for preprocedural laboratory examination: Secondary | ICD-10-CM | POA: Diagnosis present

## 2019-08-12 DIAGNOSIS — Z20828 Contact with and (suspected) exposure to other viral communicable diseases: Secondary | ICD-10-CM | POA: Insufficient documentation

## 2019-08-12 LAB — SARS CORONAVIRUS 2 (TAT 6-24 HRS): SARS Coronavirus 2: NEGATIVE

## 2019-08-16 ENCOUNTER — Ambulatory Visit (INDEPENDENT_AMBULATORY_CARE_PROVIDER_SITE_OTHER): Payer: Medicare HMO | Admitting: Emergency Medicine

## 2019-08-16 ENCOUNTER — Other Ambulatory Visit: Payer: Self-pay

## 2019-08-16 ENCOUNTER — Encounter: Payer: Self-pay | Admitting: Emergency Medicine

## 2019-08-16 ENCOUNTER — Ambulatory Visit: Payer: Medicare HMO | Admitting: Emergency Medicine

## 2019-08-16 VITALS — BP 132/72 | HR 77

## 2019-08-16 DIAGNOSIS — R0602 Shortness of breath: Secondary | ICD-10-CM

## 2019-08-16 DIAGNOSIS — J410 Simple chronic bronchitis: Secondary | ICD-10-CM | POA: Diagnosis not present

## 2019-08-16 DIAGNOSIS — J441 Chronic obstructive pulmonary disease with (acute) exacerbation: Secondary | ICD-10-CM

## 2019-08-16 LAB — PULMONARY FUNCTION TEST
DL/VA % pred: 88 %
DL/VA: 3.44 ml/min/mmHg/L
DLCO unc % pred: 77 %
DLCO unc: 21.13 ml/min/mmHg
FEF 25-75 Post: 2.86 L/sec
FEF 25-75 Pre: 2.54 L/sec
FEF2575-%Change-Post: 12 %
FEF2575-%Pred-Post: 116 %
FEF2575-%Pred-Pre: 103 %
FEV1-%Change-Post: 1 %
FEV1-%Pred-Post: 97 %
FEV1-%Pred-Pre: 96 %
FEV1-Post: 3.33 L
FEV1-Pre: 3.28 L
FEV1FVC-%Change-Post: 6 %
FEV1FVC-%Pred-Pre: 105 %
FEV6-%Change-Post: -3 %
FEV6-%Pred-Post: 92 %
FEV6-%Pred-Pre: 95 %
FEV6-Post: 4.08 L
FEV6-Pre: 4.23 L
FEV6FVC-%Change-Post: 1 %
FEV6FVC-%Pred-Post: 106 %
FEV6FVC-%Pred-Pre: 104 %
FVC-%Change-Post: -4 %
FVC-%Pred-Post: 86 %
FVC-%Pred-Pre: 91 %
FVC-Post: 4.09 L
FVC-Pre: 4.29 L
Post FEV1/FVC ratio: 81 %
Post FEV6/FVC ratio: 100 %
Pre FEV1/FVC ratio: 76 %
Pre FEV6/FVC Ratio: 99 %
RV % pred: 60 %
RV: 1.65 L
TLC % pred: 77 %
TLC: 5.91 L

## 2019-08-16 MED ORDER — YUPELRI 175 MCG/3ML IN SOLN: 175.0000 ug | Freq: Every day | RESPIRATORY_TRACT | 0 refills | Status: DC

## 2019-08-16 NOTE — Patient Instructions (Addendum)
We will continue Yupelri once daily Continue Pulmicort twice daily Keep either albuterol inhaler or DuoNeb available to use as needed for any additional shortness of breath, chest tightness, wheezing. We will decrease prednisone as follows:  -Change to 5 mg daily for the next 10 days. -Then decrease to 5 mg every other day for 7 days. -Then stop We will refer you to cardiology for an evaluation of any cardiac contribution to your shortness of breath and decreased functional capacity. Follow with Dr Lamonte Sakai in 3 months or sooner if you have any problems.

## 2019-08-16 NOTE — Assessment & Plan Note (Signed)
His clinical history and syndrome sound like COPD and he has responded to bronchodilator therapy.  All the same his pulmonary function testing today does not show significant obstruction, certainly his symptoms are out of proportion to his PFT.  He does have emphysematous change on imaging and sometimes we can see significant dyspnea that corresponds with parenchymal disease and emphysema without much in the way of obstruction.  All the same I think he needs a cardiology evaluation to further clarify his dyspnea.  I will plan to continue his Yupelri, wean him off prednisone.   We will continue Yupelri once daily Continue Pulmicort twice daily Keep either albuterol inhaler or DuoNeb available to use as needed for any additional shortness of breath, chest tightness, wheezing. We will decrease prednisone as follows:  -Change to 5 mg daily for the next 10 days. -Then decrease to 5 mg every other day for 7 days. -Then stop We will refer you to cardiology for an evaluation of any cardiac contribution to your shortness of breath and decreased functional capacity. Follow with Dr Lamonte Sakai in 3 months or sooner if you have any problems.

## 2019-08-16 NOTE — Progress Notes (Signed)
Subjective:    Patient ID: Tristan Le, male    DOB: [birth year], 78 y.o.   MRN: [id number]  HPI 78 year old former smoker (30 pack years, quit 3 weeks ago) with a history of hypertension, hyperlipidemia, degenerative disc disease with chronic back pain, GERD.  He is referred today for initial evaluation of suspected COPD.  He has been in the emergency room 3 times since mid April, most recently 05/14/2019.  He reports that he began to experience increased cough, increased mucous and sputum production > cloudy / green. Started to notice increased sx at the beginning of 2020 - he had a viral syndrome at that time and never fully recovered. Was seen in ED 4/20 w dyspnea, dizziness. Then again mid May for dyspnea and the sx above - has been treated with steroids, abx. He was given atrovent and albuterol, told to take qid. He feels that the albuterol helps his breathing, "opens him up". He has one more day of prednisone. He is still having some cough but much better, has used some mucinex and robitussin. Breathing is improved.   Chest x-ray done 05/06/2019 and 05/14/2019 both reviewed.  These show no infiltrates, some flattening of the bilateral hemidiaphragms consistent with some mild hyperinflation, very subtle peripheral basilar interstitial prominence.  ROV 08/16/2019 --this a follow-up visit for 78 year old gentleman with a history of tobacco use (30 pack years), hypertension, hyperlipidemia, chronic back pain, GERD.  I am seeing him for dyspnea and suspected COPD.  He had been experiencing shortness of breath, bronchitic symptoms, wheezing and had responded to corticosteroids, antibiotics, bronchodilators, all consistent with the syndrome.  We have attempted to manage him with initially Stiolto and then transitioned to [place].  He may have gotten some benefit from the latter but unfortunately it is been cost prohibitive.  He was on DuoNeb on a schedule, Pulmicort twice daily - [facility] days [facility].  He has been started again on prednisone 10 mg daily as of 08/04/2019.  He reports that it may be helping his breathing, also helping with his arthritic pain.  Chest x-ray from 07/13/2019 reviewed, shows significant emphysematous change. He has worked with PT, interestingly had good functional capacity.  1 of his issues has been hoarse throat and losing his voice, question related to the Pulmicort  He underwent pulmonary function testing today that I reviewed.  This shows mild obstruction without a bronchodilator response, superimposed restriction, decreased diffusion capacity that corrects to the normal range when adjusted for his alveolar volume.    Review of Systems  Constitutional: Negative for fever and unexpected weight change.  HENT: Negative for congestion, dental problem, ear pain, nosebleeds, postnasal drip, rhinorrhea, sinus pressure, sneezing, sore throat and trouble swallowing.   Eyes: Negative for redness and itching.  Respiratory: Positive for cough, shortness of breath and wheezing. Negative for chest tightness.   Cardiovascular: Positive for chest pain. Negative for palpitations and leg swelling.  Gastrointestinal: Negative for nausea and vomiting.  Genitourinary: Negative for dysuria.  Musculoskeletal: Negative for joint swelling.  Skin: Negative for rash.  Neurological: Negative for headaches.  Hematological: Does not bruise/bleed easily.  Psychiatric/Behavioral: Negative for dysphoric mood. The patient is not nervous/anxious.     Past Medical History:  Diagnosis Date   Arthritis    Chest pain    GERD (gastroesophageal reflux disease)    Hyperlipidemia    Hypertension      Family History  Problem Relation Age of Onset   Heart  disease Father    Heart disease Brother    Hyperlipidemia Brother      Social History   Socioeconomic History   Marital status: Married    Spouse name: Not on file   Number of children: 4   Years of  education: Not on file   Highest education level: Not on file  Occupational History   Occupation: Retired  Ecologistocial Needs   Financial resource strain: Not on file   Food insecurity    Worry: Not on file    Inability: Not on Occupational hygienistfile   Transportation needs    Medical: Not on file    Non-medical: Not on file  Tobacco Use   Smoking status: Former Smoker    Packs/day: 0.50    Years: 60.00    Pack years: 30.00    Types: Cigarettes    Quit date: 04/27/2019    Years since quitting: 0.3   Smokeless tobacco: Never Used  Substance and Sexual Activity   Alcohol use: No   Drug use: No   Sexual activity: Not on file  Lifestyle   Physical activity    Days per week: Not on file    Minutes per session: Not on file   Stress: Not on file  Relationships   Social connections    Talks on phone: Not on file    Gets together: Not on file    Attends religious service: Not on file    Active member of club or organization: Not on file    Attends meetings of clubs or organizations: Not on file    Relationship status: Not on file   Intimate partner violence    Fear of current or ex partner: Not on file    Emotionally abused: Not on file    Physically abused: Not on file    Forced sexual activity: Not on file  Other Topics Concern   Not on file  Social History Narrative   Not on file  has had many jobs - Psychologist, forensicbuilding furniture, has been exposed to wood dust, occasionally wore a mask No military  [place] lived in [place], [place], [place], [place]  Allergies  Allergen Reactions   Penicillins     REACTION: Upset stomach,mouth ulcers,rash   Sulfonamide Derivatives     REACTION: Rash with huge blisters     Outpatient Medications Prior to Visit  Medication Sig Dispense Refill   albuterol (VENTOLIN HFA) 108 (90 Base) MCG/ACT inhaler Inhale 1-2 puffs into the lungs daily as needed for shortness of breath. 18 g 3   diphenhydrAMINE (BENADRYL) 25 MG tablet Take 25 mg by mouth daily.     hydrochlorothiazide  (MICROZIDE) 12.5 MG capsule Take 12.5 mg by mouth daily.     ipratropium-albuterol (DUONEB) 0.5-2.5 (3) MG/3ML SOLN Take 3 mLs by nebulization every 6 (six) hours as needed (shortness of breath/wheezing). 360 mL 1   irbesartan (AVAPRO) 75 MG tablet Take 1 tablet (75 mg total) by mouth daily. 90 tablet 1   omega-3 acid ethyl esters (LOVAZA) 1 g capsule Take 2 capsules by mouth daily.     pantoprazole (PROTONIX) 40 MG tablet Take 1 tablet (40 mg total) by mouth daily. (Patient taking differently: Take 40 mg by mouth daily as needed. ) 30 tablet 3   predniSONE (DELTASONE) 10 MG tablet Take 1 tablet (10 mg total) by mouth daily with breakfast. 30 tablet 1   Respiratory Therapy Supplies (FLUTTER) DEVI Use as directed 1 each 0   Revefenacin (YUPELRI) 175 MCG/3ML  SOLN Inhale 1 vial into the lungs daily. 21 mL 0   Tiotropium Bromide-Olodaterol (STIOLTO RESPIMAT) 2.5-2.5 MCG/ACT AERS Inhale 2 puffs into the lungs daily. 2 Inhaler 0   budesonide (PULMICORT) 0.5 MG/2ML nebulizer solution Take 2 mLs (0.5 mg total) by nebulization 2 (two) times a day. 120 mL 11   Revefenacin (YUPELRI) 175 MCG/3ML SOLN Inhale 1 vial into the lungs daily. (Patient not taking: Reported on 07/21/2019) 90 mL 11   Facility-Administered Medications Prior to Visit  Medication Dose Route Frequency Provider Last Rate Last Dose   0.9 %  sodium chloride infusion  500 mL Intravenous Once Mauri Pole, MD            Objective:   Physical Exam Vitals:   08/16/19 0943  BP: 132/72  Pulse: 77  SpO2: 97%   Gen: Pleasant, well-nourished, in no distress,  normal affect  ENT: No lesions,  mouth clear, edentulous, oropharynx clear, no postnasal drip  Neck: No JVD, no stridor  Lungs: No use of accessory muscles, no crackles or wheezing on normal respiration, very mild wheeze on forced expiration  Cardiovascular: RRR, heart sounds normal, no murmur or gallops, no peripheral edema  Musculoskeletal: No deformities, no  cyanosis or clubbing  Neuro: alert, awake, non focal  Skin: Warm, no lesions or rash     Assessment & Plan:  COPD (chronic obstructive pulmonary disease) (HCC) His clinical history and syndrome sound like COPD and he has responded to bronchodilator therapy.  All the same his pulmonary function testing today does not show significant obstruction, certainly his symptoms are out of proportion to his PFT.  He does have emphysematous change on imaging and sometimes we can see significant dyspnea that corresponds with parenchymal disease and emphysema without much in the way of obstruction.  All the same I think he needs a cardiology evaluation to further clarify his dyspnea.  I will plan to continue his Yupelri, wean him off prednisone.   We will continue Yupelri once daily Continue Pulmicort twice daily Keep either albuterol inhaler or DuoNeb available to use as needed for any additional shortness of breath, chest tightness, wheezing. We will decrease prednisone as follows:  -Change to 5 mg daily for the next 10 days. -Then decrease to 5 mg every other day for 7 days. -Then stop We will refer you to cardiology for an evaluation of any cardiac contribution to your shortness of breath and decreased functional capacity. Follow with Dr Lamonte Sakai in 3 months or sooner if you have any problems.  Baltazar Apo, MD, PhD 08/16/2019, 4:43 PM [facility] [phone] or if no answer [phone]

## 2019-08-16 NOTE — Progress Notes (Signed)
PFT done today. 

## 2019-08-29 ENCOUNTER — Telehealth: Payer: Self-pay | Admitting: Emergency Medicine

## 2019-08-29 DIAGNOSIS — J410 Simple chronic bronchitis: Secondary | ICD-10-CM

## 2019-08-29 MED ORDER — PREDNISONE 10 MG PO TABS: 5.0000 mg | ORAL_TABLET | Freq: Every day | ORAL | 1 refills | Status: DC

## 2019-08-29 NOTE — Telephone Encounter (Signed)
Call returned to daughter Katharine Look, made aware of RB recommendations. She is okay with recommendations. She states he is due to come back in November so she would like a refill sent to pharmacy. Confirmed pharmacy. Refill sent. She also states she made a mistake over the phone, she reports her dad told her spiriva but she looked at the inhaler and it is stiolto. She states he reports he has not been using the stiolto because he prefers the nebulizers. Voiced understanding. Nothing further needed at this time.

## 2019-08-29 NOTE — Telephone Encounter (Signed)
1- he is supposed to be on yulpelri, but not the spiriva 2- agree with staying on prednisone 5mg  daily until we can follow up together RB

## 2019-08-29 NOTE — Telephone Encounter (Signed)
Call returned to daughter Katharine Look (dpr)m she reports he is starting to taper off of his prednisone and he is starting to have increased wheezing and SOB. She reports he is using the duoneb daily since decreasing the prednisone even with using the yupelri daily. She is requesting that her father either stay on the prednisone 5mg  or 2.5mg . She confirms that he is using his spiriva daily as well.   RB please advise. Thanks.

## 2019-09-13 ENCOUNTER — Telehealth: Payer: Self-pay | Admitting: Emergency Medicine

## 2019-09-13 MED ORDER — HYDROCHLOROTHIAZIDE 12.5 MG PO CAPS: 12.5000 mg | ORAL_CAPSULE | Freq: Every day | ORAL | 0 refills | Status: DC

## 2019-09-13 NOTE — Telephone Encounter (Signed)
Called and spoke with patient. Placed order per Dr. Lamonte Sakai. Instructed patient to call if still having symptoms. Patient voiced understanding. Nothing further needed at this time.

## 2019-09-13 NOTE — Telephone Encounter (Signed)
Called and spoke with Patient. Patient stated he has chest tightness, some occasional wheezing, and non productive cough that started 3-4 days ago. Patient denies fever, chills, or sob.  Patient stated [facility] pharmacy mail order hydrochlorothiazide has not been received at this time. Patient stated he was unsure if that was related to his symptoms, because when he ran out of medication, his symptoms started.   Message routed to Dr Lamonte Sakai

## 2019-09-13 NOTE — Telephone Encounter (Signed)
I agree that stopping his HCTZ could precipitate these symptoms. I'd like to send a script to fill until the mail-in arrives. If he continues to have symptoms after starting the HCTZ he needs to call us so we can pursue further.

## 2019-09-13 NOTE — Telephone Encounter (Signed)
Will call patient in AM as office is now closed and patient cannot pick up samples at this time.

## 2019-09-14 NOTE — Telephone Encounter (Signed)
ATC patient's wife back unable to reach , let her know we had samples just needed to know when she would be by to pick up so we could get the ready.

## 2019-09-15 MED ORDER — YUPELRI 175 MCG/3ML IN SOLN: 1.0000 | Freq: Every day | RESPIRATORY_TRACT | 0 refills | Status: DC

## 2019-09-15 NOTE — Telephone Encounter (Signed)
Spoke with pt's daughter, Katharine Look. Pt needs more samples of Yupelri. Samples have been placed at the front desk. Nothing further was needed.

## 2019-10-01 IMAGING — CR CHEST - 2 VIEW
2 series · 2 of 2 positions shown · non-contrast
Comparison: 08/08/2016

CLINICAL DATA: Shortness of breath

EXAM:
CHEST - 2 VIEW

[chest ap]
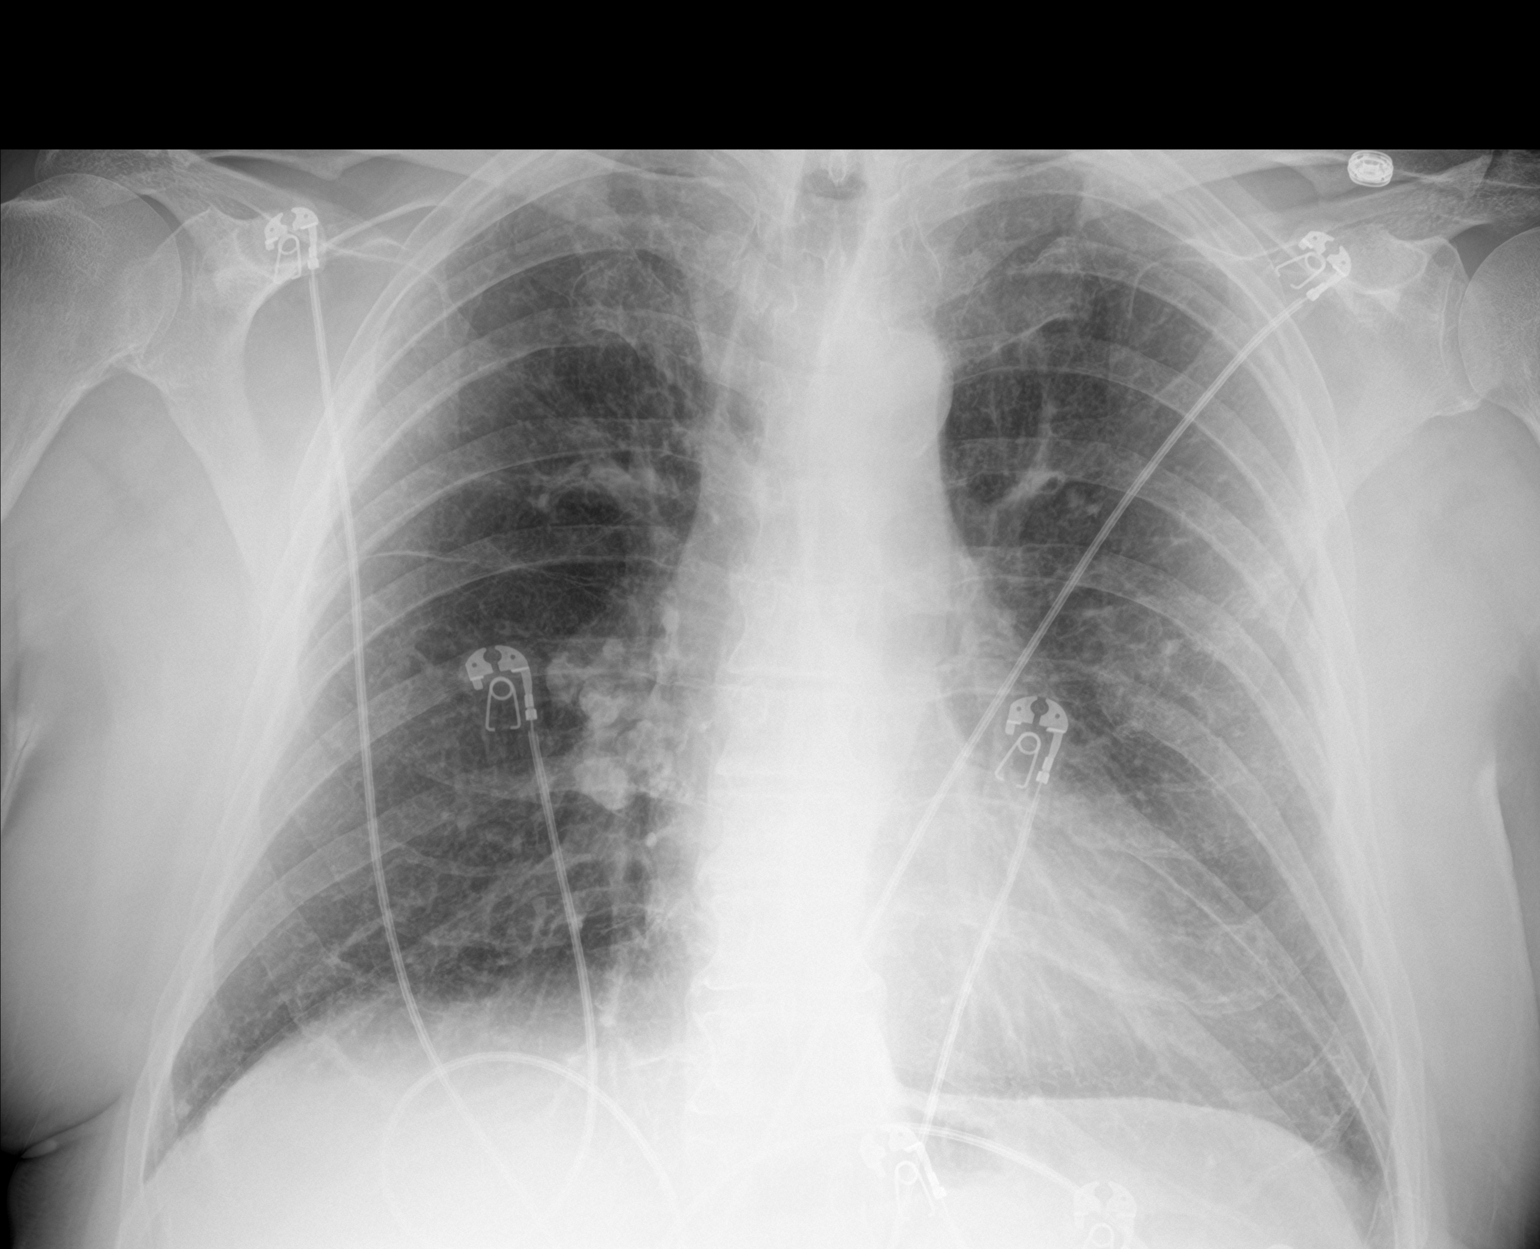

[chest lat]
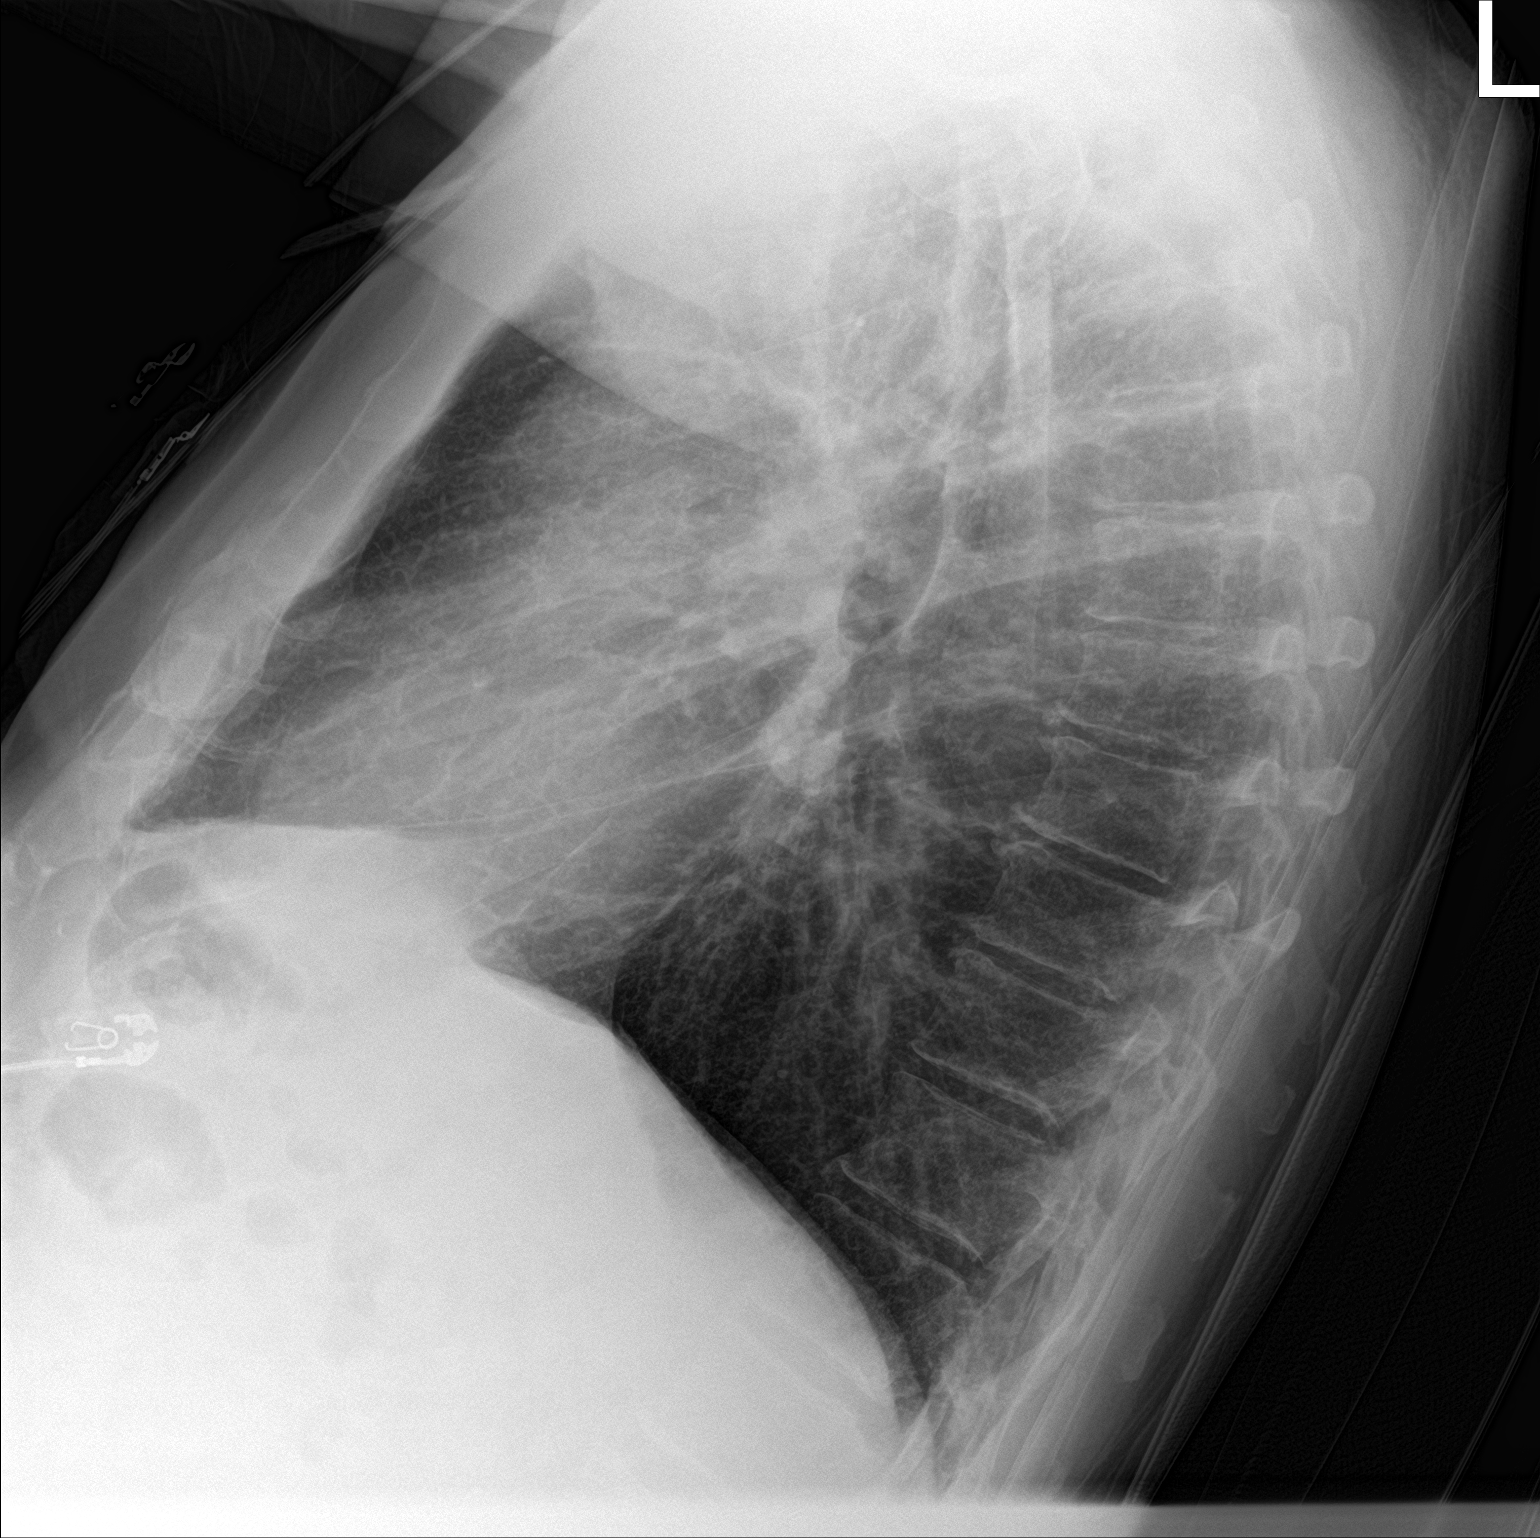

[2 of 2 positions shown; findings below may reference images not displayed]

FINDINGS: Normal heart size and stable mediastinal contours. Calcified right
hilar lymph nodes. There is no edema, consolidation, effusion, or
pneumothorax. Mild coarsening of lung markings which is stable and
likely scarring.
IMPRESSION: Stable from prior.  No evidence of acute disease.

## 2019-10-06 ENCOUNTER — Ambulatory Visit: Payer: Medicare HMO | Admitting: Cardiovascular Disease

## 2019-10-06 ENCOUNTER — Other Ambulatory Visit: Payer: Self-pay

## 2019-10-06 ENCOUNTER — Encounter: Payer: Self-pay | Admitting: Cardiovascular Disease

## 2019-10-06 ENCOUNTER — Telehealth: Payer: Self-pay | Admitting: Emergency Medicine

## 2019-10-06 ENCOUNTER — Encounter: Payer: Self-pay | Admitting: Nurse Practitioner

## 2019-10-06 VITALS — BP 122/72 | HR 95 | Ht 74.0 in | Wt 233.8 lb

## 2019-10-06 DIAGNOSIS — R0602 Shortness of breath: Secondary | ICD-10-CM

## 2019-10-06 LAB — LIPID PANEL
Chol/HDL Ratio: 6.6 ratio — ABNORMAL HIGH (ref 0.0–5.0)
Cholesterol, Total: 252 mg/dL — ABNORMAL HIGH (ref 100–199)
HDL: 38 mg/dL — ABNORMAL LOW (ref >39)
LDL Chol Calc (NIH): 165 mg/dL — ABNORMAL HIGH (ref 0–99)
Triglycerides: 258 mg/dL — ABNORMAL HIGH (ref 0–149)
VLDL Cholesterol Cal: 49 mg/dL — ABNORMAL HIGH (ref 5–40)

## 2019-10-06 LAB — CBC
Hematocrit: 44 % (ref 37.5–51.0)
Hemoglobin: 15.5 g/dL (ref 13.0–17.7)
MCH: 34.4 pg — ABNORMAL HIGH (ref 26.6–33.0)
MCHC: 35.2 g/dL (ref 31.5–35.7)
MCV: 98 fL — ABNORMAL HIGH (ref 79–97)
Platelets: 212 10*3/uL (ref 150–450)
RBC: 4.51 x10E6/uL (ref 4.14–5.80)
RDW: 11.9 % (ref 11.6–15.4)
WBC: 9.9 10*3/uL (ref 3.4–10.8)

## 2019-10-06 LAB — BASIC METABOLIC PANEL
BUN/Creatinine Ratio: 16 (ref 10–24)
BUN: 18 mg/dL (ref 8–27)
CO2: 23 mmol/L (ref 20–29)
Calcium: 10.4 mg/dL — ABNORMAL HIGH (ref 8.6–10.2)
Chloride: 97 mmol/L (ref 96–106)
Creatinine, Ser: 1.1 mg/dL (ref 0.76–1.27)
GFR calc Af Amer: 75 mL/min/{1.73_m2} (ref >59)
GFR calc non Af Amer: 65 mL/min/{1.73_m2} (ref >59)
Glucose: 125 mg/dL — ABNORMAL HIGH (ref 65–99)
Potassium: 4.2 mmol/L (ref 3.5–5.2)
Sodium: 137 mmol/L (ref 134–144)

## 2019-10-06 LAB — HEPATIC FUNCTION PANEL
ALT: 28 IU/L (ref 0–44)
AST: 23 IU/L (ref 0–40)
Albumin: 4.5 g/dL (ref 3.7–4.7)
Alkaline Phosphatase: 81 IU/L (ref 39–117)
Bilirubin Total: 0.3 mg/dL (ref 0.0–1.2)
Bilirubin, Direct: 0.1 mg/dL (ref 0.00–0.40)
Total Protein: 7 g/dL (ref 6.0–8.5)

## 2019-10-06 LAB — TSH: TSH: 1.77 u[IU]/mL (ref 0.450–4.500)

## 2019-10-06 NOTE — Telephone Encounter (Signed)
Had to leave a voicemail for patient daughter Zaveon Gillen. Advised of the response received and to call our office if they decide he wants to try the [place]. OR wants to continue with the nebulizer solution as mentioned.  Advised we will let her know if we are able to get any samples of Yupelri.

## 2019-10-06 NOTE — Telephone Encounter (Signed)
Message routed to app of the day, Wyn Quaker, NP  I called back and spoke with the patient's daughter Timonthy Hovater. Advised we do not have any samples of Yupelri in office. She stated he has one day left of the medication. And the out of pocket cost is over $200.00  I told her I would forward a request to see if there is an alternative medication that he can take until he can get the Yupelri (or if samples are received).   I offered the contact information for the manufacturer [employer] to obtain patient assistance and advised if he receives the forms to sign and provide appropriate information and bring forms here to be signed. She voiced understanding.  Aaron Edelman this patient was last seen by Dr. Lamonte Sakai 08/16/19. And seen by his cardiology office today.  Can there be an alternative sent in since we do not have any Yupelri in office?

## 2019-10-06 NOTE — Telephone Encounter (Signed)
10/06/2019 1726  Unfortunately there are not any other alternatives for nebulized meds to Yupelri that we would be able to have samples of.  They do make a inhaled glycopyrrolate but that requires a specific nebulized inhaler and we do not have samples of that.  I would recommend that we can either provide the patient with samples of Stiolto Respimat which would be a long-acting version of a muscarinic as well as a long-acting bronchodilator.  The patient could then continue using Pulmicort nebs as prescribed.  Or patient can use the Pulmicort nebs and use DuoNeb nebulized meds every 6 hours.  These would be the 2 options.  They can always check back in with [place] to see if we get more Yupelri samples then.  We can also check with Lauren to see if the [employer] rep has been in contact with her to see if we are going to get any more samples anytime soon.  Wyn Quaker, FNP

## 2019-10-06 NOTE — Progress Notes (Signed)
Cardiology Office Note:    Date:  10/06/2019   ID:  Tristan Le, DOB June 05, 1941, MRN [id number]  PCP:  [name], [place]  Cardiologist:  Bryann Mcnealy  Electrophysiologist:  None   Referring MD: Collene Gobble, MD   Chief Complaint  Patient presents with  . Shortness of Breath    History of Present Illness:    Tristan Le is a 78 y.o. male with a hx of   progressive dyspnea.   Thought to be due to COPD but PFTs are normal .   We were asked to see him today by Dr. Lamonte Sakai for further evaluation of the shortness of breath.  Dyspnea started 6-7 months ago .  He quit smoking when he became so short of breath. No episodes of chest pain or chest pressure.  He does not get any regular exercise. No yard work .   Now gets fatigued with walking to his car   Has never really been one to exercise,  Was active.   PFT's in Aug.  2020 were normal .   Wife has dementia - he is here primarily to ensure that he is healthy enough to take care of his wife.   Has not slept well for the past 3 months (corresponds to acute worsening of his wife's dementia  Gets 2-3 hours of sleep at night, then tries to get a nap .      Past Medical History:  Diagnosis Date  . Arthritis   . Chest pain   . COPD (chronic obstructive pulmonary disease) ([place]) 05/18/2019  . Cough 05/18/2019   06/13/2019-AFB-negative Fungal culture-showing yeast Respiratory sputum culture-negative   . GERD (gastroesophageal reflux disease)   . Hyperlipidemia   . Hypertension     History reviewed. No pertinent surgical history.  Current Medications: Current Meds  Medication Sig  . albuterol (VENTOLIN HFA) 108 (90 Base) MCG/ACT inhaler Inhale 1-2 puffs into the lungs daily as needed for shortness of breath.  . diphenhydrAMINE (BENADRYL) 25 MG tablet Take 25 mg by mouth daily.  . hydrochlorothiazide (MICROZIDE) 12.5 MG capsule Take 1 capsule (12.5 mg total) by mouth daily.  Marland Kitchen ipratropium-albuterol (DUONEB) 0.5-2.5 (3)  MG/3ML SOLN Take 3 mLs by nebulization every 6 (six) hours as needed (shortness of breath/wheezing).  . irbesartan (AVAPRO) 75 MG tablet Take 1 tablet (75 mg total) by mouth daily.  Marland Kitchen omega-3 acid ethyl esters (LOVAZA) 1 g capsule Take 2 capsules by mouth daily.  . pantoprazole (PROTONIX) 40 MG tablet Take 40 mg by mouth daily.  Marland Kitchen Respiratory Therapy Supplies (FLUTTER) DEVI Use as directed  . Revefenacin (YUPELRI) 175 MCG/3ML SOLN Inhale 1 vial into the lungs daily.   Current Facility-Administered Medications for the 10/06/19 encounter (Office Visit) with Lindy Le, Wonda Cheng, MD  Medication  . 0.9 %  sodium chloride infusion     Allergies:   Penicillins and Sulfonamide derivatives   Social History   Socioeconomic History  . Marital status: Married    Spouse name: Not on file  . Number of children: 4  . Years of education: Not on file  . Highest education level: Not on file  Occupational History  . Occupation: Retired  Scientific laboratory technician  . Financial resource strain: Not on file  . Food insecurity    Worry: Not on file    Inability: Not on file  . Transportation needs    Medical: Not on file    Non-medical: Not on file  Tobacco Use  . Smoking  status: Former Smoker    Packs/day: 0.50    Years: 60.00    Pack years: 30.00    Types: Cigarettes    Quit date: 04/27/2019    Years since quitting: 0.4  . Smokeless tobacco: Never Used  Substance and Sexual Activity  . Alcohol use: No  . Drug use: No  . Sexual activity: Not on file  Lifestyle  . Physical activity    Days per week: Not on file    Minutes per session: Not on file  . Stress: Not on file  Relationships  . Social Musicianconnections    Talks on phone: Not on file    Gets together: Not on file    Attends religious service: Not on file    Active member of club or organization: Not on file    Attends meetings of clubs or organizations: Not on file    Relationship status: Not on file  Other Topics Concern  . Not on file  Social  History Narrative  . Not on file     Family History: The patient's family history includes Heart disease in his brother and father; Hyperlipidemia in his brother.  ROS:   Please see the history of present illness.     All other systems reviewed and are negative.  EKGs/Labs/Other Studies Reviewed:    The following studies were reviewed today:   EKG:   Oct. 8 , 2020: Normal sinus rhythm at 91.  Normal EKG.  Recent Labs: 10/06/2019: ALT 28; BUN 18; Creatinine, Ser 1.10; Hemoglobin 15.5; Platelets 212; Potassium 4.2; Sodium 137; TSH 1.770  Recent Lipid Panel    Component Value Date/Time   CHOL 252 (H) 10/06/2019 1107   TRIG 258 (H) 10/06/2019 1107   HDL 38 (L) 10/06/2019 1107   CHOLHDL 6.6 (H) 10/06/2019 1107   CHOLHDL 6.0 08/10/2010 0157   VLDL 42 (H) 08/10/2010 0157   LDLCALC 165 (H) 10/06/2019 1107    Physical Exam:    VS:  BP 122/72   Pulse 95   Ht 6\' 2"  (1.88 m)   Wt 233 lb 12.8 oz (106.1 kg)   SpO2 95%   BMI 30.02 kg/m     Wt Readings from Last 3 Encounters:  10/06/19 233 lb 12.8 oz (106.1 kg)  07/13/19 222 lb 12.8 oz (101.1 kg)  05/18/19 217 lb (98.4 kg)     GEN:  Well nourished, well developed in no acute distress HEENT: Normal NECK: No JVD; No carotid bruits LYMPHATICS: No lymphadenopathy CARDIAC: RRR, no murmurs, rubs, gallops RESPIRATORY:  Clear to auscultation without rales, wheezing or rhonchi  ABDOMEN: Soft, non-tender, non-distended MUSCULOSKELETAL:  No edema; No deformity  SKIN: Warm and dry NEUROLOGIC:  Alert and oriented x 3 PSYCHIATRIC:  Normal affect   ASSESSMENT:    1. Shortness of breath    PLAN:    In order of problems listed above:  1. Shortness of breath: The patient was referred to us for further evaluation of his shortness of breath.  He is thought to have COPD but his pulmonary function test are normal.  I would like to get additional lab work.  His last hemoglobin was 17 .  We will repeat his CBC.  We will also evaluate him  with a basic metabolic profile, TSH, liver enzymes, lipid profile.  We will get an echocardiogram as well as a Lexiscan Myoview study.  We considered pulmonary embolus as a possible etiology but he does not report any episodes of pleuritic chest pain  in fact he has no chest pain at all.  He does not have any leg swelling.  I will see him again in 3 months for follow-up visit.   Medication Adjustments/Labs and Tests Ordered: Current medicines are reviewed at length with the patient today.  Concerns regarding medicines are outlined above.  Orders Placed This Encounter  Procedures  . CBC  . Basic Metabolic Panel (BMET)  . Hepatic function panel  . Lipid Profile  . TSH  . MYOCARDIAL PERFUSION IMAGING  . EKG 12-Lead  . ECHOCARDIOGRAM COMPLETE   No orders of the defined types were placed in this encounter.   Patient Instructions  Medication Instructions:  Your physician recommends that you continue on your current medications as directed. Please refer to the Current Medication list given to you today.  If you need a refill on your cardiac medications before your next appointment, please call your pharmacy.   Lab work: TODAY - CBC, BMET, TSH, Cholesterol, Liver panel  If you have labs (blood work) drawn today and your tests are completely normal, you will receive your results only by: Marland Kitchen MyChart Message (if you have MyChart) OR . A paper copy in the mail If you have any lab test that is abnormal or we need to change your treatment, we will call you to review the results.   Testing/Procedures: Your physician has requested that you have an echocardiogram. Echocardiography is a painless test that uses sound waves to create images of your heart. It provides your doctor with information about the size and shape of your heart and how well your heart's chambers and valves are working. This procedure takes approximately one hour. There are no restrictions for this procedure.  Your  physician has requested that you have a lexiscan myoview. For further information please visit [email or web address] Please follow instruction sheet, as given.    Follow-Up: At [facility], you and your health needs are our priority.  As part of our continuing mission to provide you with exceptional heart care, we have created designated Provider Care Teams.  These Care Teams include your primary Cardiologist (physician) and Advanced Practice Providers (APPs -  Physician Assistants and Nurse Practitioners) who all work together to provide you with the care you need, when you need it. You will need a follow up appointment in:  3 months.  You may see Dr. Elease Hashimoto or one of the following Advanced Practice Providers on your designated Care Team: Tereso Newcomer, PA-C [place], [place] . Berton Bon, NP      Signed, Kristeen Miss, MD  10/06/2019 5:46 PM    [facility]

## 2019-10-06 NOTE — Patient Instructions (Signed)
Medication Instructions:  Your physician recommends that you continue on your current medications as directed. Please refer to the Current Medication list given to you today.  If you need a refill on your cardiac medications before your next appointment, please call your pharmacy.   Lab work: TODAY - CBC, BMET, TSH, Cholesterol, Liver panel  If you have labs (blood work) drawn today and your tests are completely normal, you will receive your results only by: Marland Kitchen MyChart Message (if you have MyChart) OR . A paper copy in the mail If you have any lab test that is abnormal or we need to change your treatment, we will call you to review the results.   Testing/Procedures: Your physician has requested that you have an echocardiogram. Echocardiography is a painless test that uses sound waves to create images of your heart. It provides your doctor with information about the size and shape of your heart and how well your heart's chambers and valves are working. This procedure takes approximately one hour. There are no restrictions for this procedure.  Your physician has requested that you have a lexiscan myoview. For further information please visit [email or web address]. Please follow instruction sheet, as given.    Follow-Up: At [facility], you and your health needs are our priority.  As part of our continuing mission to provide you with exceptional heart care, we have created designated Provider Care Teams.  These Care Teams include your primary Cardiologist (physician) and Advanced Practice Providers (APPs -  Physician Assistants and Nurse Practitioners) who all work together to provide you with the care you need, when you need it. You will need a follow up appointment in:  3 months.  You may see Dr. Acie Fredrickson or one of the following Advanced Practice Providers on your designated Care Team: Richardson Dopp, PA-C [place], [place] . Daune Perch, NP

## 2019-10-07 ENCOUNTER — Telehealth (HOSPITAL_COMMUNITY): Payer: Self-pay | Admitting: *Deleted

## 2019-10-07 ENCOUNTER — Encounter

## 2019-10-07 ENCOUNTER — Telehealth: Payer: Self-pay | Admitting: Nurse Practitioner

## 2019-10-07 DIAGNOSIS — R0602 Shortness of breath: Secondary | ICD-10-CM

## 2019-10-07 DIAGNOSIS — E782 Mixed hyperlipidemia: Secondary | ICD-10-CM

## 2019-10-07 NOTE — Telephone Encounter (Signed)
Sample form was faxed out on Monday 10/03/19. Hoping to see delivery around Wednesday of the next week 10/12/19

## 2019-10-07 NOTE — Telephone Encounter (Signed)
-----   Message from Thayer Headings, MD sent at 10/06/2019  5:28 PM EDT ----- CBC is within normal limits. Basic metabolic profile reveals mild elevation of his glucose levels and calcium levels but otherwise the basic metabolic profile is stable. Liver enzymes are stable. Cholesterol levels are significantly elevated.  His LDL is 165.  HDL is 38.  Triglyceride level is 258.  Total cholesterol is 252. Please start rosuvastatin 10 mg a day.  Check lipids. LFTs,  again in 3 months.

## 2019-10-07 NOTE — Telephone Encounter (Signed)
Reviewed lab results and plan of care with patient who verbalized understanding.  Reports he had a total cholesterol of 330 many years ago and took Crestor for many years. He has not taken any statin for 6 years. States his arms started to hurt so much that he could not carry anything over 10 lbs. When he stopped Crestor, the problem was resolved.  States he has not taken Lovaza as directed but he has been taking it occasionally; he agrees to increase to prescribed dose.  We discussed options of having an appointment with PharmD for management of high cholesterol or having a calcium score. He would like to have a calcium score and then determine next steps regarding medications. I advised that someone will call him back to schedule the CT. He has additional tests ordered for Oct. 13 and is aware I will call him back with those results. He thanked me for the call.

## 2019-10-07 NOTE — Telephone Encounter (Signed)
Patient given detailed instructions per Myocardial Perfusion Study Information Sheet for the test on 10/11/19 at 10:30. Patient notified to arrive 15 minutes early and that it is imperative to arrive on time for appointment to keep from having the test rescheduled.  If you need to cancel or reschedule your appointment, please call the office within 24 hours of your appointment. . Patient verbalized understanding.Tristan Le

## 2019-10-09 IMAGING — DX PORTABLE CHEST - 1 VIEW
1 series · 1 of 1 positions shown · non-contrast
Comparison: May 06, 2019

CLINICAL DATA: Cough and shortness of breath.

EXAM:
PORTABLE CHEST 1 VIEW

[chest]
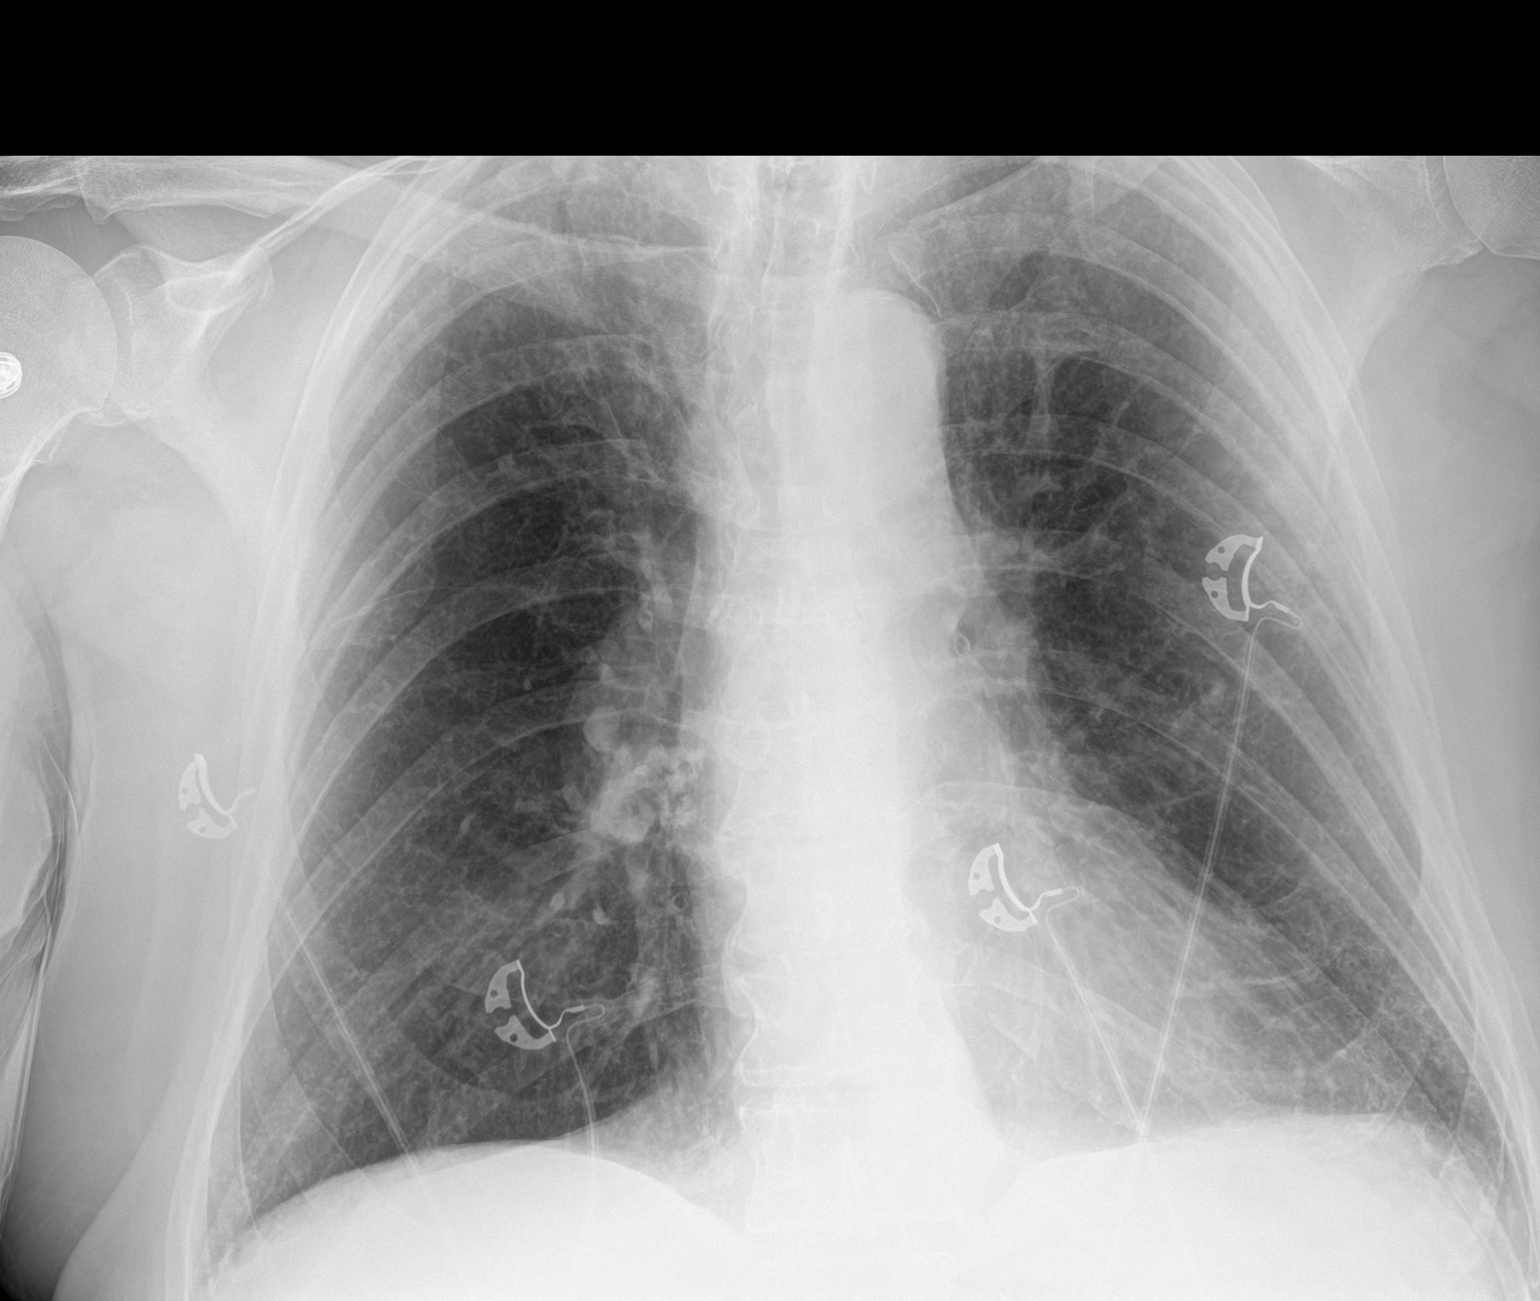

[1 of 1 positions shown; findings below may reference images not displayed]

FINDINGS: The heart size and mediastinal contours are within normal limits.
Both lungs are clear. The visualized skeletal structures are
unremarkable.
IMPRESSION: No active disease.

## 2019-10-10 ENCOUNTER — Other Ambulatory Visit: Payer: Self-pay

## 2019-10-10 NOTE — Telephone Encounter (Signed)
I have read and agree with the Note by Christen Bame, RN  Will wait and review the coronary calcium score and then decide on further meds.

## 2019-10-11 ENCOUNTER — Other Ambulatory Visit: Payer: Self-pay

## 2019-10-11 ENCOUNTER — Ambulatory Visit (HOSPITAL_BASED_OUTPATIENT_CLINIC_OR_DEPARTMENT_OTHER): Payer: Medicare HMO

## 2019-10-11 ENCOUNTER — Ambulatory Visit (HOSPITAL_COMMUNITY): Payer: Medicare HMO | Attending: Internal Medicine

## 2019-10-11 DIAGNOSIS — R0602 Shortness of breath: Secondary | ICD-10-CM

## 2019-10-11 LAB — MYOCARDIAL PERFUSION IMAGING
LV dias vol: 52 mL (ref 62–150)
LV sys vol: 18 mL
Peak HR: 114 {beats}/min
Rest HR: 95 {beats}/min
SDS: 1
SRS: 0
SSS: 1
TID: 0.9

## 2019-10-11 LAB — ECHOCARDIOGRAM COMPLETE
Height: 74 in
Weight: 3728 oz

## 2019-10-11 MED ORDER — TECHNETIUM TC 99M TETROFOSMIN IV KIT
10.7000 | PACK | Freq: Once | INTRAVENOUS | Status: AC | PRN
  Administered 2019-10-11: 10.7 via INTRAVENOUS
  Filled 2019-10-11: qty 11

## 2019-10-11 MED ORDER — REGADENOSON 0.4 MG/5ML IV SOLN
0.4000 mg | Freq: Once | INTRAVENOUS | Status: AC
  Administered 2019-10-11: 0.4 mg via INTRAVENOUS

## 2019-10-11 MED ORDER — TECHNETIUM TC 99M TETROFOSMIN IV KIT
32.4000 | PACK | Freq: Once | INTRAVENOUS | Status: AC | PRN
  Administered 2019-10-11: 32.4 via INTRAVENOUS
  Filled 2019-10-11: qty 33

## 2019-10-14 ENCOUNTER — Telehealth: Payer: Self-pay | Admitting: Emergency Medicine

## 2019-10-14 DIAGNOSIS — J441 Chronic obstructive pulmonary disease with (acute) exacerbation: Secondary | ICD-10-CM

## 2019-10-14 MED ORDER — BUDESONIDE 0.5 MG/2ML IN SUSP: 0.5000 mg | Freq: Two times a day (BID) | RESPIRATORY_TRACT | 1 refills | Status: DC

## 2019-10-14 NOTE — Telephone Encounter (Signed)
Spoke with pt and advised rx for Pulmicort was sent to [place], 90 day supply. Nothing further is needed.

## 2019-10-18 NOTE — Telephone Encounter (Signed)
Received samples 10/18/19, once logged will contact patient to pick up a box or 2

## 2019-10-27 ENCOUNTER — Other Ambulatory Visit: Payer: Self-pay

## 2019-10-27 ENCOUNTER — Ambulatory Visit (INDEPENDENT_AMBULATORY_CARE_PROVIDER_SITE_OTHER)
Admission: RE | Admit: 2019-10-27 | Discharge: 2019-10-27 | Disposition: A | Discharge status: Home / Self Care | Payer: Self-pay | Source: Ambulatory Visit | Attending: Cardiovascular Disease | Admitting: Cardiovascular Disease

## 2019-10-27 DIAGNOSIS — E782 Mixed hyperlipidemia: Secondary | ICD-10-CM

## 2019-10-27 DIAGNOSIS — R0602 Shortness of breath: Secondary | ICD-10-CM

## 2019-10-28 ENCOUNTER — Telehealth: Payer: Self-pay | Admitting: Emergency Medicine

## 2019-10-28 ENCOUNTER — Telehealth: Payer: Self-pay | Admitting: Nurse Practitioner

## 2019-10-28 DIAGNOSIS — R931 Abnormal findings on diagnostic imaging of heart and coronary circulation: Secondary | ICD-10-CM

## 2019-10-28 DIAGNOSIS — Z789 Other specified health status: Secondary | ICD-10-CM

## 2019-10-28 MED ORDER — YUPELRI 175 MCG/3ML IN SOLN: 3.0000 mL | Freq: Every day | RESPIRATORY_TRACT | 0 refills | Status: AC

## 2019-10-28 MED ORDER — YUPELRI 175 MCG/3ML IN SOLN: 3.0000 mL | Freq: Every day | RESPIRATORY_TRACT | 0 refills | Status: DC

## 2019-10-28 NOTE — Telephone Encounter (Signed)
-----   Message from Thayer Headings, MD sent at 10/28/2019  4:41 PM EDT ----- Calcium schore is 69 - 84th percentile of age / gender matched controlls   Lets see him in 3 months  Refer to lipid clinic for PCSK-9 inhibitor  He is intolerent to statins

## 2019-10-28 NOTE — Telephone Encounter (Signed)
Results reviewed with patient and daughter. Patient agrees to come in for Lipid Clinic appointment and wants his daughter to accompany him. I scheduled the appointment with his daughter and she agrees with plan and thanked me for the call

## 2019-10-28 NOTE — Telephone Encounter (Signed)
Call returned to daughter Katharine Look, requesting samples of Yupelri. Aware samples have been placed up front. She states the yupelri is too expensive, states it costs the payment $200 per month. I asked where did he get his nebulizer machine, informed it was through [facility]. I made her aware I would send in to [facility] to have it ran through their Medicare part B.   Sent medication to [facility]. Call made to pharmacy to inquire about price, spoke with pharmacist. She states they would not be able to quote a price until they ran it through the system. She states because his Mcarthur Rossetti is primary his co-pay would most likely be expensive. She states in the past with these patients with the same type of insurance it was $139 however she said this it not always the case. She states I should send it in and let them see what they can do. Medication sent. Will await f/u.   Samples placed upfront for pick up. Letter placed in bag updating patient of all the above information.   Nothing further needed at this time.

## 2019-10-30 NOTE — Progress Notes (Signed)
Patient ID: KEEDAN SAMPLE                 DOB: 1941-09-12                    MRN: [id number]     HPI: Tristan Le is a 78 y.o. male patient referred to lipid clinic by Dr. Acie Fredrickson. PMH is significant for HTN and HLD. Last seen by Dr. Acie Fredrickson on 10/06/19. Lipid panel collected at that appointment revealed LDL above goal at 165, TG above goal at 258. Patient had stated he tried Crestor in the past and was not able to lift things over 10 lbs due to muscle pain. Calcium score was done to determine next steps prior to referral to lipid clinic. Calcium score on 10/29 was 1612 - 84th percentile for age/gender matched controls.   He presents for initial visit to lipid clinic today. He is accompanied by his daughter. He complains he has been very short of breath recently and was recently diagnosed with COPD. He has tried statins in the past and said the muscle pain was very limiting. He is not interested in trying other statins moving forward. He says he has a hard time taking large pills, and this was ultimately why he self-discontinued niacin years ago.   Current Medications: Lovaza 2g daily  Intolerances: Crestor - myalgias, niacin (2011-2013) - self discontinued due to size, Lipitor - myalgias  Risk Factors: HTN, HLD, CAD with elevated calcium score  LDL goal: 70mg /dL  Exercise: very limited with shortness of breath  Family History: Heart disease in his brother and father; Hyperlipidemia in his brother.  Social History: Former smoker 1/2 PPD, quit in April 2020, denies alcohol and illicit drug use.  Labs: 10/06/19: TC 252, TG 258, HDL 38, LDL 165 - Lovaza 08/10/10: TC 205, TG 209, HDL 34, LDL 129 - none  Past Medical History:  Diagnosis Date  . Arthritis   . Chest pain   . COPD (chronic obstructive pulmonary disease) ([place]) 05/18/2019  . Cough 05/18/2019   06/13/2019-AFB-negative Fungal culture-showing yeast Respiratory sputum culture-negative   . GERD (gastroesophageal reflux disease)   .  Hyperlipidemia   . Hypertension     Current Outpatient Medications on File Prior to Visit  Medication Sig Dispense Refill  . albuterol (VENTOLIN HFA) 108 (90 Base) MCG/ACT inhaler Inhale 1-2 puffs into the lungs daily as needed for shortness of breath. 18 g 3  . budesonide (PULMICORT) 0.5 MG/2ML nebulizer solution Take 2 mLs (0.5 mg total) by nebulization 2 (two) times daily. 360 mL 1  . diphenhydrAMINE (BENADRYL) 25 MG tablet Take 25 mg by mouth daily.    . hydrochlorothiazide (MICROZIDE) 12.5 MG capsule Take 1 capsule (12.5 mg total) by mouth daily. 30 capsule 0  . ipratropium-albuterol (DUONEB) 0.5-2.5 (3) MG/3ML SOLN Take 3 mLs by nebulization every 6 (six) hours as needed (shortness of breath/wheezing). 360 mL 1  . irbesartan (AVAPRO) 75 MG tablet Take 1 tablet (75 mg total) by mouth daily. 90 tablet 1  . omega-3 acid ethyl esters (LOVAZA) 1 g capsule Take 2 capsules by mouth daily.    . pantoprazole (PROTONIX) 40 MG tablet Take 40 mg by mouth daily.    Marland Kitchen Respiratory Therapy Supplies (FLUTTER) DEVI Use as directed 1 each 0  . Revefenacin (YUPELRI) 175 MCG/3ML SOLN Inhale 1 vial into the lungs daily. 147 mL 0  . Revefenacin (YUPELRI) 175 MCG/3ML SOLN Inhale 3 mLs into the lungs daily. Kila  mL 0   Current Facility-Administered Medications on File Prior to Visit  Medication Dose Route Frequency Provider Last Rate Last Dose  . 0.9 %  sodium chloride infusion  500 mL Intravenous Once Nandigam, Eleonore Chiquito, MD        Allergies  Allergen Reactions  . Penicillins     REACTION: Upset stomach,mouth ulcers,rash  . Sulfonamide Derivatives     REACTION: Rash with huge blisters    Assessment/Plan:  1. Hyperlipidemia - LDL above goal at 165 (goal < 70), TG above goal at 258 (goal < 150) and calcium score elevated 1612 (84th percentile). Educated extensively about the importance of lipid lowering agents in prevention of heart attack and stroke, as well as plaque regression with PCSK9-inhibitors.  Will start Praluent 75 mg SQ once every two weeks. Will switch Lovaza to Vascepa for cardiovascular benefit. Patient has issues swallowing large pills so will start with Vascepa 0.5 g 4 capsules by mouth twice daily. Recheck lipid panel and LFTs in ~6 weeks. Application created for [employer] for [employer]. Prior authorization created for Praluent. Will send prescriptions to pharmacy once approval status obtained for each.  Danae Orleans, PharmD PGY2 Cardiology Pharmacy Resident [facility] [address], [place], [place] [address] Phone: [phone]; Fax: [phone]

## 2019-10-31 ENCOUNTER — Other Ambulatory Visit: Payer: Self-pay

## 2019-10-31 ENCOUNTER — Ambulatory Visit (INDEPENDENT_AMBULATORY_CARE_PROVIDER_SITE_OTHER): Payer: Medicare HMO

## 2019-10-31 DIAGNOSIS — E785 Hyperlipidemia, unspecified: Secondary | ICD-10-CM | POA: Insufficient documentation

## 2019-10-31 DIAGNOSIS — R931 Abnormal findings on diagnostic imaging of heart and coronary circulation: Secondary | ICD-10-CM

## 2019-10-31 MED ORDER — PRALUENT 75 MG/ML ~~LOC~~ SOAJ: 1.0000 "pen " | SUBCUTANEOUS | 3 refills | Status: DC

## 2019-10-31 MED ORDER — VASCEPA 0.5 G PO CAPS: 4.0000 | ORAL_CAPSULE | Freq: Two times a day (BID) | ORAL | 3 refills | Status: DC

## 2019-10-31 NOTE — Patient Instructions (Addendum)
Thank you for seeing [place] today!  Please stop taking your Lovaza and start taking Vascepa 4 (0.5g) capsules twice daily. We will submit your information to the grant program [facility]) and then will send the prescription to the pharmacy for you to pick up.   Start taking Praluent 1 injection (75 mg) every 2 weeks.   The alternative injectable medicine is called Repatha.  We will send the prior authorization paperwork to the insurance company and once we get approval, will send this prescription to the pharmacy and call you to notify you it is available.  We would like to recheck your cholesterol (fasting) on 12/07/19 any time after 7:30 in the morning.   Please call us at [phone] if you have any questions or concerns.

## 2019-11-01 ENCOUNTER — Telehealth: Payer: Self-pay

## 2019-11-01 NOTE — Telephone Encounter (Signed)
Prior authorization approved for Praluent. [employer] approved for both [employer]. Pharmacy has information for supplemental billing. Called pt to inform him of approval status and that medications should be ready to pick up from pharmacy tomorrow for $0 copay. Pt voiced understanding. Will follow up with pt in December following next lipid check.

## 2019-11-17 ENCOUNTER — Other Ambulatory Visit: Payer: Self-pay | Admitting: Emergency Medicine

## 2019-11-29 ENCOUNTER — Other Ambulatory Visit: Payer: Self-pay

## 2019-11-29 ENCOUNTER — Encounter: Payer: Self-pay | Admitting: Emergency Medicine

## 2019-11-29 ENCOUNTER — Ambulatory Visit (INDEPENDENT_AMBULATORY_CARE_PROVIDER_SITE_OTHER): Payer: Medicare HMO | Admitting: Emergency Medicine

## 2019-11-29 DIAGNOSIS — J410 Simple chronic bronchitis: Secondary | ICD-10-CM | POA: Diagnosis not present

## 2019-11-29 NOTE — Addendum Note (Signed)
Addended by: Jannette Spanner on: 11/29/2019 04:10 PM   Modules accepted: Orders

## 2019-11-29 NOTE — Progress Notes (Signed)
Virtual Visit via Telephone Note  I connected with LINCON SAHLIN on 11/29/19 at  3:00 PM EST by telephone and verified that I am speaking with the correct person using two identifiers.  Location: Patient: Home Provider: Office   I discussed the limitations, risks, security and privacy concerns of performing an evaluation and management service by telephone and the availability of in person appointments. I also discussed with the patient that there may be a patient responsible charge related to this service. The patient expressed understanding and agreed to proceed.   History of Present Illness: 78 year old former smoker with hypertension, hyperlipidemia, GERD, COPD.  He had mild obstruction on pulmonary function testing with some superimposed restriction and a decreased diffusion capacity that corrected the normal range for his alveolar volume.  He has been treated with scheduled prednisone, current dose is 5mg .  I have had him on initially DuoNeb and Pulmicort, then changed to [place].  He has been largely sample dependent, intermittently has had Stiolto.    Observations/Objective: Pt reports that he is currently on Yupelri + Budesonide. Minimal albuterol or DuoNeb use, has not needed. He tried decreasing his prednisone - found that his breathing worsened. Overall he is doing well - no cough, improved functional capacity but remains weak.   Assessment and Plan: Significant benefit from theYupelri although it is cost prohibitive, cost $200 a month.  He did not do as well on DuoNeb or any alternative and I would like to try and complete prior authorization to get the Yupelri since he has failed other meds.  I will try to give him some samples for now.  We will stay on prednisone 5 mg daily for now.  May be able to consider tapering at some point in the future once we have him on a stable bronchodilator regimen.  Follow Up Instructions: 2 months by phone or in office   I discussed the  assessment and treatment plan with the patient. The patient was provided an opportunity to ask questions and all were answered. The patient agreed with the plan and demonstrated an understanding of the instructions.   The patient was advised to call back or seek an in-person evaluation if the symptoms worsen or if the condition fails to improve as anticipated.  I provided 15 minutes of non-face-to-face time during this encounter.   Collene Gobble, MD

## 2019-12-02 ENCOUNTER — Other Ambulatory Visit: Payer: Self-pay | Admitting: Emergency Medicine

## 2019-12-02 DIAGNOSIS — J410 Simple chronic bronchitis: Secondary | ICD-10-CM

## 2019-12-06 ENCOUNTER — Telehealth: Payer: Self-pay | Admitting: Emergency Medicine

## 2019-12-06 DIAGNOSIS — Z79899 Other long term (current) drug therapy: Secondary | ICD-10-CM

## 2019-12-06 MED ORDER — YUPELRI 175 MCG/3ML IN SOLN: 3.0000 | Freq: Every day | RESPIRATORY_TRACT | 0 refills | Status: DC

## 2019-12-06 NOTE — Telephone Encounter (Addendum)
I spoke with pt;s daughter and advised her that I would leave the yuperli samples up front. She also stated that RB was supposed to be helping with getting the copay reduced but he has a supplemental insurance and they sometimes dont pay much on Yuperli. Dr. Lamonte Sakai do you have any suggestions for pt?

## 2019-12-06 NOTE — Telephone Encounter (Signed)
Patient's daughter is returning phone call.  Phone number is [phone].

## 2019-12-06 NOTE — Telephone Encounter (Signed)
I called pt's daughter and there was no answer. LM for her to call back about samples. We do have Yuperli samples.

## 2019-12-07 ENCOUNTER — Other Ambulatory Visit: Payer: Self-pay

## 2019-12-07 ENCOUNTER — Other Ambulatory Visit: Payer: Medicare HMO | Admitting: *Deleted

## 2019-12-07 DIAGNOSIS — R931 Abnormal findings on diagnostic imaging of heart and coronary circulation: Secondary | ICD-10-CM

## 2019-12-07 LAB — LIPID PANEL
Chol/HDL Ratio: 3.5 ratio (ref 0.0–5.0)
Cholesterol, Total: 149 mg/dL (ref 100–199)
HDL: 43 mg/dL (ref >39)
LDL Chol Calc (NIH): 77 mg/dL (ref 0–99)
Triglycerides: 172 mg/dL — ABNORMAL HIGH (ref 0–149)
VLDL Cholesterol Cal: 29 mg/dL (ref 5–40)

## 2019-12-07 LAB — HEPATIC FUNCTION PANEL
ALT: 32 IU/L (ref 0–44)
AST: 26 IU/L (ref 0–40)
Albumin: 4.7 g/dL (ref 3.7–4.7)
Alkaline Phosphatase: 78 IU/L (ref 39–117)
Bilirubin Total: 0.3 mg/dL (ref 0.0–1.2)
Bilirubin, Direct: 0.11 mg/dL (ref 0.00–0.40)
Total Protein: 7.2 g/dL (ref 6.0–8.5)

## 2019-12-07 LAB — LDL CHOLESTEROL, DIRECT: LDL Direct: 83 mg/dL (ref 0–99)

## 2019-12-07 NOTE — Telephone Encounter (Signed)
Spoke with pt's daughter, Katharine Look. She is aware that RB wants the pt referred to our pharmacy team. She agreed. Referral has been placed. Nothing further was needed.

## 2019-12-07 NOTE — Telephone Encounter (Signed)
I had hoped that we could refer and get the assistance from our clinical pharmacists - see if there are any assistance options. Please refer to them to get pharmacy input. Thanks.

## 2019-12-08 ENCOUNTER — Encounter: Payer: Self-pay | Admitting: Pharmacist

## 2019-12-08 ENCOUNTER — Telehealth: Payer: Self-pay | Admitting: Pharmacist

## 2019-12-08 IMAGING — DX CHEST - 2 VIEW
2 series · 2 of 2 positions shown · non-contrast
Comparison: 05/14/2019

CLINICAL DATA: COPD

EXAM:
CHEST - 2 VIEW

[chest pa]
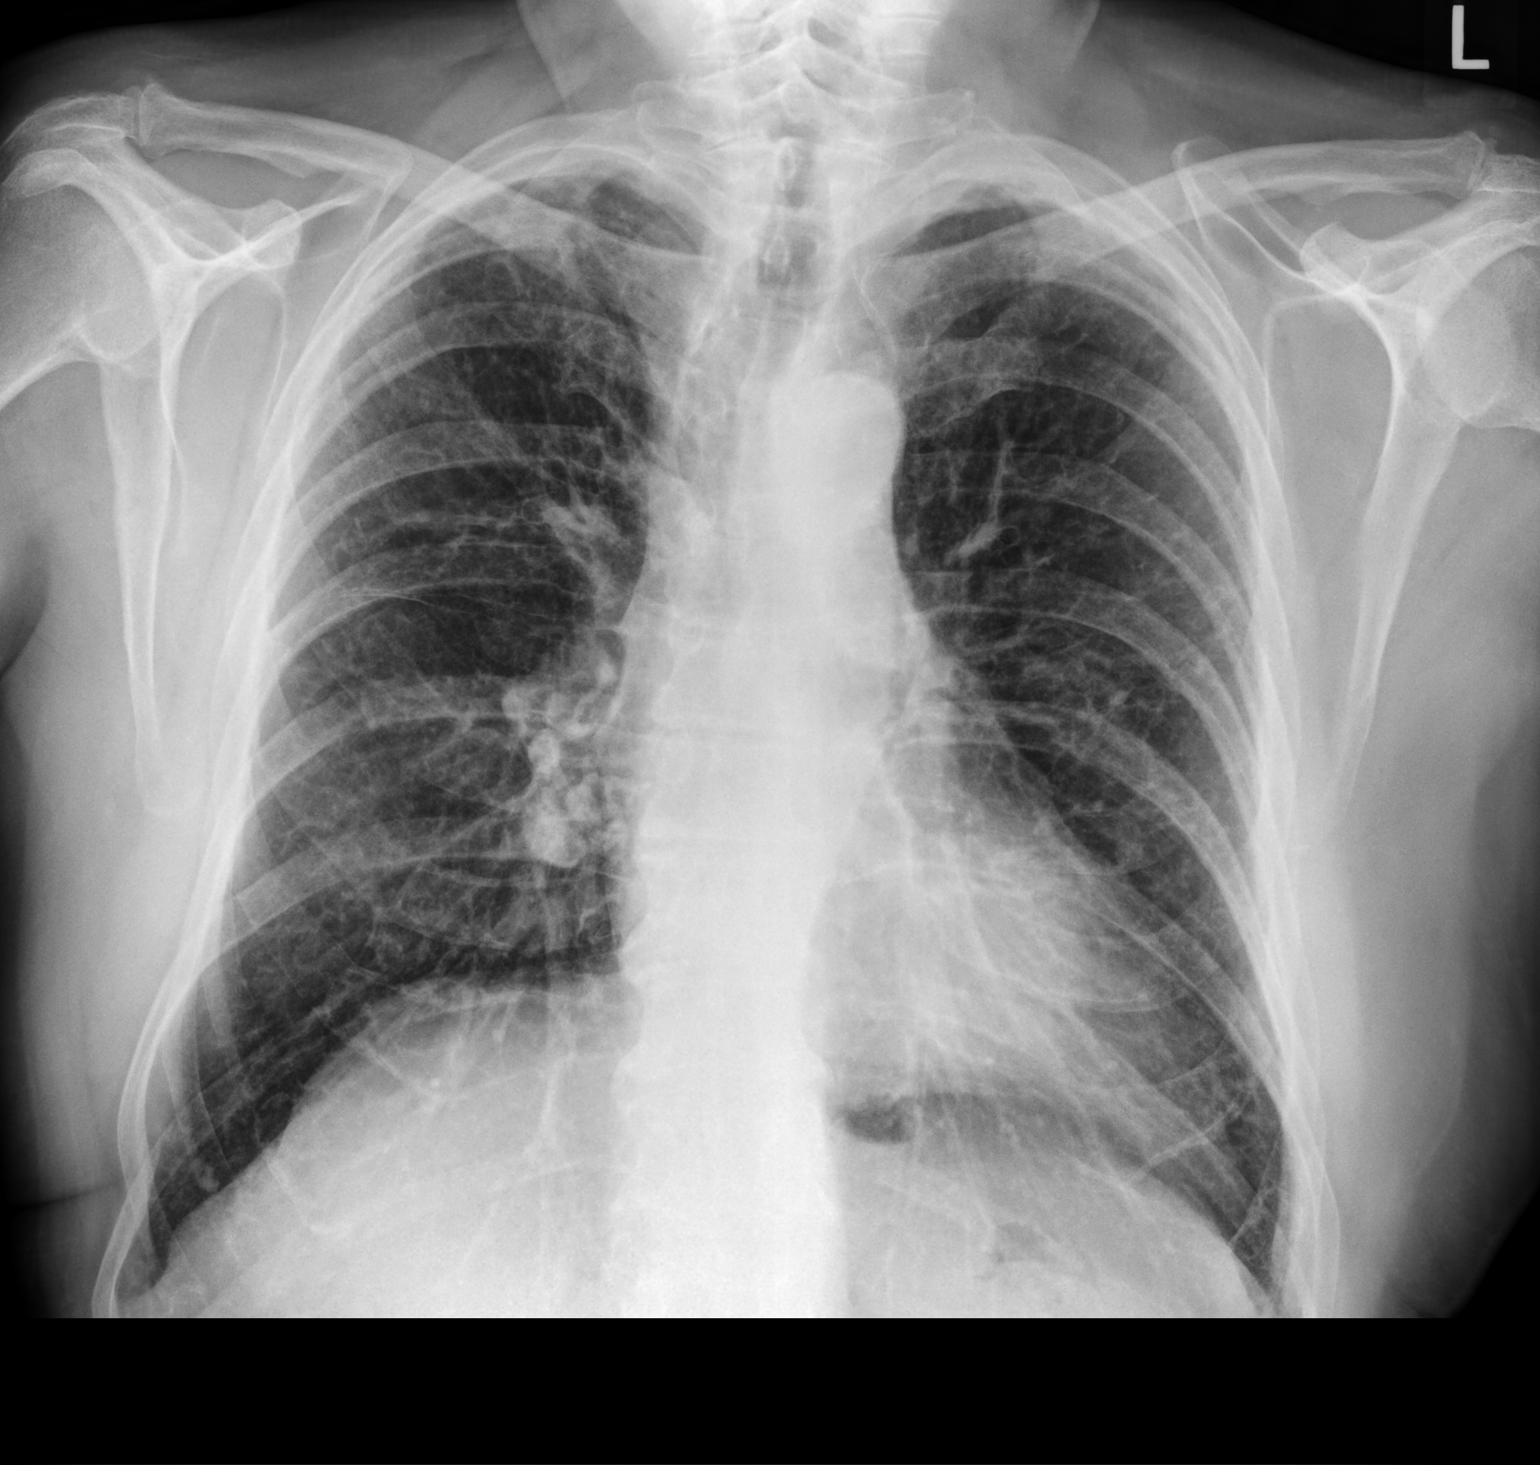

[chest lat]
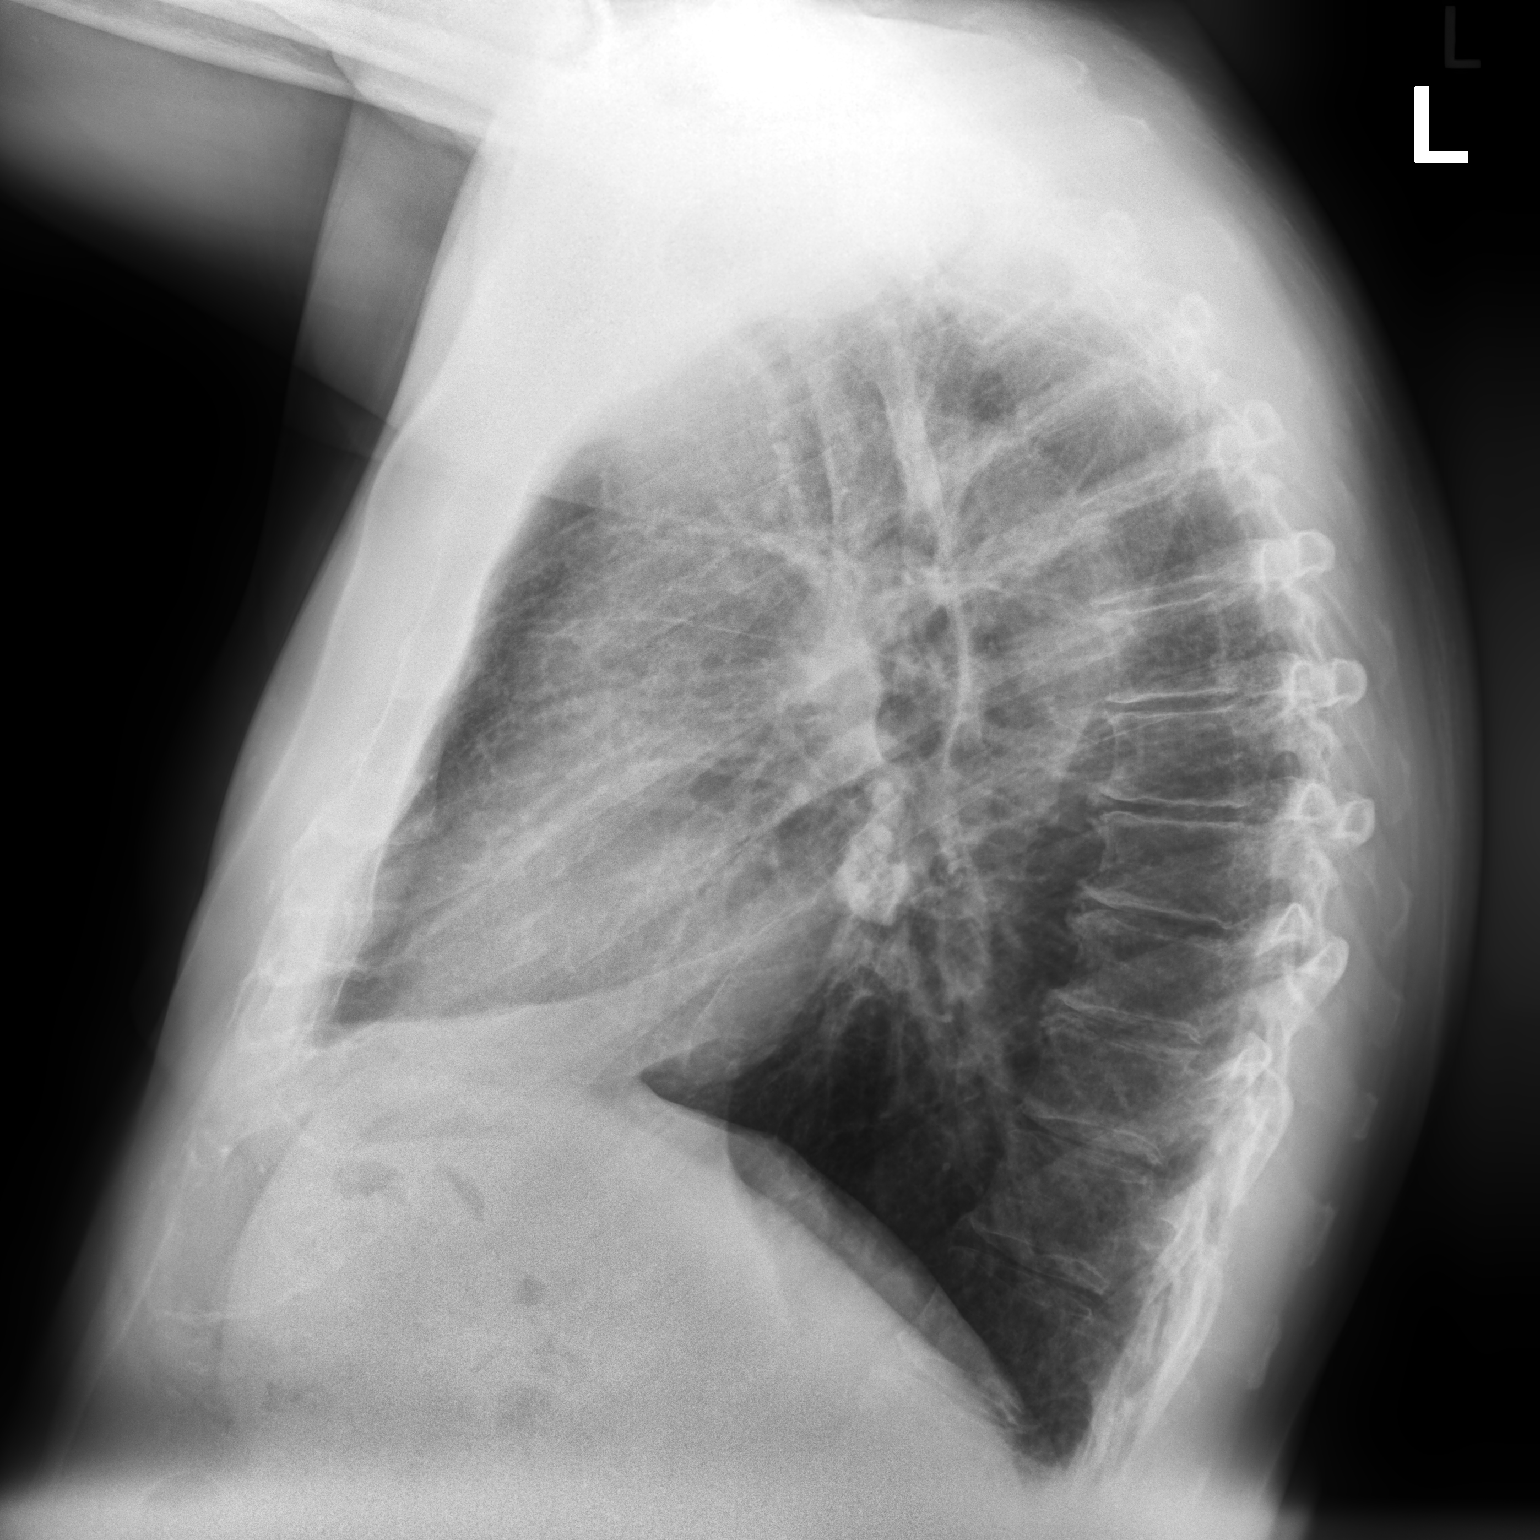

[2 of 2 positions shown; findings below may reference images not displayed]

FINDINGS: The heart size is normal. Calcified right hilar lymph nodes.
Emphysema. Disc degenerative disease of the thoracic spine.
IMPRESSION: Emphysema.  No acute abnormality of the lungs.

## 2019-12-08 MED ORDER — PRALUENT 150 MG/ML ~~LOC~~ SOAJ: 1.0000 "pen " | SUBCUTANEOUS | 11 refills | Status: DC

## 2019-12-08 NOTE — Telephone Encounter (Signed)
This encounter was created in error - please disregard.

## 2019-12-08 NOTE — Telephone Encounter (Signed)
LDL and TG much improved since starting Vascepa and Praluent. LDL remains slightly above goal < 70. Spoke with pt who is tolerating therapy well. He is willing to increase his Praluent dose to 150mg . New rx sent to pharmacy and he will call with any concerns.  Confirmed with pharmacy no new prior is needed for higher dose.

## 2019-12-17 NOTE — Progress Notes (Signed)
Subjective Patient presents today to [facility] to see pharmacy team for financial assistance. Patient was referred by Dr. Delton CoombesByrum on 11/29/2019. Past medical history includes arthritis, chest pain, cough, GERD, HTN, HLD, and simple chronic bronchitis. At prior appt with Dr. Delton CoombesByrum, it was noted that patient has had [facility], however, cost is prohibitive due to being $200 for a 30-day supply.  It appears Mikael SprayYupelri is billed via Medicare Part C and does not require a prior authorization. Patient will need supplement insurance to help with these costs. Since open enrollment is over (lasts Oct 15 - Dec 7) patient is required to remain on his current insurance plan for 2021 year frame. He can meet with Medicare and Seniors' [employer] Insurance Information Program [facility](SHIIP) for the 2022 year. There are no patient assistance programs available for Yupelri. The other LAMA nebulizer Modesto Charon([place]) is not covered by his insurance, therefore, is not an option. Considering a patient is using a nebulizer, optimal therapy options with include continuing budesonide nebs + Stiolto Respimat OR Sprivia Respimat. Stiolto Respimat and Spiriva Respimat cost $8.95 for 30-day supply.  Patient presents today for initial appt with pharmacy team. Patient uses budesonide nebs around 8AM and 8PM. He uses Yupelri around 12PM. He states he has used stiolto respimat in the past, but it made him cough. Patient thinks he may have coughed due to how deep of a breath he was taking using stiolto respimat. He is willing to pursue a retrial of respimat inhaler dosage formulation. 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  Respiratory medications Current: yupelri nebs, budesonide nebs, duoneb nebs, albuterol prn, prednisone Tried in past: pulmicort (hoarse throat and losing his voice), stiolto respimat (cough), atrovent HFA (switched to stiolto) Patient reports no known adherence challenges  COPD Questionnaire  CAT ASSESSMENT   Rank each of the following items on a scale of 0 to 5 (with 5 being most severe) Write a # 0-5 in each box  I never cough (0) > I cough all the time (5) 1  I have no phlegm (mucus) in my chest (0) > My chest is completely full of phlegm (mucus) (5) 2  My chest does not feel tight at all (0) > My chest feels very tight (5) 0  When I walk up a hill or one flight of stairs I am not breathless (0) > When I walk up a hill or one flight of stairs I am very breathless (5) 3  I am not limited doing any activities at home (0) > I am very limited doing activities at home (5) 3  I am confident leaving my home despite my lung function (0) > I am not at all confident leaving my home because of my lung condition (5)  0  I sleep soundly (0) > I don't sleep soundly because of my lung condition (5) 1  I have lots of energy (0) > I have no energy at all (5) 5   Total CAT Score: 15  Number of hospitilizations in the last year: 0  Number of COPD exacerbations in the last year: 3-4  Unable to determine GOLD Stage and group considering patient's PFTs were within normal range.   Objective Allergies  Allergen Reactions  . Penicillins     REACTION: Upset stomach,mouth ulcers,rash  . Sulfonamide Derivatives     REACTION: Rash with huge blisters    Outpatient Encounter Medications as of 12/19/2019  Medication Sig  . albuterol (VENTOLIN HFA) 108 (90 Base)  MCG/ACT inhaler Inhale 1-2 puffs into the lungs daily as needed for shortness of breath.  . Alirocumab (PRALUENT) 150 MG/ML SOAJ Inject 1 pen into the skin every 14 (fourteen) days.  . budesonide (PULMICORT) 0.5 MG/2ML nebulizer solution Take 2 mLs (0.5 mg total) by nebulization 2 (two) times daily.  . diphenhydrAMINE (BENADRYL) 25 MG tablet Take 25 mg by mouth daily.  . hydrochlorothiazide (MICROZIDE) 12.5 MG capsule Take 1 capsule (12.5 mg total) by mouth daily.  Bess Harvest Ethyl (VASCEPA) 0.5 g CAPS Take 4 capsules by mouth 2 (two) times daily.  Marland Kitchen  ipratropium-albuterol (DUONEB) 0.5-2.5 (3) MG/3ML SOLN Take 3 mLs by nebulization every 6 (six) hours as needed (shortness of breath/wheezing).  . irbesartan (AVAPRO) 75 MG tablet Take 1 tablet by mouth once daily  . pantoprazole (PROTONIX) 40 MG tablet Take 40 mg by mouth daily.  . predniSONE (DELTASONE) 10 MG tablet TAKE 1/2 (ONE-HALF) TABLET BY MOUTH ONCE DAILY WITH BREAKFAST  . Respiratory Therapy Supplies (FLUTTER) DEVI Use as directed  . Revefenacin (YUPELRI) 175 MCG/3ML SOLN Inhale 1 vial into the lungs daily.  . Revefenacin (YUPELRI) 175 MCG/3ML SOLN Inhale 3 vials into the lungs daily.   Facility-Administered Encounter Medications as of 12/19/2019  Medication  . 0.9 %  sodium chloride infusion      There is no immunization history on file for this patient.   PFTs 08/16/2019 - within normal limits  Eosinophils Most recent blood eosinophil count was 0.7 cells/microL taken on 05/06/2019.   Chest X-ray 07/13/2019 - stable, showing emphysema. 05/06/2019 and 05/14/2019 - show no infiltrates, some flattening of the bilateral hemidiaphragms consistent with some mild hyperinflation, very subtle peripheral basilar interstitial prominence  In-Check Dial G16 Inspiratory flow measured using the In-check DIAL G16 and was in range of 20-60 for use of pMDI device. Patient scored 40 after training.      Assessment and Plan  1. Inhaler Optimization (Spiriva Respimat) Discussed with patient how Mikael Spray is billed via Medicare Part C and does not require a prior authorization. Patient will need supplement insurance to help with these costs. Since open enrollment is over (lasts Oct 15 - Dec 7) patient is required to remain on his current insurance plan for 2021 year frame. There are no other options for financial assistance for Yupelri. Modesto Charon is not covered via his insurance. For the 2021 year, patient's other option for LAMA therapy would be Spiriva Respimat, which would cost $8.95 for 30-day supply.  Patient verbalized understanding. Patient remembers using Respimat dosage formulation in the past, but had issues with inhalation due to cough. He thinks cough may be due to taking too deep of a breath. Patient is willing to re-try Respimat formulation after inhaler education using In-Check Dial. Patient finds $8.95 copay for 30-day supply affordable. Discontinue Yupelri. Inititate Spiriva Respimat 2.5 mcg 2 puffs once daily (total = 5 mcg).   Patient was counseled on the purpose, proper use, and adverse effects of Spiriva Respimat inhaler. Patient verbalized understanding.  Reviewed appropriate use of maintenance vs rescue inhalers.  Stressed importance of using maintenance inhaler daily and rescue inhaler only as needed.  Patient verbalized understanding.  Demonstrated proper inhaler technique using Respimat demo inhaler.  Patient able to demonstrate proper inhaler technique using teach back method. Spiriva Respimat patient education handout provided.  2. Medication Reconciliation A drug regimen assessment was performed, including review of allergies, interactions, disease-state management, dosing and immunization history. Medications were reviewed with the patient, including name, instructions, indication, goals of  therapy, potential side effects, importance of adherence, and safe use. -Initiated 1. Spiriva  -Discontinued 1. Yupelri 2. Pantoprazole - Patient stated he is not using pantoprazole. He was using it for management of heartburn. He is not having issues with heartburn at this time. He has not been using pantoprazole due to concerns with pantoprazole causing COVID-19. Discussed with patient that I was not familiar with COVID-19 correlation with pantoprazole use, however, PPI use is related to other risks (Clostridium difficile infection and bone loss and fractures) so it is appropriate to D/C pantoprazole use if he is willing. Patient verbalized understanding and stated he would stop using  pantoprazole.  3. Immunizations It appears patient is a candidate for pneumovax-23, high dose influenzae, and shringrix vaccinations. Informed patient he is due for these vaccinations and advised patient to get these at his local pharmacy (do not require prescription). Patient verbalized understanding.  Thank you for involving pharmacy to assist in providing Mr. Culp care.   Drexel Iha, PharmD PGY2 Ambulatory Care Pharmacy Resident

## 2019-12-19 ENCOUNTER — Ambulatory Visit (INDEPENDENT_AMBULATORY_CARE_PROVIDER_SITE_OTHER): Payer: Medicare HMO | Admitting: Pharmacist

## 2019-12-19 ENCOUNTER — Other Ambulatory Visit: Payer: Self-pay

## 2019-12-19 DIAGNOSIS — J410 Simple chronic bronchitis: Secondary | ICD-10-CM

## 2019-12-19 MED ORDER — SPIRIVA RESPIMAT 2.5 MCG/ACT IN AERS: 2.5000 ug | INHALATION_SPRAY | Freq: Every day | RESPIRATORY_TRACT | 11 refills | Status: DC

## 2019-12-19 MED ORDER — SPIRIVA RESPIMAT 2.5 MCG/ACT IN AERS: 5.0000 ug | INHALATION_SPRAY | Freq: Every day | RESPIRATORY_TRACT | 11 refills | Status: DC

## 2019-12-19 NOTE — Patient Instructions (Addendum)
It was a pleasure seeing you in clinic today Tristan Le!  Today the plan is...  1. Finish using the [place]. Maretta Bees is billed via Medicare Part C and does not require a prior authorization. You will need supplement insurance to help with these costs. Since open enrollment is over (lasts Oct 15 - Dec 7) you are required to remain on his current insurance plan for 2021 year frame. You can meet with [facility]) for the 2022 year. SHIIP information provided.  2. Once you finish Yupelri, start Spiriva Respimat 2 puffs once daily. Try to separate puffs by about 1 minute. This medication should be ~$8.95 for 30-day supply. Handout provided.  3. Continue budesonide nebulizer treatment twice daily. Remember to rinse your mouth out with water afterwards!  Please call the PharmD clinic at [phone] if you have any questions that you would like to speak with a pharmacist about Tristan Le, [name]).

## 2019-12-27 ENCOUNTER — Telehealth: Payer: Self-pay | Admitting: Cardiovascular Disease

## 2019-12-27 NOTE — Telephone Encounter (Signed)
Pt c/o medication issue:  1. Name of Medication: hydrochlorothiazide (MICROZIDE) 12.5 MG capsule / irbesartan (AVAPRO) 75 MG tablet  2. How are you currently taking this medication (dosage and times per day)? As directed  3. Are you having a reaction (difficulty breathing--STAT)? no  4. What is your medication issue? Patients daughter calling in to state that her father has been having hip and joint pain. He feels weak and has no strength to walk from one room to the other. She was not with him so could not give me a BP reading but she stated that he said his BP seemed fine when he checked it.

## 2019-12-27 NOTE — Telephone Encounter (Signed)
Spoke with pt.  He has been having issue with weakness in hips and legs for a couple of month, it has been gradually getting worse.  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  Pt has a PCP OV tomorrow, recommended he discuss with PCP.

## 2019-12-30 ENCOUNTER — Other Ambulatory Visit: Payer: Self-pay | Admitting: Emergency Medicine

## 2019-12-30 DIAGNOSIS — J410 Simple chronic bronchitis: Secondary | ICD-10-CM

## 2020-01-09 ENCOUNTER — Other Ambulatory Visit: Payer: Self-pay | Admitting: *Deleted

## 2020-01-09 DIAGNOSIS — J410 Simple chronic bronchitis: Secondary | ICD-10-CM

## 2020-01-09 MED ORDER — PREDNISONE 10 MG PO TABS: ORAL_TABLET | ORAL | 2 refills | Status: DC

## 2020-01-10 ENCOUNTER — Telehealth: Payer: Self-pay | Admitting: Cardiovascular Disease

## 2020-01-10 NOTE — Telephone Encounter (Signed)
New Message:      Pt is scheduled to see Dr Elease Hashimoto on Thursday(01-12-20). The daughter wants to know if that an appt can be Virtual please, pt have been exposed to COVID.

## 2020-01-10 NOTE — Telephone Encounter (Signed)
Spoke with patient's daughter, Katharine Look, and reviewed consent for evisit. She states patient has scales and home BP cuff and will record pulse and BP over the next 3 days. She thanked me for my help.   YOUR CARDIOLOGY TEAM HAS ARRANGED FOR AN E-VISIT FOR YOUR APPOINTMENT - PLEASE REVIEW IMPORTANT INFORMATION BELOW SEVERAL DAYS PRIOR TO YOUR APPOINTMENT  Due to the recent COVID-19 pandemic, we are transitioning in-person office visits to tele-medicine visits in an effort to decrease unnecessary exposure to our patients, their families, and staff. These visits are billed to your insurance just like a normal visit is. We also encourage you to sign up for MyChart if you have not already done so. You will need a smartphone if possible. For patients that do not have this, we can still complete the visit using a regular telephone but do prefer a smartphone to enable video when possible. You may have a family member that lives with you that can help. If possible, we also ask that you have a blood pressure cuff and scale at home to measure your blood pressure, heart rate and weight prior to your scheduled appointment. Patients with clinical needs that need an in-person evaluation and testing will still be able to come to the office if absolutely necessary. If you have any questions, feel free to call our office.     YOUR PROVIDER WILL BE USING THE FOLLOWING PLATFORM TO COMPLETE YOUR VISIT:Doxy.Me   . IF USING DOXIMITY or DOXY.ME - The staff will give you instructions on receiving your link to join the meeting the day of your visit.    THE DAY OF YOUR APPOINTMENT  Approximately 15 minutes prior to your scheduled appointment, you will receive a telephone call from one of [facility] team - your caller ID may say "Unknown caller."  Our staff will confirm medications, vital signs for the day and any symptoms you may be experiencing. Please have this information available prior to the time of visit start. It may also  be helpful for you to have a pad of paper and pen handy for any instructions given during your visit. They will also walk you through joining the smartphone meeting if this is a video visit.   CONSENT FOR TELE-HEALTH VISIT - PLEASE REVIEW  I hereby voluntarily request, consent and authorize [facility] and its employed or contracted physicians, physician assistants, nurse practitioners or other licensed health care professionals (the Practitioner), to provide me with telemedicine health care services (the "Services") as deemed necessary by the treating Practitioner. I acknowledge and consent to receive the Services by the Practitioner via telemedicine. I understand that the telemedicine visit will involve communicating with the Practitioner through live audiovisual communication technology and the disclosure of certain medical information by electronic transmission. I acknowledge that I have been given the opportunity to request an in-person assessment or other available alternative prior to the telemedicine visit and am voluntarily participating in the telemedicine visit.  I understand that I have the right to withhold or withdraw my consent to the use of telemedicine in the course of my care at any time, without affecting my right to future care or treatment, and that the Practitioner or I may terminate the telemedicine visit at any time. I understand that I have the right to inspect all information obtained and/or recorded in the course of the telemedicine visit and may receive copies of available information for a reasonable fee.  I understand that some of the potential risks of receiving  the Services via telemedicine include:  Marland Kitchen Delay or interruption in medical evaluation due to technological equipment failure or disruption; . Information transmitted may not be sufficient (e.g. poor resolution of images) to allow for appropriate medical decision making by the Practitioner; and/or  . In rare  instances, security protocols could fail, causing a breach of personal health information.  Furthermore, I acknowledge that it is my responsibility to provide information about my medical history, conditions and care that is complete and accurate to the best of my ability. I acknowledge that Practitioner's advice, recommendations, and/or decision may be based on factors not within their control, such as incomplete or inaccurate data provided by me or distortions of diagnostic images or specimens that may result from electronic transmissions. I understand that the practice of medicine is not an exact science and that Practitioner makes no warranties or guarantees regarding treatment outcomes. I acknowledge that I will receive a copy of this consent concurrently upon execution via email to the email address I last provided but may also request a printed copy by calling the office of [facility].    I understand that my insurance will be billed for this visit.   I have read or had this consent read to me. . I understand the contents of this consent, which adequately explains the benefits and risks of the Services being provided via telemedicine.  . I have been provided ample opportunity to ask questions regarding this consent and the Services and have had my questions answered to my satisfaction. . I give my informed consent for the services to be provided through the use of telemedicine in my medical care  By participating in this telemedicine visit I agree to the above.

## 2020-01-11 ENCOUNTER — Encounter: Payer: Self-pay | Admitting: Primary Care

## 2020-01-11 ENCOUNTER — Other Ambulatory Visit: Payer: Self-pay

## 2020-01-11 ENCOUNTER — Telehealth: Payer: Self-pay | Admitting: Emergency Medicine

## 2020-01-11 ENCOUNTER — Ambulatory Visit (INDEPENDENT_AMBULATORY_CARE_PROVIDER_SITE_OTHER): Payer: Medicare HMO | Admitting: Primary Care

## 2020-01-11 DIAGNOSIS — J449 Chronic obstructive pulmonary disease, unspecified: Secondary | ICD-10-CM | POA: Diagnosis not present

## 2020-01-11 MED ORDER — YUPELRI 175 MCG/3ML IN SOLN: 1.0000 | Freq: Every day | RESPIRATORY_TRACT | 3 refills | Status: DC

## 2020-01-11 MED ORDER — YUPELRI 175 MCG/3ML IN SOLN: 1.0000 | Freq: Every day | RESPIRATORY_TRACT | 0 refills | Status: DC

## 2020-01-11 NOTE — Telephone Encounter (Signed)
Called and spoke to daughter, Katharine Look. Katharine Look then called pt. Pt states since he has come off the Yupelri his breathing has worsened. Pt stopped the Yupelri around 3 weeks ago and was started on Spiriva. Pt states he can tell a significant difference in his breathing, pt c/o increase in SOB, wheezing, and cough with very thick mucus. Pt's daughter and son in law have tested positive for covid recently and were around the pt last week. Katharine Look is also worried pt may have covid. Pt met with pharmacy on 12/21 to discuss the Yupelri options. We currently have Yupelri samples but pt was changed to Spiriva. Telephone visit has been scheduled with Beth today at 1130 to discuss s/s and medication. Pt aware to seek emergency care if s/s were to worsen. Nothing further needed at this time.   Will forward to [facility] as FYI.

## 2020-01-11 NOTE — Progress Notes (Signed)
Virtual Visit via Video Note   This visit type was conducted due to national recommendations for restrictions regarding the COVID-19 Pandemic (e.g. social distancing) in an effort to limit this patient's exposure and mitigate transmission in our community.  Due to his co-morbid illnesses, this patient is at least at moderate risk for complications without adequate follow up.  This format is felt to be most appropriate for this patient at this time.  All issues noted in this document were discussed and addressed.  A limited physical exam was performed with this format.  Please refer to the patient's chart for his consent to telehealth for [facility].   Date:  01/12/2020   ID:  Tristan Le, DOB 11/08/1941, MRN [id number]  Patient Location: Home Provider Location: Office  PCP:  Tristan Le, [place]  Cardiologist:  Jaden Batchelder  Electrophysiologist:  None   Evaluation Performed:  Follow-Up Visit  Chief Complaint:  Dyspnea   History of Present Illness:    Tristan Le is a 79 y.o. male who I originally saw in October, 2020 for shortness of breath.  His pulmonary function tests were normal.  His hemoglobin was 17. His echocardiogram revealed normal left ventricular systolic function and grade 1 diastolic dysfunction. [place] study revealed no evidence of ischemia and normal left ventricular systolic function.  The study was unchanged compared to his previous Myoview study in 2011.  Is overall doing well Is still short of breath  Does have COPD .   Has hyperlipidemia.   Is now on Pralulent   Coronary calcium score is 1612 ( 84th % age / gender controls )   Has generalized weakness in his legs    The patient does not have symptoms concerning for COVID-19 infection (fever, chills, cough, or new shortness of breath).    Past Medical History:  Diagnosis Date  . Arthritis   . Chest pain   . COPD (chronic obstructive pulmonary disease) ([place]) 05/18/2019  . Cough  05/18/2019   06/13/2019-AFB-negative Fungal culture-showing yeast Respiratory sputum culture-negative   . GERD (gastroesophageal reflux disease)   . Hyperlipidemia   . Hypertension    No past surgical history on file.   Current Meds  Medication Sig  . albuterol (VENTOLIN HFA) 108 (90 Base) MCG/ACT inhaler Inhale 1-2 puffs into the lungs daily as needed for shortness of breath.  . Alirocumab (PRALUENT) 150 MG/ML SOAJ Inject 1 pen into the skin every 14 (fourteen) days.  . budesonide (PULMICORT) 0.5 MG/2ML nebulizer solution Take 2 mLs (0.5 mg total) by nebulization 2 (two) times daily.  . diphenhydrAMINE (BENADRYL) 25 MG tablet Take 25 mg by mouth daily.  . hydrochlorothiazide (MICROZIDE) 12.5 MG capsule Take 1 capsule (12.5 mg total) by mouth daily.  Vanessa Kick Ethyl (VASCEPA) 0.5 g CAPS Take 4 capsules by mouth 2 (two) times daily.  Marland Kitchen ipratropium-albuterol (DUONEB) 0.5-2.5 (3) MG/3ML SOLN Take 3 mLs by nebulization every 12 (twelve) hours. As needed for wheezing  . Omega-3 Fatty Acids (FISH OIL) 1000 MG CAPS Take 1 g by mouth 2 (two) times daily. Take 2 capsules, 2 times daily  . predniSONE (DELTASONE) 10 MG tablet TAKE 1/2 (ONE-HALF) TABLET BY MOUTH ONCE DAILY WITH BREAKFAST  . Respiratory Therapy Supplies (FLUTTER) DEVI Use as directed  . Revefenacin (YUPELRI) 175 MCG/3ML SOLN Inhale 1 ampule into the lungs daily.  . [DISCONTINUED] irbesartan (AVAPRO) 75 MG tablet Take 1 tablet by mouth once daily   Current Facility-Administered Medications for the 01/12/20 encounter (Telemedicine) with  My Rinke, Deloris Ping, MD  Medication  . 0.9 %  sodium chloride infusion     Allergies:   Penicillins and Sulfonamide derivatives   Social History   Tobacco Use  . Smoking status: Former Smoker    Packs/day: 0.50    Years: 60.00    Pack years: 30.00    Types: Cigarettes    Quit date: 04/27/2019    Years since quitting: 0.7  . Smokeless tobacco: Never Used  Substance Use Topics  . Alcohol use: No   . Drug use: No     Family Hx: The patient's family history includes Heart disease in his brother and father; Hyperlipidemia in his brother.  ROS:   Please see the history of present illness.     All other systems reviewed and are negative.   Prior CV studies:   The following studies were reviewed today:    Labs/Other Tests and Data Reviewed:    EKG:  An ECG dated  Oct. , 2020 was personally reviewed today and demonstrated:  NSR with NSR in 90s .   Recent Labs: 10/06/2019: BUN 18; Creatinine, Ser 1.10; Hemoglobin 15.5; Platelets 212; Potassium 4.2; Sodium 137; TSH 1.770 12/07/2019: ALT 32   Recent Lipid Panel Lab Results  Component Value Date/Time   CHOL 149 12/07/2019 07:59 AM   TRIG 172 (H) 12/07/2019 07:59 AM   HDL 43 12/07/2019 07:59 AM   CHOLHDL 3.5 12/07/2019 07:59 AM   CHOLHDL 6.0 08/10/2010 01:57 AM   LDLCALC 77 12/07/2019 07:59 AM   LDLDIRECT 83 12/07/2019 07:59 AM    Wt Readings from Last 3 Encounters:  01/12/20 234 lb (106.1 kg)  10/11/19 233 lb (105.7 kg)  10/06/19 233 lb 12.8 oz (106.1 kg)     Objective:    Vital Signs:  BP 107/72   Pulse (!) 101   Ht 6\' 2"  (1.88 m)   Wt 234 lb (106.1 kg)   BMI 30.04 kg/m    VITAL SIGNS:  reviewed GEN:  no acute distress EYES:  sclerae anicteric, EOMI - Extraocular Movements Intact RESPIRATORY:  normal respiratory effort, symmetric expansion CARDIOVASCULAR:  no peripheral edema SKIN:  no rash, lesions or ulcers. MUSCULOSKELETAL:  no obvious deformities. NEURO:  alert and oriented x 3, no obvious focal deficit PSYCH:  normal affect  ASSESSMENT & PLAN:    1. Shortness of breath: His LV function is normal by echo.  He has no ischemia by Myoview study.  Of note is that he remains borderline tachycardic and with any sort of exertion his heart rate is in the 110 range.  I think he will do better with a slower heart rate.  We will start him on diltiazem CD 240 mg a day.  We will stop his Avapro.  We will have him  return to see an APP in 3 months for follow-up visit.  COVID-19 Education: The signs and symptoms of COVID-19 were discussed with the patient and how to seek care for testing (follow up with PCP or arrange E-visit).  The importance of social distancing was discussed today.  Time:   Today, I have spent  18  minutes with the patient with telehealth technology discussing the above problems.     Medication Adjustments/Labs and Tests Ordered: Current medicines are reviewed at length with the patient today.  Concerns regarding medicines are outlined above.   Tests Ordered: No orders of the defined types were placed in this encounter.   Medication Changes: Meds ordered this encounter  Medications  .  diltiazem (DILTIAZEM CD) 240 MG 24 hr capsule    Sig: Take 1 capsule (240 mg total) by mouth daily.    Dispense:  30 capsule    Refill:  11    Follow Up:  In Person in 3 month(s)  Signed, Kristeen Miss, MD  01/12/2020 4:06 PM     [facility]

## 2020-01-11 NOTE — Patient Instructions (Signed)
  Stop Spiriva Continue Pulmicort nebulizer twice daily Resume Yupelri nebulizer once daily (samples give and RX sent to [facility]) Use Duoneb (ipratropium/albuterol) nebulizer 2-4 times a day   Follow-up: In February with Dr. Delton Coombes

## 2020-01-11 NOTE — Progress Notes (Signed)
Virtual Visit via Video Note  I connected with Tristan Le on 01/11/20 at 11:30 AM EST by a video enabled telemedicine application and verified that I am speaking with the correct person using two identifiers.  Location: Patient: Home Provider: Office   I discussed the limitations of evaluation and management by telemedicine and the availability of in person appointments. The patient expressed understanding and agreed to proceed.  History of Present Illness: 79 year old male, former smoker. PMH significant for COPD. Patient of Dr. Lamonte Sakai, last seen on 11/29/19. Reports significant benefit from [place], he has not done as well on Duonebs or any other alternative.  Needs prior authorization for Yupelri d/t cost, receiving samples. Maintained on prednisone 72m daily.   01/11/2020 Contacted today for televisit. Reports increased shortness of breath over the last 2-3 weeks.  Occasional cough with clear/white mucus. Met with pharmacist on 12/19/19 for medication management. YMaretta Beeswas changed to Spiriva d/t cost in last December. Feels spiriva is not working. Using Pulmicort twice daily as prescribed. Continue prednisone 581mdaily. He forgets to use Duoneb nebulizer. YuMaretta Beess the only medication that appears to work well for him. YuMaretta Beess $200 copay (samples). [facility]  Observations/Objective:  - 93-97% O2 RA   Assessment and Plan:  COPD - Increased shortness of breath since LAMA was changed to Spiriva d/t cost - Resume Yupelri once daily (samples given and [facility]) - Continue Pulmicort twice daily; duoneb q 6 hours prn   Follow Up Instructions:  - Due for follow-up beginning of Feb    I discussed the assessment and treatment plan with the patient. The patient was provided an opportunity to ask questions and all were answered. The patient agreed with the plan and demonstrated an understanding of the instructions.    The patient was advised to call back or seek an in-person evaluation if the symptoms worsen or if the condition fails to improve as anticipated.  I provided 18 minutes of non-face-to-face time during this encounter.   ElMartyn EhrichNP

## 2020-01-12 ENCOUNTER — Encounter: Payer: Self-pay | Admitting: Cardiovascular Disease

## 2020-01-12 ENCOUNTER — Ambulatory Visit: Payer: Medicare HMO | Attending: Internal Medicine

## 2020-01-12 ENCOUNTER — Telehealth (INDEPENDENT_AMBULATORY_CARE_PROVIDER_SITE_OTHER): Payer: Medicare HMO | Admitting: Cardiovascular Disease

## 2020-01-12 ENCOUNTER — Other Ambulatory Visit: Payer: Self-pay

## 2020-01-12 DIAGNOSIS — R0600 Dyspnea, unspecified: Secondary | ICD-10-CM | POA: Diagnosis not present

## 2020-01-12 DIAGNOSIS — Z20822 Contact with and (suspected) exposure to covid-19: Secondary | ICD-10-CM

## 2020-01-12 DIAGNOSIS — R0609 Other forms of dyspnea: Secondary | ICD-10-CM | POA: Insufficient documentation

## 2020-01-12 MED ORDER — DILTIAZEM HCL ER COATED BEADS 240 MG PO CP24: 240.0000 mg | ORAL_CAPSULE | Freq: Every day | ORAL | 11 refills | Status: DC

## 2020-01-12 NOTE — Patient Instructions (Addendum)
Medication Instructions:  Your physician has recommended you make the following change in your medication:  STOP Avapro (Irbesartan) START Diltiazem (Cardizem) 240 mg once daily  *If you need a refill on your cardiac medications before your next appointment, please call your pharmacy*  Lab Work: None Ordered If you have labs (blood work) drawn today and your tests are completely normal, you will receive your results only by: Marland Kitchen MyChart Message (if you have MyChart) OR . A paper copy in the mail If you have any lab test that is abnormal or we need to change your treatment, we will call you to review the results.   Testing/Procedures: None Ordered    Follow-Up: At [facility], you and your health needs are our priority.  As part of our continuing mission to provide you with exceptional heart care, we have created designated Provider Care Teams.  These Care Teams include your primary Cardiologist (physician) and Advanced Practice Providers (APPs -  Physician Assistants and Nurse Practitioners) who all work together to provide you with the care you need, when you need it.  Your next appointment:   3 month(s) on Friday April 16 at 1:45 pm  The format for your next appointment:   In Person  Provider:   Chelsea Aus, [place]

## 2020-01-13 LAB — NOVEL CORONAVIRUS, NAA: SARS-CoV-2, NAA: NOT DETECTED

## 2020-02-01 ENCOUNTER — Telehealth: Payer: Self-pay | Admitting: Emergency Medicine

## 2020-02-01 DIAGNOSIS — J441 Chronic obstructive pulmonary disease with (acute) exacerbation: Secondary | ICD-10-CM

## 2020-02-01 MED ORDER — DOXYCYCLINE HYCLATE 100 MG PO TABS: 100.0000 mg | ORAL_TABLET | Freq: Two times a day (BID) | ORAL | 0 refills | Status: DC

## 2020-02-01 MED ORDER — PREDNISONE 10 MG PO TABS: ORAL_TABLET | ORAL | 0 refills | Status: DC

## 2020-02-01 NOTE — Telephone Encounter (Signed)
Called the patient and made him aware of the response received from Dr. Delton Coombes. Prescriptions sent to [facility]. Patient scheduled for follow up with Dr. Delton Coombes on 02/16/20. Patient advised that if his situation worsens while taking the medication (prior to the scheduled appt) to call the office so he can be seen sooner. Patient voiced understanding. Nothing further needed at this time.

## 2020-02-01 NOTE — Telephone Encounter (Signed)
Spoke with the pt  He states having a lot of congestion in his chest  He feels that he has been coughing less over the past 2 days, but despite this he has pulled a pectoral muscle while coughing  He states that when he does cough he tends to bring up white sputum, but occ he sees some green  He has not been having and increased SOB but if he stays in the same position for a while, he notices some wheezing  He denies any body aches, chills, fevers  He is taking mucinex liquid 6 oz per day and rarely uses his flutter valve bc he states does not help  Please advise thanks!

## 2020-02-01 NOTE — Telephone Encounter (Signed)
I am OK treating him for an acute flare of COPD as below.   He needs an OV with me or APP in about 2 weeks to assess progress, clarify his medical regimen.   Prednisone: Take 40mg  daily for 3 days, then 30mg  daily for 3 days, then 20mg  daily for 3 days, then 10mg  daily for 3 days, then stop Doxycycline 100mg  bid x 7 days

## 2020-02-16 ENCOUNTER — Ambulatory Visit: Payer: Medicare HMO | Admitting: Emergency Medicine

## 2020-02-16 ENCOUNTER — Telehealth (INDEPENDENT_AMBULATORY_CARE_PROVIDER_SITE_OTHER): Payer: Medicare HMO | Admitting: Emergency Medicine

## 2020-02-16 ENCOUNTER — Encounter: Payer: Self-pay | Admitting: Emergency Medicine

## 2020-02-16 DIAGNOSIS — J410 Simple chronic bronchitis: Secondary | ICD-10-CM

## 2020-02-16 NOTE — Progress Notes (Signed)
Virtual Visit via Video Note  I connected with Tristan Le on 02/16/20 at  1:45 PM EST by a video enabled telemedicine application and verified that I am speaking with the correct person using two identifiers.  Location: Patient: Home Provider: Home Office   I discussed the limitations of evaluation and management by telemedicine and the availability of in person appointments. The patient expressed understanding and agreed to proceed.  History of Present Illness: 79 year old man and combined restriction and obstruction by pulmonary function testing (FEV1 normal). He has a low risk Myoview study from 10/11/2019.  Despite his near normal PFT he got significant improvement in dyspnea on Yupelri.  It had to be temporarily changed back to Spiriva due to cost.  He did not benefit so Mikael Spray was restarted - remains costly.  He is on chronic prednisone 5 mg daily, pulmicort bid. Uses duoneb bid. Occasional albuterol, about 1x a day. He has been on the prednisone for over 2 months.    Observations/Objective: I treated him for suspected acute exacerbation of COPD 2 weeks ago with doxycycline and prednisone.  He reports that he benefited significantly from the doxycycline - his breathing improved, his cough and mucous production improved. He is not having a bit more phlegm, white to green. Overall back close to baseline He is interested in getting the COVID vaccine, but he has not been able to do yet, needs info  COVID-19 Vaccine Information can be found at: PodExchange.nl For questions related to vaccine distribution or appointments, please email [email or web address] or call [phone].   Assessment and Plan: COPD with a chronic bronchitis phenotype.  -Continue current bronchodilator regimen including Yupelri -Would like to try to wean his prednisone to off if possible.  Has been on 5 mg for over 2 months.  Decreased to 2.5 mg daily  for the next week, then go to 0. -Discussed COVID-19 vaccine information with him today.  He is interested in getting this and will provide him with the [facility] health contact information as listed above.  Follow Up Instructions: 1 month   I discussed the assessment and treatment plan with the patient. The patient was provided an opportunity to ask questions and all were answered. The patient agreed with the plan and demonstrated an understanding of the instructions.   The patient was advised to call back or seek an in-person evaluation if the symptoms worsen or if the condition fails to improve as anticipated.  I provided 17 minutes of non-face-to-face time during this encounter.   Leslye Peer, MD

## 2020-02-24 ENCOUNTER — Ambulatory Visit: Payer: Medicare HMO

## 2020-02-24 ENCOUNTER — Ambulatory Visit: Payer: Medicare HMO | Attending: Internal Medicine

## 2020-02-24 DIAGNOSIS — Z23 Encounter for immunization: Secondary | ICD-10-CM

## 2020-02-24 NOTE — Progress Notes (Signed)
   Covid-19 Vaccination Clinic  Name:  Tristan Le    MRN: [id number] DOB: [birth year]  02/24/2020  Mr. Jurewicz was observed post Covid-19 immunization for 15 minutes without incidence. He was provided with Vaccine Information Sheet and instruction to access the V-Safe system.   Mr. Knightly was instructed to call 911 with any severe reactions post vaccine: Marland Kitchen Difficulty breathing  . Swelling of your face and throat  . A fast heartbeat  . A bad rash all over your body  . Dizziness and weakness    Immunizations Administered    Name Date Dose VIS Date Route   Pfizer COVID-19 Vaccine 02/24/2020 11:20 AM 0.3 mL 12/09/2019 Intramuscular   Manufacturer: [employer], [employer]   Lot: [id number]   NDC: [address]-6

## 2020-03-02 ENCOUNTER — Telehealth: Payer: Self-pay | Admitting: Emergency Medicine

## 2020-03-02 MED ORDER — PREDNISONE 10 MG PO TABS: ORAL_TABLET | ORAL | 0 refills | Status: DC

## 2020-03-02 MED ORDER — PREDNISONE 2.5 MG PO TABS: 2.5000 mg | ORAL_TABLET | Freq: Every day | ORAL | 0 refills | Status: DC

## 2020-03-02 MED ORDER — DOXYCYCLINE HYCLATE 100 MG PO TABS: 100.0000 mg | ORAL_TABLET | Freq: Two times a day (BID) | ORAL | 0 refills | Status: DC

## 2020-03-02 NOTE — Telephone Encounter (Signed)
How much is he on right now? On what dose of prednisone did his symptoms worsen?  If he is currently on 0mg ; then lets resume 2.5mg  daily until follow-up.  If he is currently on 2.5mg ; then lets alternate 5mg  with 2.5mg  every other day until next visit  Can send in doxycycline and prednisone 20mg  x 5 days

## 2020-03-02 NOTE — Telephone Encounter (Signed)
Spoke with daughter, she states pt was not on prednisone at all. I gave her message from [place] and she agreed. He didn't have any 2.5 mg tablets so I sent another Rx for prednisone 2.5. Nothing further is needed.

## 2020-03-02 NOTE — Telephone Encounter (Signed)
Spoke with daughter, she states after he tapered off prednisone he is having symptoms of cough, SOB, and wheezing. She thinks he needs another abx and prednisone taper. She is requesting another Rx for prednisone after the taper is done for maintenance dose. The pt and Dr. Delton Coombes spoke about this but pt felt like he didn't need it. Dr. Delton Coombes is not here so I will send this to [facility] since she saw pt in the past. Please advise.       [facility]  Observations/Objective: I treated him for suspected acute exacerbation of COPD 2 weeks ago with doxycycline and prednisone.  He reports that he benefited significantly from the doxycycline - his breathing improved, his cough and mucous production improved. He is not having a bit more phlegm, white to green. Overall back close to baseline He is interested in getting the COVID vaccine, but he has not been able to do yet, needs info  COVID-19 Vaccine Information can be found at: PodExchange.nl For questions related to vaccine distribution or appointments, please [email or web address] or [phone].   Assessment and Plan: COPD with a chronic bronchitis phenotype.  -Continue current bronchodilator regimen including Yupelri -Would like to try to wean his prednisone to off if possible.  Has been on 5 mg for over 2 months.  Decreased to 2.5 mg daily for the next week, then go to 0. -Discussed COVID-19 vaccine information with him today.  He is interested in getting this and will provide him with the [facility] health contact information as listed above.  Follow Up Instructions: 1 month  I discussed the assessment and treatment plan with the patient. The patient was provided an opportunity to ask questions and all were answered. The patient agreed with the plan and demonstrated an understanding of the instructions.  The patient was advised to call back or seek an in-person  evaluation if the symptoms worsen or if the condition fails to improve as anticipated.  I provided 17 minutes of non-face-to-face time during this encounter.   Leslye Peer, MD

## 2020-03-19 ENCOUNTER — Other Ambulatory Visit: Payer: Self-pay

## 2020-03-19 ENCOUNTER — Ambulatory Visit: Payer: Medicare HMO | Admitting: Emergency Medicine

## 2020-03-19 ENCOUNTER — Encounter: Payer: Self-pay | Admitting: Emergency Medicine

## 2020-03-19 VITALS — BP 126/70 | HR 87 | Temp 97.2°F | Ht 73.0 in | Wt 231.2 lb

## 2020-03-19 DIAGNOSIS — J441 Chronic obstructive pulmonary disease with (acute) exacerbation: Secondary | ICD-10-CM

## 2020-03-19 DIAGNOSIS — J410 Simple chronic bronchitis: Secondary | ICD-10-CM

## 2020-03-19 NOTE — Progress Notes (Signed)
Subjective:    Patient ID: Tristan Le, male    DOB: 1941-07-18, 79 y.o.   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  He was referred today for initial evaluation of suspected COPD.  PFT revealed mixed restriction and obstruction with a normal FEV1.  He has been requiring chronic prednisone, most recently managed with Yupelri and Pulmicort nebs twice daily.  At his last visit 1 month ago I asked him to try to wean his prednisone.  He weaned to 2.5mg  daily, never weaned to off. He was treated in the interim with a course of doxycycline and 20mg  pred x 5 days for increased phlegm, difficulty clearing secretions. He also started using mucinex more reliably. Difficult to say which intervention helped him the most. Using DuoNeb bid, uses albuterol HFA rarely. Close to his exertional baseline, minimal wheeze. Secretions much better.  His nebulizer machine is not working properly, sometimes takes him 45 minutes to deliver his medication.     Review of Systems  Constitutional: Negative for fever and unexpected weight change.  HENT: Negative for congestion, dental problem, ear pain, nosebleeds, postnasal drip, rhinorrhea, sinus pressure, sneezing, sore throat and trouble swallowing.   Eyes: Negative for redness and itching.  Respiratory: Positive for cough, shortness of breath and wheezing. Negative for chest tightness.   Cardiovascular: Positive for chest pain. Negative for palpitations and leg swelling.  Gastrointestinal: Negative for nausea and vomiting.  Genitourinary: Negative for dysuria.  Musculoskeletal: Negative for joint swelling.  Skin: Negative for rash.  Neurological: Negative for headaches.  Hematological: Does not bruise/bleed easily.  Psychiatric/Behavioral: Negative for dysphoric mood. The patient is not nervous/anxious.     Past Medical  History:  Diagnosis Date  . Arthritis   . Chest pain   . COPD (chronic obstructive pulmonary disease) (HCC) 05/18/2019  . Cough 05/18/2019   06/13/2019-AFB-negative Fungal culture-showing yeast Respiratory sputum culture-negative   . GERD (gastroesophageal reflux disease)   . Hyperlipidemia   . Hypertension      Family History  Problem Relation Age of Onset  . Heart disease Father   . Heart disease Brother   . Hyperlipidemia Brother      Social History   Socioeconomic History  . Marital status: Married    Spouse name: Not on file  . Number of children: 4  . Years of education: Not on file  . Highest education level: Not on file  Occupational History  . Occupation: Retired  Tobacco Use  . Smoking status: Former Smoker    Packs/day: 0.50    Years: 60.00    Pack years: 30.00    Types: Cigarettes    Quit date: 04/27/2019    Years since quitting: 0.8  . Smokeless tobacco: Never Used  Substance and Sexual Activity  . Alcohol use: No  . Drug use: No  . Sexual activity: Not on file  Other Topics Concern  . Not on file  Social History Narrative  . Not on file   Social Determinants of Health   Financial Resource Strain:   . Difficulty of Paying Living Expenses:   Food Insecurity:   . Worried About 04/29/2019 in the Last Year:   . Programme researcher, broadcasting/film/video in the Last Year:   Transportation Needs:   . Barista (Medical):   Freight forwarder Lack of Transportation (Non-Medical):   Physical  Activity:   . Days of Exercise per Week:   . Minutes of Exercise per Session:   Stress:   . Feeling of Stress :   Social Connections:   . Frequency of Communication with Friends and Family:   . Frequency of Social Gatherings with Friends and Family:   . Attends Religious Services:   . Active Member of Clubs or Organizations:   . Attends Banker Meetings:   Marland Kitchen Marital Status:   Intimate Partner Violence:   . Fear of Current or Ex-Partner:   . Emotionally Abused:   Marland Kitchen  Physically Abused:   . Sexually Abused:   has had many jobs - Psychologist, forensic, has been exposed to wood dust, occasionally wore a mask No military  Has lived in [place], [place], [place], [place]  Allergies  Allergen Reactions  . Penicillins     REACTION: Upset stomach,mouth ulcers,rash  . Sulfonamide Derivatives     REACTION: Rash with huge blisters     Outpatient Medications Prior to Visit  Medication Sig Dispense Refill  . albuterol (VENTOLIN HFA) 108 (90 Base) MCG/ACT inhaler Inhale 1-2 puffs into the lungs daily as needed for shortness of breath. 18 g 3  . Alirocumab (PRALUENT) 150 MG/ML SOAJ Inject 1 pen into the skin every 14 (fourteen) days. 2 pen 11  . diltiazem (DILTIAZEM CD) 240 MG 24 hr capsule Take 1 capsule (240 mg total) by mouth daily. 30 capsule 11  . diphenhydrAMINE (BENADRYL) 25 MG tablet Take 25 mg by mouth daily.    . hydrochlorothiazide (MICROZIDE) 12.5 MG capsule Take 1 capsule (12.5 mg total) by mouth daily. 30 capsule 0  . Icosapent Ethyl (VASCEPA) 0.5 g CAPS Take 4 capsules by mouth 2 (two) times daily. 720 capsule 3  . ipratropium-albuterol (DUONEB) 0.5-2.5 (3) MG/3ML SOLN Take 3 mLs by nebulization every 12 (twelve) hours. As needed for wheezing    . Omega-3 Fatty Acids (FISH OIL) 1000 MG CAPS Take 1 g by mouth 2 (two) times daily. Take 2 capsules, 2 times daily    . predniSONE (DELTASONE) 2.5 MG tablet Take 1 tablet (2.5 mg total) by mouth daily with breakfast. 30 tablet 0  . Respiratory Therapy Supplies (FLUTTER) DEVI Use as directed 1 each 0  . Revefenacin (YUPELRI) 175 MCG/3ML SOLN Inhale 1 ampule into the lungs daily. 90 mL 0  . budesonide (PULMICORT) 0.5 MG/2ML nebulizer solution Take 2 mLs (0.5 mg total) by nebulization 2 (two) times daily. 360 mL 1  . doxycycline (VIBRA-TABS) 100 MG tablet Take 1 tablet (100 mg total) by mouth 2 (two) times daily. 14 tablet 0  . predniSONE (DELTASONE) 10 MG tablet TAKE 1/2 (ONE-HALF) TABLET BY MOUTH ONCE DAILY WITH BREAKFAST 15 tablet  2  . predniSONE (DELTASONE) 10 MG tablet Take 2 tabs x 5 days 10 tablet 0   Facility-Administered Medications Prior to Visit  Medication Dose Route Frequency Provider Last Rate Last Admin  . 0.9 %  sodium chloride infusion  500 mL Intravenous Once Napoleon Form, MD            Objective:   Physical Exam Vitals:   03/19/20 1330  BP: 126/70  Pulse: 87  Temp: (!) 97.2 F (36.2 C)  TempSrc: Temporal  SpO2: 95%  Weight: 231 lb 3.2 oz (104.9 kg)  Height: 6\' 1"  (1.854 m)   Gen: Pleasant, well-nourished, in no distress,  normal affect  ENT: No lesions,  mouth clear, edentulous, oropharynx clear, no postnasal drip  Neck:  No JVD, no stridor  Lungs: No use of accessory muscles, no crackles or wheezing on normal respiration, no wheezing  Cardiovascular: RRR, heart sounds normal, no murmur or gallops, no peripheral edema  Musculoskeletal: No deformities, no cyanosis or clubbing  Neuro: alert, awake, non focal  Skin: Warm, no lesions or rash     Assessment & Plan:  COPD (chronic obstructive pulmonary disease) ([place]) He believes he probably tolerated the decrease in prednisone although difficult to say because he had increased secretions, required doxycycline and then a prednisone burst.  He is now on 2.5 mg prednisone daily, is not clear whether he ever was weaned completely off or not.  He believes that the doxycycline helped in the most and this may be correct.  I think it is still a good goal to try and get the prednisone to 0 and we will work on this now.  If he tolerates or if his secretion burden increases then it may be reasonable to change him over to cyclical monthly antibiotics, doxycycline, azithromycin, cefuroxime.  He needs a new nebulizer machine and I will work on this.  Follow-up in 1 month  We will try to wean your prednisone to off. Depending on your symptoms we may decide to initiate rotating cyclical antibiotics to see if this controls your cough, mucus  production. Continue Yupelri once daily Continue Pulmicort and DuoNeb twice a day We will work on getting you a new nebulizer machine and delivery system from [employer] your albuterol available use 2 puffs if needed for shortness of breath, chest tightness, wheezing. Follow with Dr Lamonte Sakai in 1 month  Baltazar Apo, MD, PhD 03/19/2020, 1:48 PM [facility] [phone] or if no answer [phone]

## 2020-03-19 NOTE — Patient Instructions (Addendum)
We will try to wean your prednisone to off. Depending on your symptoms we may decide to initiate rotating cyclical antibiotics to see if this controls your cough, mucus production. Continue Yupelri once daily Continue Pulmicort and DuoNeb twice a day We will work on getting you a new nebulizer machine and delivery system from [employer] your albuterol available use 2 puffs if needed for shortness of breath, chest tightness, wheezing. Follow with Dr Delton Coombes in 1 month

## 2020-03-19 NOTE — Assessment & Plan Note (Signed)
He believes he probably tolerated the decrease in prednisone although difficult to say because he had increased secretions, required doxycycline and then a prednisone burst.  He is now on 2.5 mg prednisone daily, is not clear whether he ever was weaned completely off or not.  He believes that the doxycycline helped in the most and this may be correct.  I think it is still a good goal to try and get the prednisone to 0 and we will work on this now.  If he tolerates or if his secretion burden increases then it may be reasonable to change him over to cyclical monthly antibiotics, doxycycline, azithromycin, cefuroxime.  He needs a new nebulizer machine and I will work on this.  Follow-up in 1 month  We will try to wean your prednisone to off. Depending on your symptoms we may decide to initiate rotating cyclical antibiotics to see if this controls your cough, mucus production. Continue Yupelri once daily Continue Pulmicort and DuoNeb twice a day We will work on getting you a new nebulizer machine and delivery system from [employer] your albuterol available use 2 puffs if needed for shortness of breath, chest tightness, wheezing. Follow with Dr Delton Coombes in 1 month

## 2020-03-19 NOTE — Addendum Note (Signed)
Addended by: Vianne Bulls R on: 03/19/2020 01:54 PM   Modules accepted: Orders

## 2020-03-20 ENCOUNTER — Ambulatory Visit: Payer: Medicare HMO | Attending: Internal Medicine

## 2020-03-20 DIAGNOSIS — Z23 Encounter for immunization: Secondary | ICD-10-CM

## 2020-03-20 NOTE — Progress Notes (Signed)
   Covid-19 Vaccination Clinic  Name:  Tristan Le    MRN: [id number] DOB: January 18, 1941  03/20/2020  [name] was observed post Covid-19 immunization for 30 minutes based on pre-vaccination screening without incident. He was provided with Vaccine Information Sheet and instruction to access the V-Safe system.   [name] was instructed to call 911 with any severe reactions post vaccine: Marland Kitchen Difficulty breathing  . Swelling of face and throat  . A fast heartbeat  . A bad rash all over body  . Dizziness and weakness   Immunizations Administered    Name Date Dose VIS Date Route   Pfizer COVID-19 Vaccine 03/20/2020  1:44 PM 0.3 mL 12/09/2019 Intramuscular   Manufacturer: [employer], [employer]   Lot: [id number]   NDC: [address]-8

## 2020-03-26 ENCOUNTER — Telehealth: Payer: Self-pay | Admitting: Emergency Medicine

## 2020-03-26 ENCOUNTER — Other Ambulatory Visit: Payer: Self-pay | Admitting: Pulmonary Disease

## 2020-03-26 DIAGNOSIS — J410 Simple chronic bronchitis: Secondary | ICD-10-CM

## 2020-03-26 NOTE — Telephone Encounter (Signed)
I called and spoke with the patient and made him aware of Brian's recommendations. He verbalized understanding.

## 2020-03-26 NOTE — Telephone Encounter (Signed)
03/26/2020  Patient was last seen in our office on 03/19/2020 by Dr. Delton Coombes.  Okay for patient to stop taking prednisone given symptoms and low prednisone dose.  He can contact our office back in 1 week and let us know how he is doing with the prednisone being tapered off completely.  I do not believe that the red itchy rash is directly from the prednisone given the length of time patient has been on prednisone the fact that the rash is recent.  I would recommend that he follows up with his primary care regarding the rash and further evaluation of this.  Patient can also follow-up with an urgent care.  Will route to RB as FYI.   Elisha Headland, FNP

## 2020-03-26 NOTE — Telephone Encounter (Signed)
I called and spoke with the pt.  He states he has a red, itchy rash from his neck to his waste that started about 10 days ago. When he scratched it, his skin starts to swell up- taking benadryl otc about 4 x per day- minimal relief.  He denies any increased SOB, trouble swallowing, or felling like his throat is swelling.  He states that he has not started on any new meds, tried any new foods, detergents, soaps.  He thinks that the pred is causing the rash- is taking 2.5 mg daily and has been on pred for months now, last ov note stated that RB planned to wean off but did not state a plan on how.  Please advise thanks!

## 2020-04-06 ENCOUNTER — Telehealth: Payer: Self-pay | Admitting: Emergency Medicine

## 2020-04-06 MED ORDER — DOXYCYCLINE HYCLATE 100 MG PO TABS: 100.0000 mg | ORAL_TABLET | Freq: Two times a day (BID) | ORAL | 0 refills | Status: DC

## 2020-04-06 NOTE — Telephone Encounter (Signed)
Please give him doxycycline 100mg  bid x 7 days. I'd like to see how much benefit he gets from this before going back to prednisone. We may decide to start him on cycled abx in the future depending on his response. Have him call next week to report how he is doing

## 2020-04-06 NOTE — Telephone Encounter (Signed)
Spoke with pt, he is requesting a Rx for prednisone. He is having a lot of SOB with exertion. He started getting worse 2-3 days ago and has been battling allergies since the weather has changed. He has to use albuterol inhaler more since then. He denies cough, fever, and chills. He is also requesting an ABX (Doxycycline) with the prednisone and he states it really helps his breathing. Dr. Delton Coombes please advise  [facility] on [place]

## 2020-04-06 NOTE — Telephone Encounter (Signed)
Called and spoke with Patient.  Dr. Kavin Leech recommendations given.  Understanding stated.  Patient aware to call next week with update for Dr. Delton Coombes.  Prescription sent to requested pharmacy.  Nothing further at this time.

## 2020-04-10 ENCOUNTER — Telehealth: Payer: Self-pay | Admitting: Emergency Medicine

## 2020-04-10 MED ORDER — PREDNISONE 5 MG PO TABS: ORAL_TABLET | ORAL | 0 refills | Status: DC

## 2020-04-10 NOTE — Telephone Encounter (Signed)
Called and spoke with pt's daughter Andrey Campanile letting her know the info stated by RB that he was fine with pt restarting prednisone. I stated to her the instructions on how pt would take the prednisone and have placed instructions on rx for pt. rx sent to preferred pharmacy. Nothing further needed.

## 2020-04-10 NOTE — Telephone Encounter (Signed)
Spoke with patient's daughter Andrey Campanile. She stated that RB called doxy 100mg  on 04/06/20 and was told to hold the prednisone while taking this. He has not been doing well with just the doxy. She and the patient has discussed going back on the prednisone, perhaps 10mg  once daily. His SOB has increased and he is now coughing more frequently. Denied any chest pain, fevers or body aches.   Pharmacy is 06/06/20 on [place].   RB, please advise. Thanks!

## 2020-04-10 NOTE — Telephone Encounter (Signed)
Ok with me for him to restart prednisone 10mg  daily for 5 days, then go to 5mg  daily and stay at that dose until we can follow up.

## 2020-04-13 ENCOUNTER — Encounter: Payer: Self-pay | Admitting: Physician Assistant

## 2020-04-13 ENCOUNTER — Ambulatory Visit: Payer: Medicare HMO | Admitting: Physician Assistant

## 2020-04-13 ENCOUNTER — Other Ambulatory Visit: Payer: Self-pay

## 2020-04-13 VITALS — BP 108/62 | HR 82 | Ht 73.0 in | Wt 223.5 lb

## 2020-04-13 DIAGNOSIS — J438 Other emphysema: Secondary | ICD-10-CM

## 2020-04-13 DIAGNOSIS — R931 Abnormal findings on diagnostic imaging of heart and coronary circulation: Secondary | ICD-10-CM

## 2020-04-13 DIAGNOSIS — R0609 Other forms of dyspnea: Secondary | ICD-10-CM

## 2020-04-13 DIAGNOSIS — Z789 Other specified health status: Secondary | ICD-10-CM

## 2020-04-13 DIAGNOSIS — E785 Hyperlipidemia, unspecified: Secondary | ICD-10-CM | POA: Diagnosis not present

## 2020-04-13 DIAGNOSIS — R0600 Dyspnea, unspecified: Secondary | ICD-10-CM | POA: Diagnosis not present

## 2020-04-13 NOTE — Patient Instructions (Signed)
Medication Instructions:  Your physician recommends that you continue on your current medications as directed. Please refer to the Current Medication list given to you today.  *If you need a refill on your cardiac medications before your next appointment, please call your pharmacy*   Lab Work: None ordered If you have labs (blood work) drawn today and your tests are completely normal, you will receive your results only by: Marland Kitchen MyChart Message (if you have MyChart) OR . A paper copy in the mail If you have any lab test that is abnormal or we need to change your treatment, we will call you to review the results.   Testing/Procedures: None ordered   Follow-Up: At [facility], you and your health needs are our priority.  As part of our continuing mission to provide you with exceptional heart care, we have created designated Provider Care Teams.  These Care Teams include your primary Cardiologist (physician) and Advanced Practice Providers (APPs -  Physician Assistants and Nurse Practitioners) who all work together to provide you with the care you need, when you need it.  We recommend signing up for the patient portal called "MyChart".  Sign up information is provided on this After Visit Summary.  MyChart is used to connect with patients for Virtual Visits (Telemedicine).  Patients are able to view lab/test results, encounter notes, upcoming appointments, etc.  Non-urgent messages can be sent to your provider as well.   To learn more about what you can do with MyChart, go to [email or web address].    Your next appointment:   6 month(s)  The format for your next appointment:   In Person  Provider:   You may see Dr. Elease Hashimoto  or one of the following Advanced Practice Providers on your designated Care Team:    Tereso Newcomer, PA-C  Vin [place], [place]  Berton Bon, NP    Other Instructions

## 2020-04-13 NOTE — Progress Notes (Signed)
Cardiology Office Note    Date:  04/13/2020   ID:  Tristan Le, DOB [birth year], MRN [id number]  PCP:  Alden Hipp, PA  Cardiologist: Dr. Elease Hashimoto  Chief Complaint: 3   Months follow up  History of Present Illness:   Tristan Le is a 79 y.o. male with hx of HTN, HLD and COPD with hx of 60 yr tobacco smoking (quit 2020) presents for follow up.   Establish care with Dr. Elease Hashimoto October 2020 for shortness of breath.  Pulmonary function test were normal.  Echocardiogram showed normal LV function with grade 1 diastolic dysfunction.  Normal stress test. Cardiac CT with coronary calcium score of 1612. This was 74 percentile for age and sex matched control.  Last seen by Dr. Elease Hashimoto January 2021.  Noted sinus tachycardia in 100s.  Stopped Avapro and started Cardizem CD 240 mg daily.  Here today for follow-up. He states he has chronic stable dyspnea which he attributes to COPD and long time tobacco smoking. No exertional chest tightness or pressure. HR and BP stable on Cardizem. No orthopnea, PND, syncope, LE edema or melena.    Past Medical History:  Diagnosis Date  . Arthritis   . Chest pain   . COPD (chronic obstructive pulmonary disease) (HCC) 05/18/2019  . Cough 05/18/2019   06/13/2019-AFB-negative Fungal culture-showing yeast Respiratory sputum culture-negative   . GERD (gastroesophageal reflux disease)   . Hyperlipidemia   . Hypertension     History reviewed. No pertinent surgical history.  Current Medications: Prior to Admission medications   Medication Sig Start Date End Date Taking? Authorizing Provider  albuterol (VENTOLIN HFA) 108 (90 Base) MCG/ACT inhaler INHALE 1 TO 2 PUFFS INTO THE LUNGS DAILY AS NEEDED FOR SHORTNESS OF BREATH. 03/26/20   Leslye Peer, MD  Alirocumab (PRALUENT) 150 MG/ML SOAJ Inject 1 pen into the skin every 14 (fourteen) days. 12/08/19   Nahser, Deloris Ping, MD  budesonide (PULMICORT) 0.5 MG/2ML nebulizer solution Take 2 mLs (0.5 mg total) by  nebulization 2 (two) times daily. 10/14/19 01/12/20  Leslye Peer, MD  diltiazem (DILTIAZEM CD) 240 MG 24 hr capsule Take 1 capsule (240 mg total) by mouth daily. 01/12/20   Nahser, Deloris Ping, MD  diphenhydrAMINE (BENADRYL) 25 MG tablet Take 25 mg by mouth daily.    [provider]  doxycycline (VIBRA-TABS) 100 MG tablet Take 1 tablet (100 mg total) by mouth 2 (two) times daily. 04/06/20   Leslye Peer, MD  hydrochlorothiazide (MICROZIDE) 12.5 MG capsule Take 1 capsule (12.5 mg total) by mouth daily. 09/13/19   Leslye Peer, MD  Icosapent Ethyl (VASCEPA) 0.5 g CAPS Take 4 capsules by mouth 2 (two) times daily. 10/31/19   Nahser, Deloris Ping, MD  ipratropium-albuterol (DUONEB) 0.5-2.5 (3) MG/3ML SOLN Take 3 mLs by nebulization every 12 (twelve) hours. As needed for wheezing    [provider]  Omega-3 Fatty Acids (FISH OIL) 1000 MG CAPS Take 1 g by mouth 2 (two) times daily. Take 2 capsules, 2 times daily 12/26/19   [provider]  predniSONE (DELTASONE) 5 MG tablet Take 2tabs daily x5 days and then continue on 1 tab daily until follow up 04/10/20   Leslye Peer, MD  Respiratory Therapy Supplies (FLUTTER) DEVI Use as directed 06/22/19   Leslye Peer, MD  Revefenacin (YUPELRI) 175 MCG/3ML SOLN Inhale 1 ampule into the lungs daily. 01/11/20   [name], NP    Allergies:   Penicillins and Sulfonamide derivatives  Social History   Socioeconomic History  . Marital status: Married    Spouse name: Not on file  . Number of children: 4  . Years of education: Not on file  . Highest education level: Not on file  Occupational History  . Occupation: Retired  Tobacco Use  . Smoking status: Former Smoker    Packs/day: 0.50    Years: 60.00    Pack years: 30.00    Types: Cigarettes    Quit date: 04/27/2019    Years since quitting: 0.9  . Smokeless tobacco: Never Used  Substance and Sexual Activity  . Alcohol use: No  . Drug use: No  . Sexual activity: Not on  file  Other Topics Concern  . Not on file  Social History Narrative  . Not on file   Social Determinants of Health   Financial Resource Strain:   . Difficulty of Paying Living Expenses:   Food Insecurity:   . Worried About Charity fundraiser in the Last Year:   . Arboriculturist in the Last Year:   Transportation Needs:   . Film/video editor (Medical):   Marland Kitchen Lack of Transportation (Non-Medical):   Physical Activity:   . Days of Exercise per Week:   . Minutes of Exercise per Session:   Stress:   . Feeling of Stress :   Social Connections:   . Frequency of Communication with Friends and Family:   . Frequency of Social Gatherings with Friends and Family:   . Attends Religious Services:   . Active Member of Clubs or Organizations:   . Attends Archivist Meetings:   Marland Kitchen Marital Status:      Family History:  The patient's family history includes Heart disease in his brother and father; Hyperlipidemia in his brother.   ROS:   Please see the history of present illness.    ROS All other systems reviewed and are negative.   PHYSICAL EXAM:   VS:  BP 108/62   Pulse 82   Ht 6\' 1"  (1.854 m)   Wt 223 lb 8 oz (101.4 kg)   SpO2 97%   BMI 29.49 kg/m    GEN: Well nourished, well developed, in no acute distress  HEENT: normal  Neck: no JVD, carotid bruits, or masses Cardiac: RRR; no murmurs, rubs, or gallops,no edema  Respiratory: Expiratory wheezing diffusely  GI: soft, nontender, nondistended, + BS MS: no deformity or atrophy  Skin: warm and dry, no rash Neuro:  Alert and Oriented x 3, Strength and sensation are intact Psych: euthymic mood, full affect  Wt Readings from Last 3 Encounters:  04/13/20 223 lb 8 oz (101.4 kg)  03/19/20 231 lb 3.2 oz (104.9 kg)  01/12/20 234 lb (106.1 kg)      Studies/Labs Reviewed:   EKG:  EKG is not  ordered today.    Recent Labs: 10/06/2019: BUN 18; Creatinine, Ser 1.10; Hemoglobin 15.5; Platelets 212; Potassium 4.2; Sodium  137; TSH 1.770 12/07/2019: ALT 32   Lipid Panel    Component Value Date/Time   CHOL 149 12/07/2019 0759   TRIG 172 (H) 12/07/2019 0759   HDL 43 12/07/2019 0759   CHOLHDL 3.5 12/07/2019 0759   CHOLHDL 6.0 08/10/2010 0157   VLDL 42 (H) 08/10/2010 0157   LDLCALC 77 12/07/2019 0759   LDLDIRECT 83 12/07/2019 0759    Additional studies/ records that were reviewed today include:   Echocardiogram: 09/2019 1. Left ventricular ejection fraction, by visual estimation, is 60  to  65%. The left ventricle has normal function. Normal left ventricular size.  There is no left ventricular hypertrophy.  2. Left ventricular diastolic Doppler parameters are consistent with  impaired relaxation pattern of LV diastolic filling.  3. Global right ventricle has normal systolic function.The right  ventricular size is normal. No increase in right ventricular wall  thickness.  4. Left atrial size was normal.  5. Right atrial size was normal.  6. The mitral valve is normal in structure. No evidence of mitral valve  regurgitation. No evidence of mitral stenosis.  7. The tricuspid valve is normal in structure. Tricuspid valve  regurgitation was not visualized by color flow Doppler.  8. The aortic valve is normal in structure. Aortic valve regurgitation is  trivial by color flow Doppler. Structurally normal aortic valve, with no  evidence of sclerosis or stenosis.  9. The pulmonic valve was normal in structure. Pulmonic valve  regurgitation is not visualized by color flow Doppler.  10. 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  11. The inferior vena cava is normal in size with greater than 50%  respiratory variability, suggesting right atrial pressure of 3 mmHg.   Stress test 09/2019  Nuclear stress EF: 65%.  There was no ST segment deviation noted during stress.  No T wave inversion was noted during stress.  This is a low risk study.  The study is normal.   Normal  perfusion. LVEF 65% with normal wall motion. This is a low risk study. No changes compared to prior study in 2011.  Cardiac CT 09/2019 IMPRESSION: Coronary calcium score of 1612. This was 55 percentile for age and sex matched control.   ASSESSMENT & PLAN:    1. HTN - BP stable on current medication  2. Chronic DOE - patient reports stable. Seem multi factorial from prior tobacco smoking with COPD, obesity, inactivity and dCHF. Echo with normal LVEF. Normal Stress test . Cardiac CT with coronary calcium score of 1612. This was 82 percentile for age and sex matched control. Reports easy brushing on ASA and wish to stay off. Stats " blood stays thin with use of fish oil".    3. COPD - diffuse expiratory wheezing. Use PRN albuterol.  - Encouraged continue abstiance of tobacco smoking    4. HLD - Continue Vascepa and fish oil - 12/07/2019: Cholesterol, Total 149; HDL 43; LDL Chol Calc (NIH) 77; Triglycerides 172  - Followed by PCP    Medication Adjustments/Labs and Tests Ordered: Current medicines are reviewed at length with the patient today.  Concerns regarding medicines are outlined above.  Medication changes, Labs and Tests ordered today are listed in the Patient Instructions below. Patient Instructions  Medication Instructions:  Your physician recommends that you continue on your current medications as directed. Please refer to the Current Medication list given to you today.  *If you need a refill on your cardiac medications before your next appointment, please call your pharmacy*   Lab Work: None ordered If you have labs (blood work) drawn today and your tests are completely normal, you will receive your results only by: Marland Kitchen MyChart Message (if you have MyChart) OR . A paper copy in the mail If you have any lab test that is abnormal or we need to change your treatment, we will call you to review the results.   Testing/Procedures: None ordered   Follow-Up: At [facility], you and your health needs are our priority.  As part of  our continuing mission to provide you with exceptional heart care, we have created designated Provider Care Teams.  These Care Teams include your primary Cardiologist (physician) and Advanced Practice Providers (APPs -  Physician Assistants and Nurse Practitioners) who all work together to provide you with the care you need, when you need it.  We recommend signing up for the patient portal called "MyChart".  Sign up information is provided on this After Visit Summary.  MyChart is used to connect with patients for Virtual Visits (Telemedicine).  Patients are able to view lab/test results, encounter notes, upcoming appointments, etc.  Non-urgent messages can be sent to your provider as well.   To learn more about what you can do with MyChart, go to [email or web address].    Your next appointment:   6 month(s)  The format for your next appointment:   In Person  Provider:   You may see Dr. Elease Hashimoto  or one of the following Advanced Practice Providers on your designated Care Team:    Tereso Newcomer, PA-C  Vin [place], [place]  Berton Bon, NP    Other Instructions      Leonides Schanz Manson [place], [place]  04/13/2020 2:04 PM    [facility] [address] [place], [place], [place]  [address] Phone: [phone]; Fax: [phone]

## 2020-04-16 ENCOUNTER — Other Ambulatory Visit: Payer: Self-pay

## 2020-04-16 ENCOUNTER — Ambulatory Visit: Payer: Medicare HMO | Admitting: Emergency Medicine

## 2020-04-16 ENCOUNTER — Encounter: Payer: Self-pay | Admitting: Emergency Medicine

## 2020-04-16 DIAGNOSIS — J438 Other emphysema: Secondary | ICD-10-CM

## 2020-04-16 MED ORDER — DOXYCYCLINE HYCLATE 100 MG PO TABS: 100.0000 mg | ORAL_TABLET | Freq: Two times a day (BID) | ORAL | 5 refills | Status: AC

## 2020-04-16 MED ORDER — CLARITHROMYCIN 250 MG PO TABS: 250.0000 mg | ORAL_TABLET | Freq: Two times a day (BID) | ORAL | 5 refills | Status: AC

## 2020-04-16 MED ORDER — CEFUROXIME AXETIL 250 MG PO TABS: 250.0000 mg | ORAL_TABLET | Freq: Two times a day (BID) | ORAL | 5 refills | Status: AC

## 2020-04-16 NOTE — Assessment & Plan Note (Signed)
He backtracked when we try to come off the prednisone, particularly with regard to his mucus burden, cough, sputum production.  I will get him back to 5 mg of prednisone daily and try adding rotating antibiotics.  Once these are on board we may be able to try weaning the prednisone again.  Complete your prednisone 10 mg once daily, taper to 5 mg once daily as we have planned and stay on this dose. We will start rotating antibiotics that you will take for the first week of each month as directed: -Doxycycline 100 mg 100 mg twice a day for 5 days -Clarithromycin 250 mg twice a day for 5 days -Cefuroxime 250 mg twice a day for 5 days Continue Yupelri once daily Continue Pulmicort nebulizer once daily Follow with Dr. Delton Coombes in 2 months or sooner if you have any problems.

## 2020-04-16 NOTE — Patient Instructions (Signed)
Complete your prednisone 10 mg once daily, taper to 5 mg once daily as we have planned and stay on this dose. We will start rotating antibiotics that you will take for the first week of each month as directed: -Doxycycline 100 mg 100 mg twice a day for 5 days -Clarithromycin 250 mg twice a day for 5 days -Cefuroxime 250 mg twice a day for 5 days Continue Yupelri once daily Continue Pulmicort nebulizer once daily Follow with Dr. Delton Coombes in 2 months or sooner if you have any problems.

## 2020-04-16 NOTE — Progress Notes (Signed)
Subjective:    Patient ID: Tristan Le, male    DOB: 23-Oct-1941, 79 y.o.   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  He was referred today for initial evaluation of suspected COPD.  PFT revealed mixed restriction and obstruction with a normal FEV1.  He has been requiring chronic prednisone, most recently managed with Yupelri and Pulmicort nebs twice daily.  At his last visit 1 month ago I asked him to try to wean his prednisone.  He weaned to 2.5mg  daily, never weaned to off. He was treated in the interim with a course of doxycycline and 20mg  pred x 5 days for increased phlegm, difficulty clearing secretions. He also started using mucinex more reliably. Difficult to say which intervention helped him the most. Using DuoNeb bid, uses albuterol HFA rarely. Close to his exertional baseline, minimal wheeze. Secretions much better.  His nebulizer machine is not working properly, sometimes takes him 45 minutes to deliver his medication.  ROV 04/16/20 --follow-up visit for 79 year old gentleman with hypertension, hyperlipidemia, DJD, GERD and COPD.  He has mixed disease on pulmonary function testing.  Have been managing him with Yupelri and Pulmicort.  He has also been on longstanding prednisone.  He had been dealing with increased secretions and dyspnea that seem to respond most to doxycycline.  I attempted to wean his prednisone to off with thoughts may change him to cyclical antibiotics.  He called me as the prednisone was waning to let me know that he was having increased dyspnea and sputum. I placed him on 10mg  with plan to taper to 5mg . Also took doxycycline.     Review of Systems  Constitutional: Negative for fever and unexpected weight change.  HENT: Negative for congestion, dental problem, ear pain, nosebleeds, postnasal drip, rhinorrhea, sinus pressure,  sneezing, sore throat and trouble swallowing.   Eyes: Negative for redness and itching.  Respiratory: Positive for cough, shortness of breath and wheezing. Negative for chest tightness.   Cardiovascular: Positive for chest pain. Negative for palpitations and leg swelling.  Gastrointestinal: Negative for nausea and vomiting.  Genitourinary: Negative for dysuria.  Musculoskeletal: Negative for joint swelling.  Skin: Negative for rash.  Neurological: Negative for headaches.  Hematological: Does not bruise/bleed easily.  Psychiatric/Behavioral: Negative for dysphoric mood. The patient is not nervous/anxious.     Past Medical History:  Diagnosis Date  . Arthritis   . Chest pain   . COPD (chronic obstructive pulmonary disease) (HCC) 05/18/2019  . Cough 05/18/2019   06/13/2019-AFB-negative Fungal culture-showing yeast Respiratory sputum culture-negative   . GERD (gastroesophageal reflux disease)   . Hyperlipidemia   . Hypertension      Family History  Problem Relation Age of Onset  . Heart disease Father   . Heart disease Brother   . Hyperlipidemia Brother      Social History   Socioeconomic History  . Marital status: Married    Spouse name: Not on file  . Number of children: 4  . Years of education: Not on file  . Highest education level: Not on file  Occupational History  . Occupation: Retired  Tobacco Use  . Smoking status: Former Smoker    Packs/day: 0.50    Years: 60.00    Pack years: 30.00    Types: Cigarettes    Quit date: 04/27/2019    Years since quitting: 0.9  . Smokeless tobacco:  Never Used  Substance and Sexual Activity  . Alcohol use: No  . Drug use: No  . Sexual activity: Not on file  Other Topics Concern  . Not on file  Social History Narrative  . Not on file   Social Determinants of Health   Financial Resource Strain:   . Difficulty of Paying Living Expenses:   Food Insecurity:   . Worried About Programme researcher, broadcasting/film/video in the Last Year:   . Garment/textile technologist in the Last Year:   Transportation Needs:   . Freight forwarder (Medical):   Marland Kitchen Lack of Transportation (Non-Medical):   Physical Activity:   . Days of Exercise per Week:   . Minutes of Exercise per Session:   Stress:   . Feeling of Stress :   Social Connections:   . Frequency of Communication with Friends and Family:   . Frequency of Social Gatherings with Friends and Family:   . Attends Religious Services:   . Active Member of Clubs or Organizations:   . Attends Banker Meetings:   Marland Kitchen Marital Status:   Intimate Partner Violence:   . Fear of Current or Ex-Partner:   . Emotionally Abused:   Marland Kitchen Physically Abused:   . Sexually Abused:   has had many jobs - Psychologist, forensic, has been exposed to wood dust, occasionally wore a mask No military  Has lived in [place], [place], [place], [place]  Allergies  Allergen Reactions  . Penicillins     REACTION: Upset stomach,mouth ulcers,rash  . Sulfonamide Derivatives     REACTION: Rash with huge blisters     Outpatient Medications Prior to Visit  Medication Sig Dispense Refill  . albuterol (VENTOLIN HFA) 108 (90 Base) MCG/ACT inhaler INHALE 1 TO 2 PUFFS INTO THE LUNGS DAILY AS NEEDED FOR SHORTNESS OF BREATH. 56 g 1  . Alirocumab (PRALUENT) 150 MG/ML SOAJ Inject 1 pen into the skin every 14 (fourteen) days. 2 pen 11  . diltiazem (DILTIAZEM CD) 240 MG 24 hr capsule Take 1 capsule (240 mg total) by mouth daily. 30 capsule 11  . diphenhydrAMINE (BENADRYL) 25 MG tablet Take 25 mg by mouth daily.    Marland Kitchen guaiFENesin (MUCINEX) 600 MG 12 hr tablet Take 1 tablet by mouth as needed.    . hydrochlorothiazide (MICROZIDE) 12.5 MG capsule Take 1 capsule (12.5 mg total) by mouth daily. 30 capsule 0  . Icosapent Ethyl (VASCEPA) 0.5 g CAPS Take 4 capsules by mouth 2 (two) times daily. 720 capsule 3  . ipratropium-albuterol (DUONEB) 0.5-2.5 (3) MG/3ML SOLN Take 3 mLs by nebulization every 12 (twelve) hours. As needed for wheezing    . Omega-3 Fatty Acids  (FISH OIL) 1000 MG CAPS Take 1 g by mouth 2 (two) times daily. Take 2 capsules, 2 times daily    . predniSONE (DELTASONE) 5 MG tablet Take 2tabs daily x5 days and then continue on 1 tab daily until follow up 30 tablet 0  . Respiratory Therapy Supplies (FLUTTER) DEVI Use as directed 1 each 0  . Revefenacin (YUPELRI) 175 MCG/3ML SOLN Inhale 1 ampule into the lungs daily. 90 mL 0  . budesonide (PULMICORT) 0.5 MG/2ML nebulizer solution Take 2 mLs (0.5 mg total) by nebulization 2 (two) times daily. 360 mL 1  . doxycycline (VIBRA-TABS) 100 MG tablet Take 1 tablet (100 mg total) by mouth 2 (two) times daily. 14 tablet 0   Facility-Administered Medications Prior to Visit  Medication Dose Route Frequency Provider Last Rate Last  Admin  . 0.9 %  sodium chloride infusion  500 mL Intravenous Once Mauri Pole, MD            Objective:   Physical Exam Vitals:   04/16/20 1143  BP: 140/74  Pulse: 89  SpO2: 94%  Weight: 223 lb (101.2 kg)  Height: 6\' 1"  (1.854 m)   Gen: Pleasant, well-nourished, in no distress,  normal affect  ENT: No lesions,  mouth clear, edentulous, oropharynx clear, no postnasal drip  Neck: No JVD, no stridor  Lungs: No use of accessory muscles, no crackles, does wheeze at end expiration B  Cardiovascular: RRR, heart sounds normal, no murmur or gallops, no peripheral edema  Musculoskeletal: No deformities, no cyanosis or clubbing  Neuro: alert, awake, non focal  Skin: Warm, no lesions or rash     Assessment & Plan:  COPD (chronic obstructive pulmonary disease) ([facility]) He backtracked when we try to come off the prednisone, particularly with regard to his mucus burden, cough, sputum production.  I will get him back to 5 mg of prednisone daily and try adding rotating antibiotics.  Once these are on board we may be able to try weaning the prednisone again.  Complete your prednisone 10 mg once daily, taper to 5 mg once daily as we have planned and stay on this  dose. We will start rotating antibiotics that you will take for the first week of each month as directed: -Doxycycline 100 mg 100 mg twice a day for 5 days -Clarithromycin 250 mg twice a day for 5 days -Cefuroxime 250 mg twice a day for 5 days Continue Yupelri once daily Continue Pulmicort nebulizer once daily Follow with Dr. Lamonte Sakai in 2 months or sooner if you have any problems.  Baltazar Apo, MD, PhD 04/16/2020, 11:57 AM [facility] [phone] or if no answer [phone]

## 2020-04-18 ENCOUNTER — Encounter: Payer: Self-pay | Admitting: Pediatrics

## 2020-04-18 ENCOUNTER — Ambulatory Visit: Payer: Medicare HMO | Admitting: Pediatrics

## 2020-04-18 ENCOUNTER — Other Ambulatory Visit: Payer: Self-pay

## 2020-04-18 VITALS — BP 116/64 | HR 87 | Temp 98.4°F | Resp 20 | Ht 72.5 in | Wt 224.8 lb

## 2020-04-18 DIAGNOSIS — L500 Allergic urticaria: Secondary | ICD-10-CM

## 2020-04-18 DIAGNOSIS — J301 Allergic rhinitis due to pollen: Secondary | ICD-10-CM | POA: Diagnosis not present

## 2020-04-18 DIAGNOSIS — L2089 Other atopic dermatitis: Secondary | ICD-10-CM

## 2020-04-18 DIAGNOSIS — J449 Chronic obstructive pulmonary disease, unspecified: Secondary | ICD-10-CM | POA: Diagnosis not present

## 2020-04-18 DIAGNOSIS — L503 Dermatographic urticaria: Secondary | ICD-10-CM

## 2020-04-18 MED ORDER — TRIAMCINOLONE ACETONIDE 0.1 % EX CREA: TOPICAL_CREAM | CUTANEOUS | 3 refills | Status: DC

## 2020-04-18 NOTE — Progress Notes (Addendum)
[address] [place] [address] Dept: [phone]  New Patient Note  Patient ID: Tristan Le, male    DOB: Dec 02, 1941  Age: 79 y.o. MRN: [id number] Date of Office Visit: 04/18/2020 Referring provider: [name], [address] [place],  [place] [address]    Chief Complaint: Pruritis  HPI Tristan Le presents for evaluation of some itching of his skin for about 40 years.  He did have hives about 40 years ago .  His itching seems to get worse after eating chocolate, berries and dark sodas..  He has a history of seasonal allergic rhinitis in the springtime.  He has COPD and is followed by pulmonologist.  Occasionally he has heartburn.  He has had one episode of pneumonia in the past.  He has not had sinus infections.  For his COPD he is on budesonide 0.5-1 unit dose twice a day, prednisone 5 mg once a day, Yuperli 75 MCG's once a day in a nebulizer ,, Mucinex twice a day, add DuoNeb twice a day and albuterol HFA if needed..  Every 3 months for 5 days he is on alternating doses of doxycycline, clarithromycin and cefuroxime    Review of Systems  Constitutional: Negative.   HENT:       Seasonal allergic rhinitis in the springtime  Eyes:       Lens implants  Respiratory:       COPD  Cardiovascular:       Hypertension  Gastrointestinal:       Occasional heartburn  Genitourinary: Negative.   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  Hives 40 years ago.  Neurological: Negative.   Endo/Heme/Allergies:       No diabetes or thyroid disease  Psychiatric/Behavioral: Negative.     Outpatient Encounter Medications as of 04/18/2020  Medication Sig  . albuterol (VENTOLIN HFA) 108 (90 Base) MCG/ACT inhaler INHALE 1 TO 2 PUFFS INTO THE LUNGS DAILY AS NEEDED FOR SHORTNESS OF BREATH.  Marland Kitchen Alirocumab (PRALUENT) 150 MG/ML SOAJ Inject 1 pen into the skin every 14 (fourteen) days.  . cefUROXime (CEFTIN) 250 MG tablet  Take 1 tablet (250 mg total) by mouth 2 (two) times daily with a meal for 5 days.  . clarithromycin (BIAXIN) 250 MG tablet Take 1 tablet (250 mg total) by mouth 2 (two) times daily for 5 days.  Marland Kitchen diltiazem (DILTIAZEM CD) 240 MG 24 hr capsule Take 1 capsule (240 mg total) by mouth daily.  . diphenhydrAMINE (BENADRYL) 25 MG tablet Take 25 mg by mouth daily.  Marland Kitchen doxycycline (VIBRA-TABS) 100 MG tablet Take 1 tablet (100 mg total) by mouth 2 (two) times daily for 5 days.  Marland Kitchen guaiFENesin (MUCINEX) 600 MG 12 hr tablet Take 1 tablet by mouth as needed.  . hydrochlorothiazide (MICROZIDE) 12.5 MG capsule Take 1 capsule (12.5 mg total) by mouth daily.  Vanessa Kick Ethyl (VASCEPA) 0.5 g CAPS Take 4 capsules by mouth 2 (two) times daily.  Marland Kitchen ipratropium-albuterol (DUONEB) 0.5-2.5 (3) MG/3ML SOLN Take 3 mLs by nebulization every 12 (twelve) hours. As needed for wheezing  . Omega-3 Fatty Acids (FISH OIL) 1000 MG CAPS Take 1 g by mouth 2 (two) times daily. Take 2 capsules, 2 times daily  . predniSONE (DELTASONE) 5 MG tablet Take 2tabs daily x5 days and then continue on 1 tab daily until follow up  . Respiratory Therapy Supplies (FLUTTER) DEVI Use  as directed  . Revefenacin (YUPELRI) 175 MCG/3ML SOLN Inhale 1 ampule into the lungs daily.  . budesonide (PULMICORT) 0.5 MG/2ML nebulizer solution Take 2 mLs (0.5 mg total) by nebulization 2 (two) times daily.  Marland Kitchen triamcinolone cream (KENALOG) 0.1 % Apply twice day if needed to red itchy areas below the face.  Wait 10 minutes and then [place] Eucerin cream in the bad areas and lotion elsewhere.   Facility-Administered Encounter Medications as of 04/18/2020  Medication  . 0.9 %  sodium chloride infusion     Drug Allergies:  Allergies  Allergen Reactions  . Penicillins     REACTION: Upset stomach,mouth ulcers,rash  . Sulfonamide Derivatives     REACTION: Rash with huge blisters    Family History: Raffael's family history includes Heart disease in his brother and father;  Hyperlipidemia in his brother..  Family history is positive for asthma, eczema and chronic bronchitis.  Family history is negative for hayfever, sinus problems, angioedema, urticaria, food allergies, emphysema.  Social and environmental.  There are no pets in the home.  [name].  He quit smoking about a year ago after smoking cigarettes for 60 years averaging about half a pack per day  Physical Exam: BP 116/64   Pulse 87   Temp 98.4 F (36.9 C) (Temporal)   Resp 20   Ht 6' 0.5" (1.842 m)   Wt 224 lb 12.8 oz (102 kg)   SpO2 94%   BMI 30.07 kg/m    Physical Exam Vitals reviewed.  Constitutional:      Appearance: Normal appearance. He is normal weight.  HENT:     Head:     Comments: Eyes normal.  Ears normal.  Nose mild swelling of the nasal turbinates.  Pharynx normal. Neck:     Comments: No thyromegaly Cardiovascular:     Rate and Rhythm: Normal rate and regular rhythm.     Comments: S1-S2 normal no murmurs Pulmonary:     Comments: Increased anteroposterior diameter.  Lungs were clear to percussion and auscultation Abdominal:     Palpations: Abdomen is soft.     Tenderness: There is no abdominal tenderness.     Comments: No hepatosplenomegaly  Musculoskeletal:     Cervical back: Neck supple.  Lymphadenopathy:     Cervical: No cervical adenopathy.  Skin:    Comments: Very dry.  Mild erythema in the left antecubital fossa .  He had dermographia present  Neurological:     General: No focal deficit present.     Mental Status: He is alert and oriented to person, place, and time. Mental status is at baseline.  Psychiatric:        Mood and Affect: Mood normal.        Behavior: Behavior normal.        Thought Content: Thought content normal.        Judgment: Judgment normal.     Diagnostics: Allergy skin tests were positive to grass pollens.  Mild reactions to some molds.  Skin testing to foods was negative   Assessment  Assessment and  Plan: 1. Allergic urticaria   2. Seasonal allergic rhinitis due to pollen   3. COPD mixed type (HCC)   4. Dermographia   5. Flexural atopic dermatitis     Meds ordered this encounter  Medications  . triamcinolone cream (KENALOG) 0.1 %    Sig: Apply twice day if needed to red itchy areas below the face.  Wait 10 minutes and  then [place] Eucerin cream in the bad areas and lotion elsewhere.    Dispense:  45 g    Refill:  3    Patient Instructions  Claritin 10 mg-take 1 tablet once a day if needed for runny nose or itching Daily shower for 10 minutes.  Pat dry and use triamcinolone 0.1% cream twice a day if needed to red itchy areas below the face..  Wait 10 minutes and then use Eucerin cream in the bad areas and lotion elsewhere Prednisone 10 mg twice a day for 4 days then back to 5 mg once a day as prescribed by your pulmonologist to try to bring your eczema under control  Avoid chocolate, berries and dark sodas Do foods with salicylates or lots of citric acid make you itch?  Continue on your other medications  Call us if you are not doing well on this treatment plan    Return in about 4 weeks (around 05/16/2020).   Thank you for the opportunity to care for this patient.  Please do not hesitate to contact me with questions.  Tonette Bihari, M.D.  [facility] [address] [place], [place] [address] [phone]

## 2020-04-18 NOTE — Patient Instructions (Addendum)
Claritin 10 mg-take 1 tablet once a day if needed for runny nose or itching Daily shower for 10 minutes.  Pat dry and use triamcinolone 0.1% cream twice a day if needed to red itchy areas below the face..  Wait 10 minutes and then use Eucerin cream in the bad areas and lotion elsewhere Prednisone 10 mg twice a day for 4 days then back to 5 mg once a day as prescribed by your pulmonologist to try to bring your eczema under control  Avoid chocolate, berries and dark sodas Do foods with salicylates or lots of citric acid make you itch?  Continue on your other medications  Call us if you are not doing well on this treatment plan

## 2020-05-24 ENCOUNTER — Ambulatory Visit: Payer: Medicare HMO | Admitting: Family Medicine

## 2020-06-18 ENCOUNTER — Other Ambulatory Visit: Payer: Self-pay | Admitting: Primary Care

## 2020-06-18 ENCOUNTER — Other Ambulatory Visit: Payer: Self-pay

## 2020-06-18 ENCOUNTER — Ambulatory Visit: Payer: Medicare HMO | Admitting: Emergency Medicine

## 2020-06-18 ENCOUNTER — Encounter: Payer: Self-pay | Admitting: Emergency Medicine

## 2020-06-18 DIAGNOSIS — J438 Other emphysema: Secondary | ICD-10-CM

## 2020-06-18 NOTE — Progress Notes (Deleted)
HPI Patient presents today to [facility] to see pharmacy team for ***.  Past medical history includes ***  Number of hospitalizations in past year: Number of ***COPD/asthma exacerbations in past year:   Respiratory Medications Current: *** Tried in past: *** Patient reports {Adherence challenges yes no:[id number]::"adherence challenges","no known adherence challenges"}  OBJECTIVE Allergies  Allergen Reactions  . Penicillins     REACTION: Upset stomach,mouth ulcers,rash  . Sulfonamide Derivatives     REACTION: Rash with huge blisters    Outpatient Encounter Medications as of 06/21/2020  Medication Sig  . albuterol (VENTOLIN HFA) 108 (90 Base) MCG/ACT inhaler INHALE 1 TO 2 PUFFS INTO THE LUNGS DAILY AS NEEDED FOR SHORTNESS OF BREATH.  Marland Kitchen Alirocumab (PRALUENT) 150 MG/ML SOAJ Inject 1 pen into the skin every 14 (fourteen) days.  . budesonide (PULMICORT) 0.5 MG/2ML nebulizer solution Take 2 mLs (0.5 mg total) by nebulization 2 (two) times daily.  Marland Kitchen diltiazem (DILTIAZEM CD) 240 MG 24 hr capsule Take 1 capsule (240 mg total) by mouth daily.  . diphenhydrAMINE (BENADRYL) 25 MG tablet Take 25 mg by mouth daily. (Patient not taking: Reported on 06/18/2020)  . guaiFENesin (MUCINEX) 600 MG 12 hr tablet Take 1 tablet by mouth as needed. (Patient not taking: Reported on 06/18/2020)  . hydrochlorothiazide (MICROZIDE) 12.5 MG capsule Take 1 capsule (12.5 mg total) by mouth daily.  Bess Harvest Ethyl (VASCEPA) 0.5 g CAPS Take 4 capsules by mouth 2 (two) times daily.  Marland Kitchen ipratropium-albuterol (DUONEB) 0.5-2.5 (3) MG/3ML SOLN Take 3 mLs by nebulization every 12 (twelve) hours. As needed for wheezing  . Omega-3 Fatty Acids (FISH OIL) 1000 MG CAPS Take 1 g by mouth 2 (two) times daily. Take 2 capsules, 2 times daily  . predniSONE (DELTASONE) 5 MG tablet Take 2tabs daily x5 days and then continue on 1 tab daily until follow up  . Respiratory Therapy Supplies (FLUTTER) DEVI Use as directed  .  Revefenacin (YUPELRI) 175 MCG/3ML SOLN Inhale 1 ampule into the lungs daily.  Marland Kitchen triamcinolone cream (KENALOG) 0.1 % Apply twice day if needed to red itchy areas below the face.  Wait 10 minutes and then [place] Eucerin cream in the bad areas and lotion elsewhere.   Facility-Administered Encounter Medications as of 06/21/2020  Medication  . 0.9 %  sodium chloride infusion     Immunization History  Administered Date(s) Administered  . PFIZER SARS-COV-2 Vaccination 02/24/2020, 03/20/2020     PFTs PFT Results Latest Ref Rng & Units 08/16/2019  FVC-Pre L 4.29  FVC-Predicted Pre % 91  FVC-Post L 4.09  FVC-Predicted Post % 86  Pre FEV1/FVC % % 76  Post FEV1/FCV % % 81  FEV1-Pre L 3.28  FEV1-Predicted Pre % 96  FEV1-Post L 3.33  DLCO UNC% % 77  DLCO COR %Predicted % 88  TLC L 5.91  TLC % Predicted % 77  RV % Predicted % 60     Eosinophils Most recent blood eosinophil count was *** cells/microL taken on ***.   IgE   Assessment   1. Inhaler Optimization  PIFR . Inspiratory flow measured using the In-check DIAL G16 and was in range of 30-90 L/min for use of high resistance DPI device (Handihaler). Optimal range is greater than 30 L/min.  Patient scored ***. Marland Kitchen Inspiratory flow measured using the In-check DIAL G16 and was in range of 30-90 for use of medium resistance DPI device (Pressair). Optimal range is greater than 45 L/min.  Patient scored ***. Marland Kitchen Inspiratory flow measured using  the In-check DIAL G16 and was in range of 30-90 for use of medium-low resistance DPI device (Ellipta, Diskus). Optimal range is greater than 60 L/min.  Patient scored ***. Marland Kitchen Inspiratory flow measured using the In-check DIAL G16 and was in range of 20-60 for use of pMDI device (Respimat).  A lower rate is better. Patient scored ***.  Optimal inhaler for patient would be *** considering ***.  Patient was counseled on the purpose, proper use, and adverse effects of *** inhaler.  Instructed patient to rinse  mouth with water after using in order to prevent fungal infection.  Patient verbalized understanding.  Reviewed appropriate use of maintenance vs rescue inhalers.  Stressed importance of using maintenance inhaler daily and rescue inhaler only as needed.  Patient verbalized understanding.  Demonstrated proper inhaler technique using *** demo inhaler.  Patient able to demonstrate proper inhaler technique using teach back method. Patient was given sample in office today.  His inhaler was primed and able to administer first dose in office without issue.  2. Medication Reconciliation  A drug regimen assessment was performed, including review of allergies, interactions, disease-state management, dosing and immunization history. Medications were reviewed with the patient, including name, instructions, indication, goals of therapy, potential side effects, importance of adherence, and safe use.  Drug interaction(s): ***  3. Immunizations  Patient is indicated for the influenzae, pneumonia, and shingles vaccinations.  PLAN ***  All questions encouraged and answered.  Instructed patient to reach out with any further questions or concerns.  Thank you for allowing pharmacy to participate in this patient's care.  This appointment required *** minutes of patient care (this includes precharting, chart review, review of results, face-to-face care, etc.).

## 2020-06-18 NOTE — Progress Notes (Signed)
Subjective:    Patient ID: Tristan Le, male    DOB: 01-14-1941, 79 y.o.   MRN: [id number]  HPI 79 year old former smoker (30 pack years, quit 3 weeks ago) with a history of hypertension, hyperlipidemia, degenerative disc disease with chronic back pain, GERD.  He was referred today for initial evaluation of suspected COPD.  PFT revealed mixed restriction and obstruction with a normal FEV1.  He has been requiring chronic prednisone, most recently managed with Yupelri and Pulmicort nebs twice daily.  At his last visit 1 month ago I asked him to try to wean his prednisone.  He weaned to 2.5mg  daily, never weaned to off. He was treated in the interim with a course of doxycycline and 20mg  pred x 5 days for increased phlegm, difficulty clearing secretions. He also started using mucinex more reliably. Difficult to say which intervention helped him the most. Using DuoNeb bid, uses albuterol HFA rarely. Close to his exertional baseline, minimal wheeze. Secretions much better.  His nebulizer machine is not working properly, sometimes takes him 45 minutes to deliver his medication.  ROV 04/16/20 --follow-up visit for 79 year old gentleman with hypertension, hyperlipidemia, DJD, GERD and COPD.  He has mixed disease on pulmonary function testing.  Have been managing him with Yupelri and Pulmicort.  He has also been on longstanding prednisone.  He had been dealing with increased secretions and dyspnea that seem to respond most to doxycycline.  I attempted to wean his prednisone to off with thoughts may change him to cyclical antibiotics.  He called me as the prednisone was waning to let me know that he was having increased dyspnea and sputum. I placed him on 10mg  with plan to taper to 5mg . Also took doxycycline.   ROV 06/18/20 --79 year old man with history of hypertension, hyperlipidemia, GERD, DJD, COPD with mixed restriction and obstruction by pulmonary function testing.  At his last visit we continued his  Yupelri, Pulmicort, tapered his prednisone back down to 5 mg daily and then started him on rotating antibiotics.  He reports that he has benefited, had some improvement in his mucous production. He has noticed also that he has had some trouble with the combination of mucinex and benadryl - was having trouble clearing the mucous, leading to dyspnea. He was recently told that he was going to have to start paying full cost for Yupelri >> not clear why this is. Will consult our clinical pharmacists to trouble shoot   Review of Systems  Constitutional: Negative for fever and unexpected weight change.  HENT: Negative for congestion, dental problem, ear pain, nosebleeds, postnasal drip, rhinorrhea, sinus pressure, sneezing, sore throat and trouble swallowing.   Eyes: Negative for redness and itching.  Respiratory: Positive for cough, shortness of breath and wheezing. Negative for chest tightness.   Cardiovascular: Positive for chest pain. Negative for palpitations and leg swelling.  Gastrointestinal: Negative for nausea and vomiting.  Genitourinary: Negative for dysuria.  Musculoskeletal: Negative for joint swelling.  Skin: Negative for rash.  Neurological: Negative for headaches.  Hematological: Does not bruise/bleed easily.  Psychiatric/Behavioral: Negative for dysphoric mood. The patient is not nervous/anxious.         Objective:   Physical Exam Vitals:   06/18/20 1334  BP: 130/68  Pulse: 80  Temp: 97.8 F (36.6 C)  TempSrc: Oral  SpO2: 94%  Weight: 224 lb 12.8 oz (102 kg)  Height: 6\' 1"  (1.854 m)   Gen: Pleasant, well-nourished, in no distress,  normal affect  ENT: No lesions,  mouth clear, edentulous, oropharynx clear, no postnasal drip  Neck: No JVD, no stridor  Lungs: No use of accessory muscles, no crackles, does wheeze at end expiration B  Cardiovascular: RRR, heart sounds normal, no murmur or gallops, no peripheral edema  Musculoskeletal: No deformities, no cyanosis or  clubbing  Neuro: alert, awake, non focal  Skin: Warm, no lesions or rash     Assessment & Plan:  No problem-specific Assessment & Plan notes found for this encounter.  Levy Pupa, MD, PhD 06/18/2020, 1:37 PM [facility] [phone] or if no answer [phone]

## 2020-06-18 NOTE — Assessment & Plan Note (Signed)
He benefited from the addition of cycled antibiotics.  One issue we need to troubleshoot is the cost of his Mikael Spray.  He was told that he may lose his coverage.  Tolerating prednisone at lower dose, 5 mg daily.  Plan to continue Yupelri.  We will refer you to our clinical pharmacist to ensure that you are Mikael Spray is going to remain available and cost effective. Continue budesonide nebs twice a day Continue prednisone 5 mg once daily. Keep albuterol available to use 2 puffs or 1 nebulizer treatment up to every 4 hours when you need it for shortness of breath, chest tightness, wheezing. Continue your rotating antibiotics (clarithromycin, doxycycline, cefuroxime) as ordered Follow with Dr Delton Coombes in 3 months or sooner if you have any problems.

## 2020-06-18 NOTE — Patient Instructions (Addendum)
° °  Plan to continue Yupelri.  We will refer you to our clinical pharmacist to ensure that you are Tristan Le is going to remain available and cost effective. Continue budesonide nebs twice a day Continue prednisone 5 mg once daily. Keep albuterol available to use 2 puffs or 1 nebulizer treatment up to every 4 hours when you need it for shortness of breath, chest tightness, wheezing. Continue your rotating antibiotics (clarithromycin, doxycycline, cefuroxime) as ordered Follow with Dr Delton Coombes in 3 months or sooner if you have any problems.

## 2020-06-19 ENCOUNTER — Telehealth: Payer: Self-pay | Admitting: Pharmacy Technician

## 2020-06-19 NOTE — Telephone Encounter (Signed)
Thank you for your help.  Okay with me for patient use Yupelri samples that we have him in order to get through the donut hole.

## 2020-06-19 NOTE — Telephone Encounter (Signed)
Pharmacy team- Mikael Spray followup:  Called patient and discussed Mikael Spray, patient states that he had received 2 different notifications stating that Mikael Spray was not covered and then again that it was covered and over $200 for 1 month.  [facility], spoke to rep Dahlia Client, she states that Mikael Spray is covered and that patient is currently in the donut hole. So his copay is currently $237.60 for 1 month supply.  Called [employer] mail order, they are currently filling the Yupelri prescription and is should ship out by tomorrow and deliver later this week.  Called and spoke to patient. Discussed findings and that there is no patient assistance available for Medicare patients for Yupelri at this time. Will hold off on Pharmacy appointment at this time.   Patient says that he has 2 Yupelri on hand and would like to come by office tomorrow to pick up some samples to get him through until he receives shipment sometime later this week.  2:57 PM Dorthula Nettles, CPhT

## 2020-06-19 NOTE — Telephone Encounter (Signed)
Call pt in am and provide samples please

## 2020-06-20 MED ORDER — YUPELRI 175 MCG/3ML IN SOLN: RESPIRATORY_TRACT | 0 refills | Status: DC

## 2020-06-20 NOTE — Telephone Encounter (Signed)
Called pt and advised him that I left samples of Yuperli up front for him to pick up. Pt understood and nothing further is needed.

## 2020-06-21 ENCOUNTER — Other Ambulatory Visit: Payer: Medicare HMO

## 2020-07-13 ENCOUNTER — Other Ambulatory Visit: Payer: Self-pay | Admitting: Pulmonary Disease

## 2020-07-13 DIAGNOSIS — J441 Chronic obstructive pulmonary disease with (acute) exacerbation: Secondary | ICD-10-CM

## 2020-08-01 ENCOUNTER — Other Ambulatory Visit: Payer: Self-pay

## 2020-08-01 MED ORDER — VASCEPA 0.5 G PO CAPS: 4.0000 | ORAL_CAPSULE | Freq: Two times a day (BID) | ORAL | 2 refills | Status: DC

## 2020-08-01 MED ORDER — DILTIAZEM HCL ER COATED BEADS 240 MG PO CP24: 240.0000 mg | ORAL_CAPSULE | Freq: Every day | ORAL | 2 refills | Status: DC

## 2020-08-02 ENCOUNTER — Telehealth: Payer: Self-pay | Admitting: Cardiovascular Disease

## 2020-08-02 NOTE — Telephone Encounter (Signed)
[employer] called requesting a refill, but then noticed the medications were already sent to them.

## 2020-08-23 ENCOUNTER — Telehealth: Payer: Self-pay

## 2020-08-23 MED ORDER — TRIAMCINOLONE ACETONIDE 0.1 % EX CREA: TOPICAL_CREAM | CUTANEOUS | 0 refills | Status: DC

## 2020-08-23 NOTE — Telephone Encounter (Signed)
Received a Rx refill request from [facility] for Triamcinolone, sending in a courtesy refill...patient needs a follow up visit. I will call the patient for him to schedule a follow up visit.

## 2020-09-07 ENCOUNTER — Telehealth: Payer: Self-pay | Admitting: Emergency Medicine

## 2020-09-07 NOTE — Telephone Encounter (Signed)
[facility] pharmacy regarding prescription for doxycycline at [phone].  Unable to find the instructions for the Doxycycline.  After I got off the phone, I found the information regarding the Doxycycline 100 mg twice daily for 5 days.  Rotating with Clarithromycin 250 mg twice a day for 5 days and Cefuroxime 250 mg twice daily for 5 days. First week of each month rotating.  Patient just had Doxycycline filled locally on 08/29/20, will not need it for September.  [facility], gave clarification for Doxycycline and clarithromycin.  Advised by [facility] that it would be in his best interest to have the rotating antibiotics filled at a local pharmacy.  Called and spoke with patient, advised him of what [employer] said.  Patient stated he did not request [employer] to fill his antibiotics.  Patient states he has 3 refills for each antibiotic.  I scheduled him for a f/u in October with Dr. Delton Coombes.  Nothing further needed.

## 2020-09-07 NOTE — Telephone Encounter (Signed)
Patient stated that the Albuterol in his duoneb is making him nervous.  He is wondering is there is another nebulizer he can use that will not cause him to be nervous.  Dr. Delton Coombes, please advise.  Thank you.

## 2020-09-07 NOTE — Telephone Encounter (Signed)
They all have this side effect. One strategy would be to only take half the neb at a time

## 2020-09-08 NOTE — Telephone Encounter (Signed)
Called and spoke with pt letting him know the info stated by RB and he verbalized understanding. Nothing further needed. 

## 2020-09-09 ENCOUNTER — Telehealth: Payer: Self-pay

## 2020-09-09 ENCOUNTER — Other Ambulatory Visit: Payer: Self-pay | Admitting: Emergency Medicine

## 2020-09-18 NOTE — Telephone Encounter (Signed)
I believe this has already been addressed - faxed info to them

## 2020-10-08 ENCOUNTER — Encounter: Payer: Self-pay | Admitting: Emergency Medicine

## 2020-10-08 ENCOUNTER — Other Ambulatory Visit: Payer: Self-pay | Admitting: Pulmonary Disease

## 2020-10-08 ENCOUNTER — Ambulatory Visit: Payer: Medicare HMO | Admitting: Emergency Medicine

## 2020-10-08 ENCOUNTER — Other Ambulatory Visit: Payer: Self-pay

## 2020-10-08 DIAGNOSIS — J441 Chronic obstructive pulmonary disease with (acute) exacerbation: Secondary | ICD-10-CM | POA: Diagnosis not present

## 2020-10-08 DIAGNOSIS — J438 Other emphysema: Secondary | ICD-10-CM

## 2020-10-08 MED ORDER — IPRATROPIUM-ALBUTEROL 0.5-2.5 (3) MG/3ML IN SOLN: RESPIRATORY_TRACT | 11 refills | Status: DC

## 2020-10-08 MED ORDER — BUDESONIDE 0.5 MG/2ML IN SUSP: RESPIRATORY_TRACT | 11 refills | Status: DC

## 2020-10-08 NOTE — Assessment & Plan Note (Signed)
Severe disease with mucopurulent secretions. He is improved since we started scheduled monthly antibiotics. Plan to continue prednisone as ordered. He is concerned about the cost of Yupelri but we have been unable to come off of it, have found an adequate substitution. We could consider changing to a nonnebulized LAMA at some point going forward. I will revisit this with him next time.  Please continue Yupelri nebulizer once daily Continue your budesonide nebulizer twice a day. Rinse and gargle after using this 1 Use your DuoNeb up to every 6 hours as needed for shortness of breath Keep your albuterol inhaler available to use 2 puffs up to every 4 hours if needed for shortness of breath Continue prednisone 5 mg daily Continue rotating your antibiotics as you have been doing, about every 25-30 days Vaccines are up-to-date Follow with Dr Delton Coombes in 4 months or sooner if you have any problems

## 2020-10-08 NOTE — Progress Notes (Signed)
Subjective:    Patient ID: Tristan Le, male    DOB: 1941-11-21, 79 y.o.   MRN: [id number]  HPI  ROV 06/18/20 --79 year old man with history of hypertension, hyperlipidemia, GERD, DJD, COPD with mixed restriction and obstruction by pulmonary function testing.  At his last visit we continued his Yupelri, Pulmicort, tapered his prednisone back down to 5 mg daily and then started him on rotating antibiotics.  He reports that he has benefited, had some improvement in his mucous production. He has noticed also that he has had some trouble with the combination of mucinex and benadryl - was having trouble clearing the mucous, leading to dyspnea. He was recently told that he was going to have to start paying full cost for Yupelri >> not clear why this is. Will consult our clinical pharmacists to trouble shoot  ROV 10/08/20 --79 year old male with hypertension, hyperlipidemia, COPD with superimposed restrictive lung disease.  He has been managed on Yupelri, Pulmicort nebs.  He is on chronic prednisone 5 mg daily and rotating antibiotics (clarithromycin, doxycycline, cefuroxime).  He reports today that he is doing fairly well - he is taking the abx about every 25 days. Seem to help his mucous burden and thereby his breathing. He is using mucinex but sometimes forgets it. Functional capacity is stable, some days does quite well, others he is more limited. Clears mucous best in the am. He is using benadryl daily. Using albuterol nebs about 2x a day. He is on Yulpelri, but it is $250 a month, is concerned about the cost.  Vaccines up to date.    Review of Systems  Constitutional: Negative for fever and unexpected weight change.  HENT: Negative for congestion, dental problem, ear pain, nosebleeds, postnasal drip, rhinorrhea, sinus pressure, sneezing, sore throat and trouble swallowing.   Eyes: Negative for redness and itching.  Respiratory: Positive for cough, shortness of breath and wheezing. Negative for  chest tightness.   Cardiovascular: Negative for chest pain, palpitations and leg swelling.  Gastrointestinal: Negative for nausea and vomiting.  Genitourinary: Negative for dysuria.  Musculoskeletal: Negative for joint swelling.  Skin: Negative for rash.  Neurological: Negative for headaches.  Hematological: Does not bruise/bleed easily.  Psychiatric/Behavioral: Negative for dysphoric mood. The patient is not nervous/anxious.         Objective:   Physical Exam Vitals:   10/08/20 1406  BP: 120/70  Pulse: 77  Temp: 97.8 F (36.6 C)  TempSrc: Oral  SpO2: 98%  Weight: 220 lb 6.4 oz (100 kg)  Height: 6\' 2"  (1.88 m)   Gen: Pleasant, well-nourished, in no distress,  normal affect  ENT: No lesions,  mouth clear, edentulous, oropharynx clear, no postnasal drip  Neck: No JVD, no stridor  Lungs: No use of accessory muscles, no crackles, does wheeze at end expiration B  Cardiovascular: RRR, heart sounds normal, no murmur or gallops, no peripheral edema  Musculoskeletal: No deformities, no cyanosis or clubbing  Neuro: alert, awake, non focal  Skin: Warm, no lesions or rash     Assessment & Plan:  COPD (chronic obstructive pulmonary disease) (HCC) Severe disease with mucopurulent secretions. He is improved since we started scheduled monthly antibiotics. Plan to continue prednisone as ordered. He is concerned about the cost of Yupelri but we have been unable to come off of it, have found an adequate substitution. We could consider changing to a nonnebulized LAMA at some point going forward. I will revisit this with him next time.  Please continue Yupelri nebulizer once  daily Continue your budesonide nebulizer twice a day. Rinse and gargle after using this 1 Use your DuoNeb up to every 6 hours as needed for shortness of breath Keep your albuterol inhaler available to use 2 puffs up to every 4 hours if needed for shortness of breath Continue prednisone 5 mg daily Continue rotating  your antibiotics as you have been doing, about every 25-30 days Vaccines are up-to-date Follow with Dr Delton Coombes in 4 months or sooner if you have any problems  Levy Pupa, MD, PhD 10/08/2020, 2:46 PM  [facility] [phone] or if no answer [phone]

## 2020-10-08 NOTE — Addendum Note (Signed)
Addended by: Dorisann Frames R on: 10/08/2020 02:50 PM   Modules accepted: Orders

## 2020-10-08 NOTE — Patient Instructions (Signed)
Please continue Yupelri nebulizer once daily Continue your budesonide nebulizer twice a day. Rinse and gargle after using this 1 Use your DuoNeb up to every 6 hours as needed for shortness of breath Keep your albuterol inhaler available to use 2 puffs up to every 4 hours if needed for shortness of breath Continue prednisone 5 mg daily Continue rotating your antibiotics as you have been doing, about every 25-30 days Vaccines are up-to-date Follow with Dr Delton Coombes in 4 months or sooner if you have any problems.

## 2020-10-17 ENCOUNTER — Other Ambulatory Visit: Payer: Self-pay | Admitting: Primary Care

## 2020-10-22 ENCOUNTER — Telehealth: Payer: Self-pay | Admitting: Emergency Medicine

## 2020-10-22 MED ORDER — YUPELRI 175 MCG/3ML IN SOLN: RESPIRATORY_TRACT | 0 refills | Status: DC

## 2020-10-22 NOTE — Telephone Encounter (Signed)
[facility] calling to see if patient can get a prescription of the Ypuerli sent in to [facility] while awaiting for prescription to come in from [facility] [facility]. Contact [phone]. Please call patient back

## 2020-10-22 NOTE — Telephone Encounter (Signed)
Please refer to mychart message dated 10/24 as it was documented by leslie today 10/25 that she sent Rx for Yupelri to [facility] for pt. Nothing further needed.

## 2020-10-22 NOTE — Telephone Encounter (Signed)
Patient states he has already been charged by [facility], he will just wait it out as he believes it will be shipped today.  Advised him to let us know if he changes his mind.

## 2020-10-22 NOTE — Addendum Note (Signed)
Addended by: Christen Butter on: 10/22/2020 04:34 PM   Modules accepted: Orders

## 2020-10-23 ENCOUNTER — Telehealth: Payer: Self-pay | Admitting: Emergency Medicine

## 2020-10-23 NOTE — Telephone Encounter (Signed)
Per mychart message 10/24, pt stated that [facility] was showing that the Rx for Mikael Spray was going to arrive 10/27.  Called and spoke with pt who stated after Rx for Yupelri was sent to [facility], pt was told that the Rx was going to be $1300. Pt stated Rx should arrive from [facility] tomorrow. Stated to pt if he does not receive the Rx from [facility] tomorrow to call office and we could further investigate. Pt verbalized understanding. Nothing further needed.

## 2020-11-04 ENCOUNTER — Other Ambulatory Visit: Payer: Self-pay | Admitting: Primary Care

## 2020-11-10 ENCOUNTER — Other Ambulatory Visit: Payer: Self-pay | Admitting: Cardiovascular Disease

## 2020-11-28 ENCOUNTER — Telehealth: Payer: Self-pay | Admitting: *Deleted

## 2020-11-28 ENCOUNTER — Telehealth (INDEPENDENT_AMBULATORY_CARE_PROVIDER_SITE_OTHER): Payer: Medicare HMO | Admitting: Physician Assistant

## 2020-11-28 ENCOUNTER — Encounter: Payer: Self-pay | Admitting: Physician Assistant

## 2020-11-28 ENCOUNTER — Other Ambulatory Visit: Payer: Self-pay

## 2020-11-28 VITALS — BP 125/77 | HR 84 | Ht 73.0 in | Wt 213.0 lb

## 2020-11-28 DIAGNOSIS — I10 Essential (primary) hypertension: Secondary | ICD-10-CM | POA: Diagnosis not present

## 2020-11-28 DIAGNOSIS — I2510 Atherosclerotic heart disease of native coronary artery without angina pectoris: Secondary | ICD-10-CM | POA: Diagnosis not present

## 2020-11-28 DIAGNOSIS — I2584 Coronary atherosclerosis due to calcified coronary lesion: Secondary | ICD-10-CM

## 2020-11-28 DIAGNOSIS — E782 Mixed hyperlipidemia: Secondary | ICD-10-CM

## 2020-11-28 NOTE — Progress Notes (Signed)
Virtual Visit via Telephone Note   This visit type was conducted due to national recommendations for restrictions regarding the COVID-19 Pandemic (e.g. social distancing) in an effort to limit this patient's exposure and mitigate transmission in our community.  Due to his co-morbid illnesses, this patient is at least at moderate risk for complications without adequate follow up.  This format is felt to be most appropriate for this patient at this time.  The patient did not have access to video technology/had technical difficulties with video requiring transitioning to audio format only (telephone).  All issues noted in this document were discussed and addressed.  No physical exam could be performed with this format.  Please refer to the patient's chart for his  consent to telehealth for [facility].    Date:  11/28/2020   ID:  Tristan Le, DOB Apr 28, 1941, MRN [id number] The patient was identified using 2 identifiers.  Patient Location: Home Provider Location: Home Office  PCP:  Tristan Le, [place]  Cardiologist:  Tristan Miss, MD  Electrophysiologist:  None   Evaluation Performed:  Follow-Up Visit  Chief Complaint:  Follow-up (CAD)    Patient Profile: Tristan Le is a 79 y.o. 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 This was 77 percentile for age and sex matched control.  History of Present Illness:   Tristan Le was last seen in clinic by Tristan Passey, PA-C in 03/2020.  He has been followed for chronic stable shortness of breath.  Prior echocardiogram demonstrated normal LVF with mild diastolic dysfunction.  CT cardiac scoring demonstrated  a Ca2+ score of 1612.  Myoview was low risk.  He was placed on Diltiazem in the past due to sinus tachycardia.  Today, he notes he is doing well. He has not had chest pain, syncope, leg swelling.  He has chronic shortness of breath but this is overall stable.    Past Medical History:  Diagnosis Date  . Arthritis   . Chest pain   . COPD (chronic obstructive pulmonary disease) (HCC) 05/18/2019  . Cough 05/18/2019   06/13/2019-AFB-negative Fungal culture-showing yeast Respiratory sputum culture-negative   . GERD (gastroesophageal reflux disease)   . Hyperlipidemia   . Hypertension    History reviewed. No pertinent surgical history.   Current Meds  Medication Sig  . albuterol (VENTOLIN HFA) 108 (90 Base) MCG/ACT inhaler INHALE 1 TO 2 PUFFS INTO THE LUNGS DAILY AS NEEDED FOR SHORTNESS OF BREATH.  . budesonide (PULMICORT) 0.5 MG/2ML nebulizer solution USE 1 VIAL  IN  NEBULIZER TWICE  DAILY - rinse mouth after treatment  . diltiazem (DILTIAZEM CD) 240 MG 24 hr capsule Take 1 capsule (240 mg total) by mouth daily.  Marland Kitchen guaiFENesin (MUCINEX) 600 MG 12 hr tablet Take 1 tablet by mouth 2 (two) times daily.   . hydrochlorothiazide (MICROZIDE) 12.5 MG capsule Take 1 capsule (12.5 mg total) by mouth daily.  Tristan Le Ethyl (VASCEPA) 0.5 g CAPS Take 4 capsules by mouth 2 (two) times daily.  Marland Kitchen ipratropium-albuterol (DUONEB) 0.5-2.5 (3) MG/3ML SOLN USE 1 VIAL IN NEBULIZER EVERY 6 HOURS  . Omega-3 Fatty Acids (FISH OIL) 1000 MG CAPS Take 2 g by mouth daily.   Marland Kitchen  PRALUENT 150 MG/ML SOAJ INJECT 1 PEN INTO THE SKIN EVERY 14 DAYS  . predniSONE (DELTASONE) 5 MG tablet TAKE 1 TABLET EVERY DAY  . Respiratory Therapy Supplies (FLUTTER) DEVI Use as directed  . revefenacin (YUPELRI) 175 MCG/3ML nebulizer solution 1 vial in nebulizer daily  . triamcinolone cream (KENALOG) 0.1 % Apply twice day if needed to red itchy areas below the face.  Wait 10 minutes and then [place] Eucerin cream in the bad areas and lotion elsewhere.    Current Facility-Administered Medications for the 11/28/20 encounter (Telemedicine) with Tereso Newcomer T, PA-C  Medication  . 0.9 %  sodium chloride infusion     Allergies:   Penicillins and Sulfonamide derivatives   Social History   Tobacco Use  . Smoking status: Former Smoker    Packs/day: 0.50    Years: 60.00    Pack years: 30.00    Types: Cigarettes    Quit date: 04/27/2019    Years since quitting: 1.5  . Smokeless tobacco: Never Used  Vaping Use  . Vaping Use: Never used  Substance Use Topics  . Alcohol use: No  . Drug use: No     Family Hx: The patient's family history includes Heart disease in his brother and father; Hyperlipidemia in his brother.  ROS:   Please see the history of present illness.     Labs/Other Tests and Data Reviewed:    EKG:  No ECG reviewed.  Recent Labs: 12/07/2019: ALT 32   Recent Lipid Panel Lab Results  Component Value Date/Time   CHOL 149 12/07/2019 07:59 AM   TRIG 172 (H) 12/07/2019 07:59 AM   HDL 43 12/07/2019 07:59 AM   CHOLHDL 3.5 12/07/2019 07:59 AM   CHOLHDL 6.0 08/10/2010 01:57 AM   LDLCALC 77 12/07/2019 07:59 AM   LDLDIRECT 83 12/07/2019 07:59 AM   Labs obtained through Care Everywhere - personally reviewed and interpreted: 11/26/2020: Creatinine 1.05, K+ 4.6, ALT 23, total cholesterol 121, triglycerides 173, HDL 43, LDL 49  Wt Readings from Last 3 Encounters:  11/28/20 213 lb (96.6 kg)  10/08/20 220 lb 6.4 oz (100 kg)  06/18/20 224 lb 12.8 oz (102 kg)     Risk Assessment/Calculations:      Objective:    Vital Signs:  BP 125/77   Pulse 84   Ht 6\' 1"  (1.854 m)   Wt 213 lb (96.6 kg)   SpO2 97%   BMI 28.10 kg/m    VITAL SIGNS:  reviewed GEN:  no acute distress PSYCH:  normal affect  ASSESSMENT & PLAN:     1. Coronary artery calcification Echocardiogram in October 2020 with normal LV function.  Myoview in October 2020 low risk.  He is doing well without anginal symptoms.  He has declined aspirin  therapy in the past.  Continue Alirocumab.  2. Essential hypertension The patient's blood pressure is controlled on his current regimen.  Continue current therapy.   3. Mixed hyperlipidemia LDL optimal on most recent lab work.  Continue current Rx.  Continue to work on diet to manage triglycerides.     Time:   Today, I have spent 7.5 minutes with the patient with telehealth technology discussing the above problems.     Medication Adjustments/Labs and Tests Ordered: Current medicines are reviewed at length with the patient today.  Concerns regarding medicines are outlined above.   Tests Ordered: No orders of the defined types were placed in this encounter.   Medication Changes: No orders of the defined types were  placed in this encounter.   Follow Up:  In Person in 1 year(s)  Signed, Tereso Newcomer, PA-C  11/28/2020 4:41 PM    [facility]

## 2020-11-28 NOTE — Telephone Encounter (Signed)
  Patient Consent for Virtual Visit         Tristan Le has provided verbal consent on 11/28/2020 for a virtual visit (video or telephone).   CONSENT FOR VIRTUAL VISIT FOR:  Tristan Le  By participating in this virtual visit I agree to the following:  I hereby voluntarily request, consent and authorize [facility] and its employed or contracted physicians, physician assistants, nurse practitioners or other licensed health care professionals (the Practitioner), to provide me with telemedicine health care services (the "Services") as deemed necessary by the treating Practitioner. I acknowledge and consent to receive the Services by the Practitioner via telemedicine. I understand that the telemedicine visit will involve communicating with the Practitioner through live audiovisual communication technology and the disclosure of certain medical information by electronic transmission. I acknowledge that I have been given the opportunity to request an in-person assessment or other available alternative prior to the telemedicine visit and am voluntarily participating in the telemedicine visit.  I understand that I have the right to withhold or withdraw my consent to the use of telemedicine in the course of my care at any time, without affecting my right to future care or treatment, and that the Practitioner or I may terminate the telemedicine visit at any time. I understand that I have the right to inspect all information obtained and/or recorded in the course of the telemedicine visit and may receive copies of available information for a reasonable fee.  I understand that some of the potential risks of receiving the Services via telemedicine include:  Marland Kitchen Delay or interruption in medical evaluation due to technological equipment failure or disruption; . Information transmitted may not be sufficient (e.g. poor resolution of images) to allow for appropriate medical decision making by the Practitioner;  and/or  . In rare instances, security protocols could fail, causing a breach of personal health information.  Furthermore, I acknowledge that it is my responsibility to provide information about my medical history, conditions and care that is complete and accurate to the best of my ability. I acknowledge that Practitioner's advice, recommendations, and/or decision may be based on factors not within their control, such as incomplete or inaccurate data provided by me or distortions of diagnostic images or specimens that may result from electronic transmissions. I understand that the practice of medicine is not an exact science and that Practitioner makes no warranties or guarantees regarding treatment outcomes. I acknowledge that a copy of this consent can be made available to me via my patient portal [facility] MyChart), or I can request a printed copy by calling the office of [facility].    I understand that my insurance will be billed for this visit.   I have read or had this consent read to me. . I understand the contents of this consent, which adequately explains the benefits and risks of the Services being provided via telemedicine.  . I have been provided ample opportunity to ask questions regarding this consent and the Services and have had my questions answered to my satisfaction. . I give my informed consent for the services to be provided through the use of telemedicine in my medical care

## 2020-11-28 NOTE — Patient Instructions (Signed)
Medication Instructions:  Your physician recommends that you continue on your current medications as directed. Please refer to the Current Medication list given to you today.  *If you need a refill on your cardiac medications before your next appointment, please call your pharmacy*  Lab Work: None ordered today  Testing/Procedures: None ordered today  Follow-Up: At [facility], you and your health needs are our priority.  As part of our continuing mission to provide you with exceptional heart care, we have created designated Provider Care Teams.  These Care Teams include your primary Cardiologist (physician) and Advanced Practice Providers (APPs -  Physician Assistants and Nurse Practitioners) who all work together to provide you with the care you need, when you need it.  Your next appointment:   12 month(s)  The format for your next appointment:   In Person  Provider:   Philip Nahser, MD   

## 2020-12-04 ENCOUNTER — Telehealth: Payer: Self-pay | Admitting: Pharmacist

## 2020-12-04 MED ORDER — REPATHA SURECLICK 140 MG/ML ~~LOC~~ SOAJ: 1.0000 "pen " | SUBCUTANEOUS | 11 refills | Status: DC

## 2020-12-04 NOTE — Telephone Encounter (Signed)
Patient made aware that insurance prefers Repatha. PA for Repatha approved. Rx sent to pharmacy. Patient states understanding. Labs from care-everywhere look good

## 2021-02-07 ENCOUNTER — Other Ambulatory Visit: Payer: Self-pay | Admitting: Emergency Medicine

## 2021-03-12 ENCOUNTER — Other Ambulatory Visit: Payer: Self-pay | Admitting: Cardiovascular Disease

## 2021-04-06 ENCOUNTER — Other Ambulatory Visit: Payer: Self-pay | Admitting: Primary Care

## 2021-04-12 ENCOUNTER — Other Ambulatory Visit: Payer: Self-pay | Admitting: Pulmonary Disease

## 2021-04-12 DIAGNOSIS — J410 Simple chronic bronchitis: Secondary | ICD-10-CM

## 2021-05-21 ENCOUNTER — Other Ambulatory Visit: Payer: Self-pay | Admitting: Pharmacist

## 2021-05-21 MED ORDER — REPATHA SURECLICK 140 MG/ML ~~LOC~~ SOAJ
1.0000 | SUBCUTANEOUS | 3 refills | Status: DC
Start: 2021-05-21 — End: 2022-02-17

## 2021-06-12 ENCOUNTER — Other Ambulatory Visit: Payer: Self-pay | Admitting: Emergency Medicine

## 2021-06-20 ENCOUNTER — Other Ambulatory Visit: Payer: Self-pay | Admitting: Emergency Medicine

## 2021-06-20 NOTE — Telephone Encounter (Signed)
Pt is requesting predniSONE (DELTASONE) 5 MG tablet. Would you like to send this in?

## 2021-08-28 ENCOUNTER — Other Ambulatory Visit: Payer: Self-pay

## 2021-08-28 ENCOUNTER — Ambulatory Visit: Payer: Medicare HMO | Admitting: Emergency Medicine

## 2021-08-28 ENCOUNTER — Encounter: Payer: Self-pay | Admitting: Emergency Medicine

## 2021-08-28 DIAGNOSIS — J438 Other emphysema: Secondary | ICD-10-CM | POA: Diagnosis not present

## 2021-08-28 MED ORDER — CLARITHROMYCIN 250 MG PO TABS: 250.0000 mg | ORAL_TABLET | Freq: Two times a day (BID) | ORAL | 3 refills | Status: AC

## 2021-08-28 MED ORDER — DOXYCYCLINE HYCLATE 100 MG PO TABS: 100.0000 mg | ORAL_TABLET | Freq: Two times a day (BID) | ORAL | 3 refills | Status: AC

## 2021-08-28 MED ORDER — CEFUROXIME AXETIL 250 MG PO TABS: 250.0000 mg | ORAL_TABLET | Freq: Two times a day (BID) | ORAL | 3 refills | Status: AC

## 2021-08-28 MED ORDER — BREZTRI AEROSPHERE 160-9-4.8 MCG/ACT IN AERO: 2.0000 | INHALATION_SPRAY | Freq: Two times a day (BID) | RESPIRATORY_TRACT | Status: DC

## 2021-08-28 NOTE — Progress Notes (Signed)
Subjective:    Patient ID: Tristan Le, male    DOB: [birth year], 80 y.o.   MRN: [id number]  HPI  ROV 10/08/20 --80 year old male with hypertension, hyperlipidemia, COPD with superimposed restrictive lung disease.  He has been managed on Yupelri, Pulmicort nebs.  He is on chronic prednisone 5 mg daily and rotating antibiotics (clarithromycin, doxycycline, cefuroxime).  He reports today that he is doing fairly well - he is taking the abx about every 25 days. Seem to help his mucous burden and thereby his breathing. He is using mucinex but sometimes forgets it. Functional capacity is stable, some days does quite well, others he is more limited. Clears mucous best in the am. He is using benadryl daily. Using albuterol nebs about 2x a day. He is on Yulpelri, but it is $250 a month, is concerned about the cost.  Vaccines up to date.   ROV 08/28/21 --this follow-up visit 80 year old man with a history of mixed restriction and obstruction on pulmonary function testing, COPD plus obesity, deconditioning.  He has been managed with chronic prednisone 5 mg daily, is on rotating antibiotics (clarithromycin, doxycycline, cefuroxime).  He is on Yupelri and Pulmicort nebs.  He uses albuterol nebs approximately.  The cost of his Mikael Spray is > $250/mo. He just ran out of his rotating abx.   He feels pretty good, is able to walk, has SOB when he carries objects, has to exert. Some daily cough but not much sputum. No flares since last time.    Review of Systems  Constitutional:  Negative for fever and unexpected weight change.  HENT:  Negative for congestion, dental problem, ear pain, nosebleeds, postnasal drip, rhinorrhea, sinus pressure, sneezing, sore throat and trouble swallowing.   Eyes:  Negative for redness and itching.  Respiratory:  Positive for cough, shortness of breath and wheezing. Negative for chest tightness.   Cardiovascular:  Negative for chest pain, palpitations and leg swelling.   Gastrointestinal:  Negative for nausea and vomiting.  Genitourinary:  Negative for dysuria.  Musculoskeletal:  Negative for joint swelling.  Skin:  Negative for rash.  Neurological:  Negative for headaches.  Hematological:  Does not bruise/bleed easily.  Psychiatric/Behavioral:  Negative for dysphoric mood. The patient is not nervous/anxious.        Objective:   Physical Exam Vitals:   08/28/21 1534  BP: 128/78  Pulse: 73  Temp: 97.8 F (36.6 C)  TempSrc: Oral  SpO2: 96%  Weight: 205 lb 12.8 oz (93.4 kg)  Height: 6\' 1"  (1.854 m)    Gen: Pleasant, well-nourished, in no distress,  normal affect  ENT: No lesions,  mouth clear, edentulous, oropharynx clear, no postnasal drip  Neck: No JVD, no stridor  Lungs: No use of accessory muscles, rhonchi on the right.  Some end expiratory wheezing bilaterally  Cardiovascular: RRR, heart sounds normal, no murmur or gallops, no peripheral edema  Musculoskeletal: No deformities, no cyanosis or clubbing  Neuro: alert, awake, non focal  Skin: Warm, no lesions or rash     Assessment & Plan:  COPD (chronic obstructive pulmonary disease) (HCC) He has been on Yupelri with budesonide for the last 2 years.  Some difficulty in the past and we try to withdraw it or change it.  The regimen is costly.  He is better now, possibly because we have also established him on a stable regimen of daily prednisone as well as rotating antibiotic.  I think it is worth trying to get him on a more affordable regimen,  we will try Breztri to see if he tolerates, benefits.  He will use a spacer since he is had cough with HFA in the past.  Try temporarily holding your Yupelri and budesonide nebulizer treatments. Try starting Breztri 2 puffs twice a day through a spacer.  Rinse and gargle after using.  If you benefit from this change then please call us to let us know so we can order the breast tree through your pharmacy. Keep your albuterol available use either 2  puffs or 1 nebulizer treatment up to every 4 hours if needed for shortness of breath, chest tightness, wheezing. Continue prednisone 5 mg once daily. Continue to use your rotating antibiotics for the first week of each alternating month: -Doxycycline -Cefuroxime -Clarithromycin Follow with Dr Delton Coombes in 6 months or sooner if you have any problems  Levy Pupa, MD, PhD 08/28/2021, 4:00 PM [facility] [phone] or if no answer [phone]

## 2021-08-28 NOTE — Patient Instructions (Addendum)
Try temporarily holding your Yupelri and budesonide nebulizer treatments. Try starting Breztri 2 puffs twice a day through a spacer.  Rinse and gargle after using.  If you benefit from this change then please call us to let us know so we can order the breast tree through your pharmacy. Keep your albuterol available use either 2 puffs or 1 nebulizer treatment up to every 4 hours if needed for shortness of breath, chest tightness, wheezing. Continue prednisone 5 mg once daily. Continue to use your rotating antibiotics for the first week of each alternating month: -Doxycycline -Cefuroxime -Clarithromycin Follow with Dr Delton Coombes in 6 months or sooner if you have any problems

## 2021-08-28 NOTE — Assessment & Plan Note (Signed)
He has been on Yupelri with budesonide for the last 2 years.  Some difficulty in the past and we try to withdraw it or change it.  The regimen is costly.  He is better now, possibly because we have also established him on a stable regimen of daily prednisone as well as rotating antibiotic.  I think it is worth trying to get him on a more affordable regimen, we will try Breztri to see if he tolerates, benefits.  He will use a spacer since he is had cough with HFA in the past.  Try temporarily holding your Yupelri and budesonide nebulizer treatments. Try starting Breztri 2 puffs twice a day through a spacer.  Rinse and gargle after using.  If you benefit from this change then please call us to let us know so we can order the breast tree through your pharmacy. Keep your albuterol available use either 2 puffs or 1 nebulizer treatment up to every 4 hours if needed for shortness of breath, chest tightness, wheezing. Continue prednisone 5 mg once daily. Continue to use your rotating antibiotics for the first week of each alternating month: -Doxycycline -Cefuroxime -Clarithromycin Follow with Dr Delton Coombes in 6 months or sooner if you have any problems

## 2021-08-29 ENCOUNTER — Other Ambulatory Visit: Payer: Self-pay

## 2021-08-30 ENCOUNTER — Other Ambulatory Visit: Payer: Self-pay | Admitting: Cardiovascular Disease

## 2021-09-03 ENCOUNTER — Telehealth: Payer: Self-pay | Admitting: Emergency Medicine

## 2021-09-03 MED ORDER — BREZTRI AEROSPHERE 160-9-4.8 MCG/ACT IN AERO: 2.0000 | INHALATION_SPRAY | Freq: Two times a day (BID) | RESPIRATORY_TRACT | 6 refills | Status: AC

## 2021-09-03 NOTE — Telephone Encounter (Signed)
Atc patient unable to reach RX sent it per Dr. Kavin Leech last office visit note

## 2021-09-05 ENCOUNTER — Telehealth: Payer: Self-pay | Admitting: Emergency Medicine

## 2021-09-05 MED ORDER — PREDNISONE 5 MG PO TABS: 5.0000 mg | ORAL_TABLET | Freq: Every day | ORAL | 3 refills | Status: DC

## 2021-09-05 NOTE — Telephone Encounter (Signed)
Rx for pt's prednisone has been sent to preferred pharmacy for pt. Called and spoke with pt letting him know this had been done and pt verbalized understanding. Nothing further needed.

## 2021-11-04 NOTE — Telephone Encounter (Signed)
Dr. Delton Coombes I did not see Duonebs addressed in the last OV note. Would you like [place] to send refill?

## 2021-11-05 NOTE — Telephone Encounter (Signed)
I am ok refilling DuoNeb for him.

## 2021-11-06 MED ORDER — IPRATROPIUM-ALBUTEROL 0.5-2.5 (3) MG/3ML IN SOLN: 3.0000 mL | Freq: Four times a day (QID) | RESPIRATORY_TRACT | 5 refills | Status: DC | PRN

## 2021-11-24 ENCOUNTER — Encounter: Payer: Self-pay | Admitting: Cardiovascular Disease

## 2021-11-24 NOTE — Progress Notes (Signed)
Cardiology Office Note:    Date:  11/25/2021   ID:  [name], DOB Jun 03, 1941, MRN [id number]  PCP:  Tristan Bussing, PA  Cardiologist:  Ngai Parcell  Electrophysiologist:  None   Referring MD: Tristan Le, [place]   Chief Complaint  Patient presents with   Shortness of Breath         History of Present Illness:    Tristan Le is a 80 y.o. male with a hx of   progressive dyspnea.   Thought to be due to COPD but PFTs are normal .   We were asked to see him today by Dr. Lamonte Sakai for further evaluation of the shortness of breath.  Dyspnea started 6-7 months ago .  He quit smoking when he became so short of breath. No episodes of chest pain or chest pressure.  He does not get any regular exercise. No yard work .   Now gets fatigued with walking to his car   Has never really been one to exercise,  Was active.   PFT's in Aug.  2020 were normal .   Wife has dementia - he is here primarily to ensure that he is healthy enough to take care of his wife.   Has not slept well for the past 3 months (corresponds to acute worsening of his wife's dementia  Gets 2-3 hours of sleep at night, then tries to get a nap .   Nov. 28, 2022 Tristan Le is seen today for follow up of his dyspnea Echocardiogram from October, 2020 reveals normal left ventricular systolic function.  He has grade 1 diastolic dysfunction. 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 Myoview study on October 11, 2019 with low risk.  There is no evidence of ischemia.  He had normal left ventricular systolic function.  His wife has worsening /progressive dementia His one hope is that he can stay alive long enough so that he can care for her .  Has been exercising more.   Walks in the house  Not as much recently due to his wife's progressive decline .  Labs from care everywhere show labs from his primary medical doctor. [facility]: Cholesterol level is 115 Triglyceride level is 91 HDL is 53 LDL  is 45 We have started him on Repatha  Ok to refill   Past Medical History:  Diagnosis Date   Arthritis    Chest pain    COPD (chronic obstructive pulmonary disease) ([place]) 05/18/2019   Cough 05/18/2019   06/13/2019-AFB-negative Fungal culture-showing yeast Respiratory sputum culture-negative    GERD (gastroesophageal reflux disease)    Hyperlipidemia    Hypertension     History reviewed. No pertinent surgical history.  Current Medications: Current Meds  Medication Sig   albuterol (VENTOLIN HFA) 108 (90 Base) MCG/ACT inhaler INHALE 1 TO 2 PUFFS INTO THE LUNGS DAILY AS NEEDED FOR SHORTNESS OF BREATH.   Budeson-Glycopyrrol-Formoterol (BREZTRI AEROSPHERE) 160-9-4.8 MCG/ACT AERO    cefUROXime (CEFTIN) 250 MG tablet As directed by pulmonology on rotating months.   clarithromycin (BIAXIN) 250 MG tablet As directed by pulmonology on rotating months   diltiazem (CARDIZEM CD) 240 MG 24 hr capsule TAKE 1 CAPSULE (240 MG TOTAL) BY MOUTH DAILY.   doxycycline (VIBRA-TABS) 100 MG tablet As directed by pulmonology on rotating months   Evolocumab (REPATHA SURECLICK) XX123456 MG/ML SOAJ Inject 1 pen into the skin every 14 (fourteen) days.   guaiFENesin (MUCINEX) 600 MG 12 hr tablet Take 1 tablet by mouth  2 (two) times daily.    hydrochlorothiazide (MICROZIDE) 12.5 MG capsule Take 1 capsule (12.5 mg total) by mouth daily.   Icosapent Ethyl (VASCEPA) 0.5 g CAPS Take 4 capsules by mouth 2 (two) times daily.   ipratropium-albuterol (DUONEB) 0.5-2.5 (3) MG/3ML SOLN USE 1 VIAL IN NEBULIZER EVERY 6 HOURS   ipratropium-albuterol (DUONEB) 0.5-2.5 (3) MG/3ML SOLN Take 3 mLs by nebulization every 6 (six) hours as needed.   Omega-3 Fatty Acids (FISH OIL) 1000 MG CAPS Take 2 g by mouth daily.    predniSONE (DELTASONE) 5 MG tablet Take 1 tablet (5 mg total) by mouth daily.   Respiratory Therapy Supplies (FLUTTER) DEVI Use as directed   triamcinolone cream (KENALOG) 0.1 % Apply twice day if needed to red itchy areas below  the face.  Wait 10 minutes and then us Eucerin cream in the bad areas and lotion elsewhere.   Current Facility-Administered Medications for the 11/25/21 encounter (Office Visit) with Nzinga Ferran, Deloris PingPhilip J, MD  Medication   0.9 %  sodium chloride infusion     Allergies:   Penicillins and Sulfonamide derivatives   Social History   Socioeconomic History   Marital status: Married    Spouse name: Not on file   Number of children: 4   Years of education: Not on file   Highest education level: Not on file  Occupational History   Occupation: Retired  Tobacco Use   Smoking status: Former    Packs/day: 0.50    Years: 60.00    Pack years: 30.00    Types: Cigarettes    Quit date: 04/27/2019    Years since quitting: 2.5   Smokeless tobacco: Never  Vaping Use   Vaping Use: Never used  Substance and Sexual Activity   Alcohol use: No   Drug use: No   Sexual activity: Not on file  Other Topics Concern   Not on file  Social History Narrative   Not on file   Social Determinants of Health   Financial Resource Strain: Not on file  Food Insecurity: Not on file  Transportation Needs: Not on file  Physical Activity: Not on file  Stress: Not on file  Social Connections: Not on file     Family History: The patient's family history includes Heart disease in his brother and father; Hyperlipidemia in his brother.  ROS:   Please see the history of present illness.     All other systems reviewed and are negative.  EKGs/Labs/Other Studies Reviewed:    The following studies were reviewed today:   EKG:   November 25, 2021: Normal sinus rhythm.  No ST or T wave changes.  Recent Labs: No results found for requested labs within last 8760 hours.  Recent Lipid Panel    Component Value Date/Time   CHOL 149 12/07/2019 0759   TRIG 172 (H) 12/07/2019 0759   HDL 43 12/07/2019 0759   CHOLHDL 3.5 12/07/2019 0759   CHOLHDL 6.0 08/10/2010 0157   VLDL 42 (H) 08/10/2010 0157   LDLCALC 77 12/07/2019  0759   LDLDIRECT 83 12/07/2019 0759    Physical Exam:    Physical Exam: Blood pressure 128/68, pulse 71, height 6\' 1"  (1.854 m), weight 202 lb (91.6 kg), SpO2 98 %.  GEN:  Well nourished, well developed in no acute distress HEENT: Normal NECK: No JVD; No carotid bruits LYMPHATICS: No lymphadenopathy CARDIAC: RRR , no murmurs, rubs, gallops RESPIRATORY:  Clear to auscultation without rales, wheezing or rhonchi  ABDOMEN: Soft, non-tender, non-distended MUSCULOSKELETAL:  No edema; No deformity  SKIN: Warm and dry NEUROLOGIC:  Alert and oriented x 3   ASSESSMENT:    1. Mixed hyperlipidemia   2. DOE (dyspnea on exertion)     PLAN:       Shortness of breath Shortness of breath seems to be improving.  Over the past year he has been able to walk as much is 5 to 7 miles in his house.  More recently his labs condition has worsened slightly and he has not been able to walk as much. He has not had any chest pain.  2.  Hyperlipidemia: His lipids are well controlled.  Was able to get labs from care everywhere.  Continue Repatha and current medications.   Medication Adjustments/Labs and Tests Ordered: Current medicines are reviewed at length with the patient today.  Concerns regarding medicines are outlined above.  Orders Placed This Encounter  Procedures   EKG 12-Lead    No orders of the defined types were placed in this encounter.    Patient Instructions  Medication Instructions:  Your physician recommends that you continue on your current medications as directed. Please refer to the Current Medication list given to you today.  *If you need a refill on your cardiac medications before your next appointment, please call your pharmacy*   Lab Work: none If you have labs (blood work) drawn today and your tests are completely normal, you will receive your results only by: MyChart Message (if you have MyChart) OR A paper copy in the mail If you have any lab test that is  abnormal or we need to change your treatment, we will call you to review the results.   Testing/Procedures: none   Follow-Up: At [facility], you and your health needs are our priority.  As part of our continuing mission to provide you with exceptional heart care, we have created designated Provider Care Teams.  These Care Teams include your primary Cardiologist (physician) and Advanced Practice Providers (APPs -  Physician Assistants and Nurse Practitioners) who all work together to provide you with the care you need, when you need it.  We recommend signing up for the patient portal called "MyChart".  Sign up information is provided on this After Visit Summary.  MyChart is used to connect with patients for Virtual Visits (Telemedicine).  Patients are able to view lab/test results, encounter notes, upcoming appointments, etc.  Non-urgent messages can be sent to your provider as well.   To learn more about what you can do with MyChart, go to [email or web address].    Your next appointment:   12 month(s)  The format for your next appointment:   In Person  Provider:   Kristeen Miss, MD  or Chelsea Aus, PA-C, Ronie Spies, PA-C, Nada Boozer, NP, Jacolyn Reedy, PA-C, Eligha Bridegroom, NP, or Tereso Newcomer, PA-C      If MD is not listed, click here to update    :1}    Other Instructions    Signed, Kristeen Miss, MD  11/25/2021 1:56 PM    [facility]

## 2021-11-25 ENCOUNTER — Other Ambulatory Visit: Payer: Self-pay

## 2021-11-25 ENCOUNTER — Encounter: Payer: Self-pay | Admitting: Cardiovascular Disease

## 2021-11-25 ENCOUNTER — Ambulatory Visit: Payer: Medicare HMO | Admitting: Cardiovascular Disease

## 2021-11-25 VITALS — BP 128/68 | HR 71 | Ht 73.0 in | Wt 202.0 lb

## 2021-11-25 DIAGNOSIS — R0609 Other forms of dyspnea: Secondary | ICD-10-CM | POA: Diagnosis not present

## 2021-11-25 DIAGNOSIS — E782 Mixed hyperlipidemia: Secondary | ICD-10-CM | POA: Diagnosis not present

## 2021-11-25 NOTE — Patient Instructions (Signed)
Medication Instructions:  Your physician recommends that you continue on your current medications as directed. Please refer to the Current Medication list given to you today.  *If you need a refill on your cardiac medications before your next appointment, please call your pharmacy*   Lab Work: none If you have labs (blood work) drawn today and your tests are completely normal, you will receive your results only by: MyChart Message (if you have MyChart) OR A paper copy in the mail If you have any lab test that is abnormal or we need to change your treatment, we will call you to review the results.   Testing/Procedures: none   Follow-Up: At [facility], you and your health needs are our priority.  As part of our continuing mission to provide you with exceptional heart care, we have created designated Provider Care Teams.  These Care Teams include your primary Cardiologist (physician) and Advanced Practice Providers (APPs -  Physician Assistants and Nurse Practitioners) who all work together to provide you with the care you need, when you need it.  We recommend signing up for the patient portal called "MyChart".  Sign up information is provided on this After Visit Summary.  MyChart is used to connect with patients for Virtual Visits (Telemedicine).  Patients are able to view lab/test results, encounter notes, upcoming appointments, etc.  Non-urgent messages can be sent to your provider as well.   To learn more about what you can do with MyChart, go to [email or web address].    Your next appointment:   12 month(s)  The format for your next appointment:   In Person  Provider:   Philip Nahser, MD  or Vin Bhagat, PA-C, Dayna Dunn, PA-C, Laura Ingold, NP, Michele Lenze, PA-C, Michelle Swinyer, NP, or Scott Weaver, PA-C      If MD is not listed, click here to update    :1}    Other Instructions    

## 2021-12-29 ENCOUNTER — Emergency Department (HOSPITAL_COMMUNITY)
Admission: EM | Admit: 2021-12-29 | Discharge: 2021-12-29 | Disposition: A | Discharge status: Home / Self Care | Payer: Medicare HMO | Attending: Emergency Medicine | Admitting: Emergency Medicine

## 2021-12-29 ENCOUNTER — Other Ambulatory Visit: Payer: Self-pay

## 2021-12-29 DIAGNOSIS — I10 Essential (primary) hypertension: Secondary | ICD-10-CM | POA: Insufficient documentation

## 2021-12-29 DIAGNOSIS — T782XXA Anaphylactic shock, unspecified, initial encounter: Secondary | ICD-10-CM | POA: Diagnosis not present

## 2021-12-29 DIAGNOSIS — T7840XA Allergy, unspecified, initial encounter: Secondary | ICD-10-CM | POA: Diagnosis present

## 2021-12-29 DIAGNOSIS — J449 Chronic obstructive pulmonary disease, unspecified: Secondary | ICD-10-CM | POA: Insufficient documentation

## 2021-12-29 LAB — CBC WITH DIFFERENTIAL/PLATELET
Abs Immature Granulocytes: 0.09 10*3/uL — ABNORMAL HIGH (ref 0.00–0.07)
Basophils Absolute: 0 10*3/uL (ref 0.0–0.1)
Basophils Relative: 0 %
Eosinophils Absolute: 0 10*3/uL (ref 0.0–0.5)
Eosinophils Relative: 0 %
HCT: 47.1 % (ref 39.0–52.0)
Hemoglobin: 15.8 g/dL (ref 13.0–17.0)
Immature Granulocytes: 1 %
Lymphocytes Relative: 5 %
Lymphs Abs: 1 10*3/uL (ref 0.7–4.0)
MCH: 35.7 pg — ABNORMAL HIGH (ref 26.0–34.0)
MCHC: 33.5 g/dL (ref 30.0–36.0)
MCV: 106.3 fL — ABNORMAL HIGH (ref 80.0–100.0)
Monocytes Absolute: 0.5 10*3/uL (ref 0.1–1.0)
Monocytes Relative: 3 %
Neutro Abs: 17.4 10*3/uL — ABNORMAL HIGH (ref 1.7–7.7)
Neutrophils Relative %: 91 %
Platelets: 170 10*3/uL (ref 150–400)
RBC: 4.43 MIL/uL (ref 4.22–5.81)
RDW: 12.3 % (ref 11.5–15.5)
WBC: 19 10*3/uL — ABNORMAL HIGH (ref 4.0–10.5)
nRBC: 0 % (ref 0.0–0.2)

## 2021-12-29 LAB — BASIC METABOLIC PANEL
Anion gap: 10 (ref 5–15)
BUN: 20 mg/dL (ref 8–23)
CO2: 20 mmol/L — ABNORMAL LOW (ref 22–32)
Calcium: 8.4 mg/dL — ABNORMAL LOW (ref 8.9–10.3)
Chloride: 106 mmol/L (ref 98–111)
Creatinine, Ser: 0.89 mg/dL (ref 0.61–1.24)
GFR, Estimated: >60 mL/min (ref >60)
Glucose, Bld: 129 mg/dL — ABNORMAL HIGH (ref 70–99)
Potassium: 3.6 mmol/L (ref 3.5–5.1)
Sodium: 136 mmol/L (ref 135–145)

## 2021-12-29 MED ORDER — SODIUM CHLORIDE 0.9 % IV BOLUS
1000.0000 mL | Freq: Once | INTRAVENOUS | Status: AC
  Administered 2021-12-29: 1000 mL via INTRAVENOUS

## 2021-12-29 MED ORDER — FAMOTIDINE IN NACL 20-0.9 MG/50ML-% IV SOLN
20.0000 mg | Freq: Once | INTRAVENOUS | Status: AC
  Administered 2021-12-29: 20 mg via INTRAVENOUS
  Filled 2021-12-29: qty 50

## 2021-12-29 MED ORDER — PREDNISONE 50 MG PO TABS: 50.0000 mg | ORAL_TABLET | Freq: Every day | ORAL | 0 refills | Status: AC

## 2021-12-29 MED ORDER — METHYLPREDNISOLONE SODIUM SUCC 125 MG IJ SOLR
125.0000 mg | Freq: Once | INTRAMUSCULAR | Status: AC
Start: 2021-12-29 — End: 2021-12-29
  Administered 2021-12-29: 125 mg via INTRAVENOUS
  Filled 2021-12-29: qty 2

## 2021-12-29 MED ORDER — EPINEPHRINE 0.3 MG/0.3ML IJ SOAJ: 0.3000 mg | INTRAMUSCULAR | 0 refills | Status: AC | PRN

## 2021-12-29 NOTE — ED Triage Notes (Signed)
Pt from home via EMS-pt took first dose of Cefuroxime today at 0700 and shortly thereafter developed fully body itching. So he took PO benadryl at home and called EMS. Initial BP 90/50 so was given 0.3 epi IM, 50 mg IV benadryl and 500 NS bolus. Denies sob.

## 2021-12-29 NOTE — Discharge Instructions (Signed)
Your history, exam, work-up today are consistent with anaphylactic reaction likely to the cefuroxime you took this morning.  Please stop taking it and call your PCP to discuss if you need to be on another preventative medication.  Please take the steroids for the next 5 days starting tomorrow as you received today's dose already.  Please fill the prescription for epinephrine in case need to use it again.  Please use Benadryl as directed for any itching that recurs.  Please rest and stay hydrated.  You were stable for over 4 and half hours since the dose and had continued improvement in symptoms.  We feel you are safe for discharge home but if any symptoms are to change or worsen, please return to the nearest emergency department.

## 2021-12-29 NOTE — ED Provider Notes (Signed)
[facility] EMERGENCY DEPARTMENT Provider Note   CSN: [id number] Arrival date & time: 12/29/21  1035     History  Chief Complaint  Patient presents with   Allergic Reaction    Tristan Le is a 81 y.o. male.  The history is provided by the patient and medical records. No language interpreter was used.  Allergic Reaction Presenting symptoms: itching and rash   Presenting symptoms: no difficulty breathing, no difficulty swallowing and no wheezing   Severity:  Severe Duration:  2 hours Prior allergic episodes:  No prior episodes Context: medications   Relieved by:  Epinephrine and antihistamines Worsened by:  Nothing Ineffective treatments:  None tried     Home Medications Prior to Admission medications   Medication Sig Start Date End Date Taking? Authorizing Provider  albuterol (VENTOLIN HFA) 108 (90 Base) MCG/ACT inhaler INHALE 1 TO 2 PUFFS INTO THE LUNGS DAILY AS NEEDED FOR SHORTNESS OF BREATH. 04/13/21   Collene Gobble, MD  Budeson-Glycopyrrol-Formoterol (BREZTRI AEROSPHERE) 160-9-4.8 MCG/ACT AERO  09/04/21   [provider]  budesonide (PULMICORT) 0.5 MG/2ML nebulizer solution USE 1 VIAL  IN  NEBULIZER TWICE  DAILY - rinse mouth after treatment Patient not taking: Reported on 11/25/2021 10/08/20   Collene Gobble, MD  cefUROXime (CEFTIN) 250 MG tablet As directed by pulmonology on rotating months. 09/30/21   [provider]  clarithromycin (BIAXIN) 250 MG tablet As directed by pulmonology on rotating months 09/30/21   [provider]  diltiazem (CARDIZEM CD) 240 MG 24 hr capsule TAKE 1 CAPSULE (240 MG TOTAL) BY MOUTH DAILY. 03/12/21   Nahser, Wonda Cheng, MD  doxycycline (VIBRA-TABS) 100 MG tablet As directed by pulmonology on rotating months 09/30/21   [provider]  Evolocumab (REPATHA SURECLICK) XX123456 MG/ML SOAJ Inject 1 pen into the skin every 14 (fourteen) days. 05/21/21   Nahser, Wonda Cheng, MD  guaiFENesin (MUCINEX) 600 MG  12 hr tablet Take 1 tablet by mouth 2 (two) times daily.     [provider]  hydrochlorothiazide (MICROZIDE) 12.5 MG capsule Take 1 capsule (12.5 mg total) by mouth daily. 09/13/19   Collene Gobble, MD  Icosapent Ethyl (VASCEPA) 0.5 g CAPS Take 4 capsules by mouth 2 (two) times daily. 08/01/20   Nahser, Wonda Cheng, MD  ipratropium-albuterol (DUONEB) 0.5-2.5 (3) MG/3ML SOLN USE 1 VIAL IN NEBULIZER EVERY 6 HOURS 10/08/20   Byrum, Rose Fillers, MD  ipratropium-albuterol (DUONEB) 0.5-2.5 (3) MG/3ML SOLN Take 3 mLs by nebulization every 6 (six) hours as needed. 11/06/21   Collene Gobble, MD  Omega-3 Fatty Acids (FISH OIL) 1000 MG CAPS Take 2 g by mouth daily.  12/26/19   [provider]  predniSONE (DELTASONE) 5 MG tablet Take 1 tablet (5 mg total) by mouth daily. 09/05/21   Collene Gobble, MD  Respiratory Therapy Supplies (FLUTTER) DEVI Use as directed 06/22/19   Collene Gobble, MD  triamcinolone cream (KENALOG) 0.1 % Apply twice day if needed to red itchy areas below the face.  Wait 10 minutes and then [place] Eucerin cream in the bad areas and lotion elsewhere. 08/23/20   Charlies Silvers, MD  YUPELRI 175 MCG/3ML nebulizer solution INHALE THE CONTENTS OF 1 VIAL VIA NEBULIZER EVERY DAY Patient not taking: Reported on 11/25/2021 06/13/21   Collene Gobble, MD      Allergies    Cefuroxime, Penicillins, and Sulfonamide derivatives    Review of Systems   Review of Systems  Constitutional:  Negative for chills, fatigue and fever.  HENT:  Negative for congestion and trouble swallowing.   Eyes:  Negative for visual disturbance.  Respiratory:  Negative for cough, chest tightness, shortness of breath and wheezing.   Cardiovascular:  Negative for chest pain and palpitations.  Gastrointestinal:  Negative for abdominal pain.  Genitourinary:  Negative for dysuria.  Musculoskeletal:  Negative for back pain, neck pain and neck stiffness.  Skin:  Positive for itching and rash. Negative for wound.   Neurological:  Positive for light-headedness (near syncope). Negative for syncope.  Psychiatric/Behavioral:  Negative for agitation.   All other systems reviewed and are negative.  Physical Exam Updated Vital Signs BP 111/63 (BP Location: Right Arm)    Pulse 68    Temp (!) 97.5 F (36.4 C) (Oral)    Resp 16    Ht 6\' 1"  (1.854 m)    Wt 89.4 kg    SpO2 99%    BMI 25.99 kg/m  Physical Exam Vitals and nursing note reviewed.  Constitutional:      General: He is not in acute distress.    Appearance: He is well-developed. He is not ill-appearing, toxic-appearing or diaphoretic.  HENT:     Head: Normocephalic and atraumatic.     Nose: No congestion or rhinorrhea.     Mouth/Throat:     Mouth: Mucous membranes are moist.     Pharynx: No oropharyngeal exudate.  Eyes:     Extraocular Movements: Extraocular movements intact.     Conjunctiva/sclera: Conjunctivae normal.     Pupils: Pupils are equal, round, and reactive to light.  Cardiovascular:     Rate and Rhythm: Normal rate and regular rhythm.     Pulses: Normal pulses.     Heart sounds: No murmur heard. Pulmonary:     Effort: Pulmonary effort is normal. No respiratory distress.     Breath sounds: Normal breath sounds. No wheezing, rhonchi or rales.  Chest:     Chest wall: No tenderness.  Abdominal:     Palpations: Abdomen is soft.     Tenderness: There is no abdominal tenderness. There is no guarding or rebound.  Musculoskeletal:        General: No swelling or tenderness.     Cervical back: Neck supple. No tenderness.  Skin:    General: Skin is warm and dry.     Capillary Refill: Capillary refill takes less than 2 seconds.     Findings: Erythema and rash present.  Neurological:     General: No focal deficit present.     Mental Status: He is alert. Mental status is at baseline.     Sensory: No sensory deficit.     Motor: No weakness.  Psychiatric:        Mood and Affect: Mood normal.    ED Results / Procedures / Treatments    Labs (all labs ordered are listed, but only abnormal results are displayed) Labs Reviewed  BASIC METABOLIC PANEL - Abnormal; Notable for the following components:      Result Value   CO2 20 (*)    Glucose, Bld 129 (*)    Calcium 8.4 (*)    All other components within normal limits  CBC WITH DIFFERENTIAL/PLATELET - Abnormal; Notable for the following components:   WBC 19.0 (*)    MCV 106.3 (*)    MCH 35.7 (*)    Neutro Abs 17.4 (*)    Abs Immature Granulocytes 0.09 (*)    All other components within normal  limits  CBC WITH DIFFERENTIAL/PLATELET    EKG EKG Interpretation  Date/Time:  Sunday December 29 2021 11:57:58 EST Ventricular Rate:  62 PR Interval:  209 QRS Duration: 92 QT Interval:  425 QTC Calculation: 432 R Axis:   -18 Text Interpretation: Sinus rhythm Borderline left axis deviation Abnormal R-wave progression, early transition When compared to prior, slower rate than prior. No STEMI Confirmed by Antony Blackbird [phone]) on 12/29/2021 12:05:51 PM  Radiology No results found.  Procedures Procedures    Medications Ordered in ED Medications  methylPREDNISolone sodium succinate (SOLU-MEDROL) 125 mg/2 mL injection 125 mg (125 mg Intravenous Given 12/29/21 1148)  famotidine (PEPCID) IVPB 20 mg premix (0 mg Intravenous Stopped 12/29/21 1218)  sodium chloride 0.9 % bolus 1,000 mL (0 mLs Intravenous Stopped 12/29/21 1303)    ED Course/ Medical Decision Making/ A&P                           Medical Decision Making  JORDAAN CURET is a 81 y.o. male with a past medical history significant for arthritis, hyperlipidemia, hypertension, GERD, and chronic COPD for which she says he takes chronic steroids and intermittent antibiotics for maintenance who presents with allergic reaction.  According to patient, at 7 AM he took a first dose of cefuroxime and shortly after started having diffuse itching and rash and feeling lightheaded and near syncopal.  He took some Benadryl and after  symptoms seem to worsen with the lightheadedness called EMS.  Patient was found to be hypotensive with blood pressure in the [id number] and 0000000 systolic.  EMS ended up giving epinephrine IM, IV Benadryl, and some fluids.  Patient had improvement in his blood pressure and the rash started to improve with the itching.  He denies nausea or vomiting and denies any syncope.  He denies any wheezing chest pain or shortness of breath.  He denies any feeling of throat closing but was very concerned with his reaction.  On arrival, he is denying other complaints.  He denies any recent fevers, chills congestion, cough.  Denies any current nausea.  Denies any diarrhea, or urinary changes.  On exam, he has diffuse urticarial rash that does not appear to be involving his mouth or oropharynx.  I do not appreciate any evidence of tongue swelling or lip swelling and patient does not report any difficulty breathing.  No stridor on exam.  No wheezing.  Lungs clear and chest nontender.  Abdomen nontender.  Moving all extremities.  Blood pressure had improved.  Given his epinephrine dosing, will monitor for several hours.  Will give steroids and further antihistamines to complement what EMS provided.  We will give some more fluids for the soft pressures in route.  I spoke with pharmacy who agreed that a 125 mg Solu-Medrol dose was appropriate despite his chronic steroids and will likely touch base with them again when he is stable for discharge home to discussed a several day burst of steroids to continue treating his allergic reaction.  Will closely monitor patient and if patient has improved symptoms after proving stability anticipate discharge home with prescription for EpiPen and steroids  After over 4 hours, patient's allergic reaction continues to remain improved.  He is feeling much better and blood pressures in the 120s.  We will allow him to eat and drink and if he does well and still is not having further symptoms, we  will get him ready for discharge with prescription for increase steroids  for several days and EpiPen prescription.  Anticipate discharge shortly.        Final Clinical Impression(s) / ED Diagnoses Final diagnoses:  Allergic reaction, initial encounter  Anaphylaxis, initial encounter    Rx / DC Orders ED Discharge Orders          Ordered    predniSONE (DELTASONE) 50 MG tablet  Daily        12/29/21 1459    EPINEPHrine 0.3 mg/0.3 mL IJ SOAJ injection  As needed        12/29/21 1459            Clinical Impression: 1. Allergic reaction, initial encounter   2. Anaphylaxis, initial encounter     Disposition: Discharge  Condition: Good  I have discussed the results, Dx and Tx plan with the pt(& family if present). He/she/they expressed understanding and agree(s) with the plan. Discharge instructions discussed at great length. Strict return precautions discussed and pt &/or family have verbalized understanding of the instructions. No further questions at time of discharge.    New Prescriptions   EPINEPHRINE 0.3 MG/0.3 ML IJ SOAJ INJECTION    Inject 0.3 mg into the muscle as needed for anaphylaxis.   PREDNISONE (DELTASONE) 50 MG TABLET    Take 1 tablet (50 mg total) by mouth daily for 5 days.    Follow Up: [name], [place] [address] [phone]     [facility] EMERGENCY DEPARTMENT [address] [id number] [place] [phone]        Analee Montee, Gwenyth Allegra, MD 12/29/21 [phone]

## 2021-12-30 ENCOUNTER — Encounter: Payer: Self-pay | Admitting: Emergency Medicine

## 2022-01-01 NOTE — Telephone Encounter (Signed)
Dr. Delton Coombes, please advise on pt's email regarding pt's abx and hospitalization. Thanks!

## 2022-01-03 NOTE — Telephone Encounter (Signed)
I agree with stopping the cefuroxime Agree with the prednisone We will continue to rotate the other two antibiotics He needs to see [place] in office in next 1-2 weeks, either RB or APP

## 2022-01-10 ENCOUNTER — Other Ambulatory Visit: Payer: Self-pay

## 2022-01-10 ENCOUNTER — Ambulatory Visit: Payer: Medicare HMO | Admitting: Nurse Practitioner

## 2022-01-10 ENCOUNTER — Encounter: Payer: Self-pay | Admitting: Nurse Practitioner

## 2022-01-10 VITALS — BP 124/76 | HR 77 | Temp 97.9°F | Ht 73.0 in | Wt 198.0 lb

## 2022-01-10 DIAGNOSIS — T782XXA Anaphylactic shock, unspecified, initial encounter: Secondary | ICD-10-CM | POA: Insufficient documentation

## 2022-01-10 DIAGNOSIS — T782XXD Anaphylactic shock, unspecified, subsequent encounter: Secondary | ICD-10-CM

## 2022-01-10 DIAGNOSIS — J449 Chronic obstructive pulmonary disease, unspecified: Secondary | ICD-10-CM

## 2022-01-10 DIAGNOSIS — T782XXS Anaphylactic shock, unspecified, sequela: Secondary | ICD-10-CM

## 2022-01-10 NOTE — Assessment & Plan Note (Signed)
Reaction after cefuroxime dosing despite having been on medication for an extended time period. 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 Unsure if chocolate ensures have actual coca in them so unclear if this was the cause or cefuroxime. Stop cefuroxime per Dr. Delton Coombes. Pt stable since. Carries Epipen with him now - discussed checking periodically to ensure it is in date and not expired. Seek emergency care if symptoms reoccur.

## 2022-01-10 NOTE — Patient Instructions (Addendum)
Stop cefuroxime. This has been added to your allergy list.  -Continue Breztri inhaler 2 puffs Twice daily. Brush tongue and rinse mouth afterwards -Continue Albuterol inhaler 2 puffs or duoneb 3 mL neb every 6 hours as needed for shortness of breath or wheezing. Notify if symptoms persist despite rescue inhaler/neb use. -Continue daily prednisone 5 mg  -Continue to use your rotating antibiotics for the first week of each alternating month: Doxycycline and Clarithromycin -Continue mucinex 600 mg Twice daily  -Continue flutter valve as needed -Continue to carry Epi pen with you  Notify if worsening breathlessness, cough, mucus production, fatigue, or wheezing occurs.  Maintain up to date vaccinations, including influenza, COVID, and pneumococcal.  Wash your hands often and avoid sick exposures.  Encouraged masking in crowds.  Avoid triggers, when possible.  Exercise, as tolerated. Notify if worsening symptoms upon exertion occur.    Follow up with Dr. Delton Coombes in 3 months. If symptoms do not improve or worsen, please contact office for sooner follow up or seek emergency care.

## 2022-01-10 NOTE — Progress Notes (Signed)
@Patient  ID: Tristan Le, male    DOB: 1941/08/03, 81 y.o.   MRN: [id number]  Chief Complaint  Patient presents with   Follow-up    Follow up per RB. Was in the hospital earlier this year and RB wanted to folow up to make sure everything was ok.     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  He is a patient of Dr. Agustina Caroli and last seen in office on 08/28/2021.  Past medical history significant for hypertension, HLD, arthritis  TEST/EVENTS:   08/28/2021: OV with Dr. Lamonte Sakai.  Continued on chronic daily prednisone 5 mg and rotating antibiotics every other month (clarithromycin, doxycycline, cefuroxime).  Previously on nebulizer treatments -discontinued at this visit and started on Breztri 2 puffs twice daily.  Breathing stable; able to walk without difficulties; experiences shortness of breath with carrying objects.  Occasional daily cough without much sputum.  No recent flares.  12/29/2021: ED visit for anaphylaxis.  After taking cefuroxime that morning he developed diffuse itching and rash, lightheadedness and near syncope.  Self-administered Benadryl at home and contacted EMS.  Found to be hypotensive upon arrival -administered epinephrine IM, IV Benadryl and fluids administered 125 Solu-Medrol in ED.  Stable after 4 hours and was discharged home.  01/10/2022: Today-Hospital follow-up Patient presents today for hospital follow-up after suspected anaphylactic response to cefuroxime.  He was advised by Dr. Lamonte Sakai to stop cefuroxime, continue doxycycline and clarithromycin, and to come in for follow up visit. He reports feeling well today and has not reported any further similar events. His breathing is well-controlled and he reports noticing a significant improvement after starting Breztri. He states that it is "a miracle drug" and is very happy to be on it. He has a rare  productive cough with small amount of clear sputum. He also has an occasional wheeze that resolves with cough or rescue inhaler. He denies any new uticaria, feelings of tongue or throat swelling, lightheadedness or dizziness. He continues on [place] Twice daily and duonebs Twice daily. He remembers being told to do them Twice daily so that is why he is doing them so frequently. He does not require his rescue inhaler often. He continues on mucinex Twice daily and daily prednisone. He now carries an Epipen with him. Overall, he feels well and offers no further complaints.   Allergies  Allergen Reactions   Cefuroxime Anaphylaxis and Itching   Penicillins     REACTION: Upset stomach,mouth ulcers,rash   Sulfonamide Derivatives     REACTION: Rash with huge blisters    Immunization History  Administered Date(s) Administered   PFIZER(Purple Top)SARS-COV-2 Vaccination 02/24/2020, 03/20/2020, 10/05/2020    Past Medical History:  Diagnosis Date   Arthritis    Chest pain    COPD (chronic obstructive pulmonary disease) ([place]) 05/18/2019   Cough 05/18/2019   06/13/2019-AFB-negative Fungal culture-showing yeast Respiratory sputum culture-negative    GERD (gastroesophageal reflux disease)    Hyperlipidemia    Hypertension     Tobacco History: Social History   Tobacco Use  Smoking Status Former   Packs/day: 0.50   Years: 60.00   Pack years: 30.00   Types: Cigarettes   Quit date: 04/27/2019   Years since quitting: 2.7  Smokeless Tobacco Never   Counseling given: Not Answered   Outpatient Medications Prior to Visit  Medication Sig Dispense Refill   albuterol (VENTOLIN HFA) 108 (90 Base) MCG/ACT inhaler INHALE  1 TO 2 PUFFS INTO THE LUNGS DAILY AS NEEDED FOR SHORTNESS OF BREATH. 1 each 3   Budeson-Glycopyrrol-Formoterol (BREZTRI AEROSPHERE) 160-9-4.8 MCG/ACT AERO      clarithromycin (BIAXIN) 250 MG tablet As directed by pulmonology on rotating months     diltiazem (CARDIZEM CD) 240 MG 24 hr  capsule TAKE 1 CAPSULE (240 MG TOTAL) BY MOUTH DAILY. 90 capsule 2   doxycycline (VIBRA-TABS) 100 MG tablet As directed by pulmonology on rotating months     EPINEPHrine 0.3 mg/0.3 mL IJ SOAJ injection Inject 0.3 mg into the muscle as needed for anaphylaxis. 1 each 0   Evolocumab (REPATHA SURECLICK) [id number] MG/ML SOAJ Inject 1 pen into the skin every 14 (fourteen) days. 6 mL 3   guaiFENesin (MUCINEX) 600 MG 12 hr tablet Take 1 tablet by mouth 2 (two) times daily.      hydrochlorothiazide (MICROZIDE) 12.5 MG capsule Take 1 capsule (12.5 mg total) by mouth daily. 30 capsule 0   Icosapent Ethyl (VASCEPA) 0.5 g CAPS Take 4 capsules by mouth 2 (two) times daily. 720 capsule 2   ipratropium-albuterol (DUONEB) 0.5-2.5 (3) MG/3ML SOLN USE 1 VIAL IN NEBULIZER EVERY 6 HOURS 120 mL 11   ipratropium-albuterol (DUONEB) 0.5-2.5 (3) MG/3ML SOLN Take 3 mLs by nebulization every 6 (six) hours as needed. 360 mL 5   Omega-3 Fatty Acids (FISH OIL) 1000 MG CAPS Take 2 g by mouth daily.      predniSONE (DELTASONE) 5 MG tablet Take 1 tablet (5 mg total) by mouth daily. 90 tablet 3   Respiratory Therapy Supplies (FLUTTER) DEVI Use as directed 1 each 0   triamcinolone cream (KENALOG) 0.1 % Apply twice day if needed to red itchy areas below the face.  Wait 10 minutes and then [place] Eucerin cream in the bad areas and lotion elsewhere. 45 g 0   budesonide (PULMICORT) 0.5 MG/2ML nebulizer solution USE 1 VIAL  IN  NEBULIZER TWICE  DAILY - rinse mouth after treatment 60 mL 11   cefUROXime (CEFTIN) 250 MG tablet As directed by pulmonology on rotating months.     YUPELRI 175 MCG/3ML nebulizer solution INHALE THE CONTENTS OF 1 VIAL VIA NEBULIZER EVERY DAY 90 mL 3   Facility-Administered Medications Prior to Visit  Medication Dose Route Frequency Provider Last Rate Last Admin   0.9 %  sodium chloride infusion  500 mL Intravenous Once Nandigam, Kavitha V, MD         Review of Systems:   Constitutional: No weight loss or gain, night  sweats, fevers, chills, fatigue, or lassitude. HEENT: No headaches, difficulty swallowing, tooth/dental problems, or sore throat. No sneezing, itching, ear ache, nasal congestion, or post nasal drip CV:  No chest pain, orthopnea, PND, swelling in lower extremities, anasarca, dizziness, palpitations, syncope Resp: +shortness of breath with exertion (improved since starting Breztri); occasional cough with small amount of clear sputum (unchanged from baseline); occasional wheeze (unchanged from baseline). No excess mucus or change in color of mucus.  No hemoptysis. No chest wall deformity GI:  No heartburn, indigestion, abdominal pain, nausea, vomiting, diarrhea, change in bowel habits, loss of appetite, bloody stools.  GU: No dysuria, change in color of urine, urgency or frequency.  No flank pain, no hematuria  Skin: No rash, lesions, ulcerations MSK:  No joint pain or swelling.  No decreased range of motion.  No back pain. Neuro: No dizziness or lightheadedness.  Psych: No depression or anxiety. Mood stable.     Physical Exam:  BP 124/76 (BP  Location: Right Arm, Patient Position: Sitting, Cuff Size: Normal)    Pulse 77    Temp 97.9 F (36.6 C) (Oral)    Ht 6\' 1"  (1.854 m)    Wt 198 lb (89.8 kg)    SpO2 97%    BMI 26.12 kg/m   GEN: Pleasant, interactive, well-nourished; in no acute distress. HEENT:  Normocephalic and atraumatic. EACs patent bilaterally. TM pearly gray with present light reflex bilaterally. PERRLA. Sclera white. Nasal turbinates pink, moist and patent bilaterally. No rhinorrhea present. Oropharynx pink and moist, without exudate or edema. No lesions, ulcerations, or postnasal drip.  NECK:  Supple w/ fair ROM. No JVD present. Normal carotid impulses w/o bruits. Thyroid symmetrical with no goiter or nodules palpated. No lymphadenopathy.   CV: RRR, no m/r/g, no peripheral edema. Pulses intact, +2 bilaterally. No cyanosis, pallor or clubbing. PULMONARY:  Unlabored, regular breathing.  Diminished bases bilaterally; expiratory wheeze noted RLL -cleared with cough; otherwise clear. No accessory muscle use. No dullness to percussion. GI: BS present and normoactive. Soft, non-tender to palpation. No organomegaly or masses detected. No CVA tenderness. MSK: No erythema, warmth or tenderness. Cap refil <2 sec all extrem. No deformities or joint swelling noted.  Neuro: A/Ox3. No focal deficits noted.   Skin: Warm, no lesions or rashe Psych: Normal affect and behavior. Judgement and thought content appropriate.     Lab Results:  CBC    Component Value Date/Time   WBC 19.0 (H) 12/29/2021 1317   RBC 4.43 12/29/2021 1317   HGB 15.8 12/29/2021 1317   HGB 15.5 10/06/2019 1107   HCT 47.1 12/29/2021 1317   HCT 44.0 10/06/2019 1107   PLT 170 12/29/2021 1317   PLT 212 10/06/2019 1107   MCV 106.3 (H) 12/29/2021 1317   MCV 98 (H) 10/06/2019 1107   MCH 35.7 (H) 12/29/2021 1317   MCHC 33.5 12/29/2021 1317   RDW 12.3 12/29/2021 1317   RDW 11.9 10/06/2019 1107   LYMPHSABS 1.0 12/29/2021 1317   MONOABS 0.5 12/29/2021 1317   EOSABS 0.0 12/29/2021 1317   BASOSABS 0.0 12/29/2021 1317    BMET    Component Value Date/Time   NA 136 12/29/2021 1139   NA 137 10/06/2019 1107   K 3.6 12/29/2021 1139   CL 106 12/29/2021 1139   CO2 20 (L) 12/29/2021 1139   GLUCOSE 129 (H) 12/29/2021 1139   BUN 20 12/29/2021 1139   BUN 18 10/06/2019 1107   CREATININE 0.89 12/29/2021 1139   CALCIUM 8.4 (L) 12/29/2021 1139   GFRNONAA >60 12/29/2021 1139   GFRAA 75 10/06/2019 1107    BNP No results found for: BNP   Imaging:  No results found.    PFT Results Latest Ref Rng & Units 08/16/2019  FVC-Pre L 4.29  FVC-Predicted Pre % 91  FVC-Post L 4.09  FVC-Predicted Post % 86  Pre FEV1/FVC % % 76  Post FEV1/FCV % % 81  FEV1-Pre L 3.28  FEV1-Predicted Pre % 96  FEV1-Post L 3.33  DLCO uncorrected ml/min/mmHg 21.13  DLCO UNC% % 77  DLVA Predicted % 88  TLC L 5.91  TLC % Predicted % 77  RV  % Predicted % 60    No results found for: NITRICOXIDE      Assessment & Plan:   COPD (chronic obstructive pulmonary disease) ([place]) Feels well. Improvement after starting Breztri. No flare with recent anaphylactic reaction - breathing stable. Continue Breztri 2 puffs Twice daily. Advise to try decreasing Duoneb to once daily then PRN  if no worsening SOB or wheezing occurs. Continue PRN albuterol. Discontinue cefuroxime. Continue rotating abx with clarithromycin and doxycycline. Continue daily prednisone 5 mg and Twice daily mucinex.   Patient Instructions  Stop cefuroxime. This has been added to your allergy list.  -Continue Breztri inhaler 2 puffs Twice daily. Brush tongue and rinse mouth afterwards -Continue Albuterol inhaler 2 puffs or duoneb 3 mL neb every 6 hours as needed for shortness of breath or wheezing. Notify if symptoms persist despite rescue inhaler/neb use. -Continue daily prednisone 5 mg  -Continue to use your rotating antibiotics for the first week of each alternating month: Doxycycline and Clarithromycin -Continue mucinex 600 mg Twice daily  -Continue flutter valve as needed -Continue to carry Epi pen with you  Notify if worsening breathlessness, cough, mucus production, fatigue, or wheezing occurs.  Maintain up to date vaccinations, including influenza, COVID, and pneumococcal.  Wash your hands often and avoid sick exposures.  Encouraged masking in crowds.  Avoid triggers, when possible.  Exercise, as tolerated. Notify if worsening symptoms upon exertion occur.    Follow up with Dr. Lamonte Sakai in 3 months. If symptoms do not improve or worsen, please contact office for sooner follow up or seek emergency care.   Anaphylaxis Reaction after cefuroxime dosing despite having been on medication for an extended time period. 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 Unsure if chocolate ensures have actual coca in them  so unclear if this was the cause or cefuroxime. Stop cefuroxime per Dr. Lamonte Sakai. Pt stable since. Carries Epipen with him now - discussed checking periodically to ensure it is in date and not expired. Seek emergency care if symptoms reoccur.    Clayton Bibles, NP 01/10/2022  Pt aware and understands NP's role.

## 2022-01-10 NOTE — Assessment & Plan Note (Signed)
Feels well. Improvement after starting Breztri. No flare with recent anaphylactic reaction - breathing stable. Continue Breztri 2 puffs Twice daily. Advise to try decreasing Duoneb to once daily then PRN if no worsening SOB or wheezing occurs. Continue PRN albuterol. Discontinue cefuroxime. Continue rotating abx with clarithromycin and doxycycline. Continue daily prednisone 5 mg and Twice daily mucinex.   Patient Instructions  Stop cefuroxime. This has been added to your allergy list.  -Continue Breztri inhaler 2 puffs Twice daily. Brush tongue and rinse mouth afterwards -Continue Albuterol inhaler 2 puffs or duoneb 3 mL neb every 6 hours as needed for shortness of breath or wheezing. Notify if symptoms persist despite rescue inhaler/neb use. -Continue daily prednisone 5 mg  -Continue to use your rotating antibiotics for the first week of each alternating month: Doxycycline and Clarithromycin -Continue mucinex 600 mg Twice daily  -Continue flutter valve as needed -Continue to carry Epi pen with you  Notify if worsening breathlessness, cough, mucus production, fatigue, or wheezing occurs.  Maintain up to date vaccinations, including influenza, COVID, and pneumococcal.  Wash your hands often and avoid sick exposures.  Encouraged masking in crowds.  Avoid triggers, when possible.  Exercise, as tolerated. Notify if worsening symptoms upon exertion occur.    Follow up with Dr. Delton Coombes in 3 months. If symptoms do not improve or worsen, please contact office for sooner follow up or seek emergency care.

## 2022-01-16 ENCOUNTER — Other Ambulatory Visit: Payer: Self-pay | Admitting: Cardiovascular Disease

## 2022-01-16 ENCOUNTER — Other Ambulatory Visit: Payer: Self-pay | Admitting: Emergency Medicine

## 2022-01-23 ENCOUNTER — Telehealth: Payer: Self-pay | Admitting: Cardiovascular Disease

## 2022-01-23 MED ORDER — DILTIAZEM HCL ER COATED BEADS 240 MG PO CP24: 240.0000 mg | ORAL_CAPSULE | Freq: Every day | ORAL | 0 refills | Status: DC

## 2022-01-23 NOTE — Telephone Encounter (Signed)
Spoke with pt who states his Diltiazem 240mg  - 1 tablet by mouth daily ordered on 01/16/2022 has not yet been shipped.  Pt states he is out of medication as of today.  Pt advised office does not have samples of Diltiazem. 14 days worth of Diltiazem 240 - 1 tablet by mouth daily sent to local [facility] as requested.  Pt verbalizes understanding and thanked 01/18/2022.

## 2022-01-23 NOTE — Telephone Encounter (Signed)
Pt c/o medication issue:  1. Name of Medication: diltiazem (CARDIZEM CD) 240 MG 24 hr capsule  2. How are you currently taking this medication (dosage and times per day)? TAKE 1 CAPSULE (240 MG TOTAL) BY MOUTH DAILY.  3. Are you having a reaction (difficulty breathing--STAT)? no  4. What is your medication issue? Patient calling to see if we have in samples in office. His pharmacy is late with getting him the medication. Patient states he only need a couple of pills. Please advise

## 2022-02-16 ENCOUNTER — Other Ambulatory Visit: Payer: Self-pay | Admitting: Cardiovascular Disease

## 2022-02-16 ENCOUNTER — Other Ambulatory Visit: Payer: Self-pay | Admitting: Emergency Medicine

## 2022-02-16 DIAGNOSIS — J410 Simple chronic bronchitis: Secondary | ICD-10-CM

## 2022-04-04 ENCOUNTER — Encounter: Payer: Self-pay | Admitting: Emergency Medicine

## 2022-04-04 ENCOUNTER — Ambulatory Visit: Payer: Medicare HMO | Admitting: Emergency Medicine

## 2022-04-04 DIAGNOSIS — J449 Chronic obstructive pulmonary disease, unspecified: Secondary | ICD-10-CM

## 2022-04-04 MED ORDER — AZITHROMYCIN 250 MG PO TABS: 250.0000 mg | ORAL_TABLET | Freq: Every day | ORAL | 5 refills | Status: DC

## 2022-04-04 NOTE — Assessment & Plan Note (Signed)
We will stop your rotating doxycycline and clarithromycin. ?We will start azithromycin 250 mg daily every day.  Please call you have any allergic symptoms associated with this medication ?Please continue prednisone 5 mg once daily. ?Continue Breztri 2 puffs twice a day.  Rinse and gargle after using. ?Keep albuterol available to use 2 puffs up to every 4 hours if needed for shortness of breath, chest tightness, wheezing.  ?Use your DuoNeb up to every 6 hours if needed for shortness of breath, chest tightness, wheezing. ?Continue Mucinex twice a day ?Follow with APP in 2 months ?Follow Dr. Delton Coombes in 6 months, sooner if you have any problems. ?

## 2022-04-04 NOTE — Patient Instructions (Addendum)
Thank you for attending this regular follow-up visit for your COPD.  I am glad that you are disease has been stable and that you are on a good medical regimen.  We will plan to continue and adjust as below.  We will continue our regular follow-up twice a year. ? ? ?We will stop your rotating doxycycline and clarithromycin. ?We will start azithromycin 250 mg daily every day.  Please call you have any allergic symptoms associated with this medication ?Please continue prednisone 5 mg once daily. ?Continue Breztri 2 puffs twice a day.  Rinse and gargle after using. ?Keep albuterol available to use 2 puffs up to every 4 hours if needed for shortness of breath, chest tightness, wheezing.  ?Use your DuoNeb up to every 6 hours if needed for shortness of breath, chest tightness, wheezing. ?Continue Mucinex twice a day ?Follow with APP in 2 months ?Follow Dr. Delton Coombes in 6 months, sooner if you have any problems. ? ?

## 2022-04-04 NOTE — Progress Notes (Signed)
? ?  Subjective:  ? ? Patient ID: Tristan Le, male    DOB: October 23, 1941, 81 y.o.   MRN: [id number] ? ?HPI ? ?ROV 04/04/22 --81 year old gentleman with history of obesity, COPD, mixed obstruction and restriction on his pulmonary function testing.  Also limited somewhat by deconditioning.  He has a chronic bronchitic phenotype and has been on chronic prednisone 5 mg daily, rotating antibiotics.  I had him on clarithromycin, doxycycline and cefuroxime.  In January he had an acute reaction, itching, near syncope that was felt to be related to the cefuroxime.  He had to be seen and treated in the emergency department.  We stopped the cefuroxime and continue to cycle the doxycycline and clarithromycin. He had a similar experience when he tried to take doxycycline in February > hands turned bright red, so he stopped it after 1 dose ?Currently managed on Breztri.  Uses albuterol rarely. Uses DuoNeb 1-2x a day  ?On Mucinex twice daily, uses flutter valve as needed ? ? ?Review of Systems  ?Constitutional:  Negative for fever and unexpected weight change.  ?HENT:  Negative for congestion, dental problem, ear pain, nosebleeds, postnasal drip, rhinorrhea, sinus pressure, sneezing, sore throat and trouble swallowing.   ?Eyes:  Negative for redness and itching.  ?Respiratory:  Positive for cough, shortness of breath and wheezing. Negative for chest tightness.   ?Cardiovascular:  Negative for chest pain, palpitations and leg swelling.  ?Gastrointestinal:  Negative for nausea and vomiting.  ?Genitourinary:  Negative for dysuria.  ?Musculoskeletal:  Negative for joint swelling.  ?Skin:  Negative for rash.  ?Neurological:  Negative for headaches.  ?Hematological:  Does not bruise/bleed easily.  ?Psychiatric/Behavioral:  Negative for dysphoric mood. The patient is not nervous/anxious.   ? ? ?   ?Objective:  ? Physical Exam ?Vitals:  ? 04/04/22 1434  ?BP: 130/72  ?Pulse: 75  ?Temp: 98 ?F (36.7 ?C)  ?TempSrc: Oral  ?SpO2: 97%  ?Weight: 199  lb 12.8 oz (90.6 kg)  ?Height: 6' (1.829 m)  ? ? ?Gen: Pleasant, well-nourished, in no distress,  normal affect ? ?ENT: No lesions,  mouth clear, edentulous, oropharynx clear, no postnasal drip ? ?Neck: No JVD, no stridor ? ?Lungs: No use of accessory muscles, rhonchi on the right.  Distant, no wheeze ? ?Cardiovascular: RRR, heart sounds normal, no murmur or gallops, no peripheral edema ? ?Musculoskeletal: No deformities, no cyanosis or clubbing ? ?Neuro: alert, awake, non focal ? ?Skin: Warm, no lesions or rash ? ?   ?Assessment & Plan:  ?COPD (chronic obstructive pulmonary disease) ([facility]) ?We will stop your rotating doxycycline and clarithromycin. ?We will start azithromycin 250 mg daily every day.  Please call you have any allergic symptoms associated with this medication ?Please continue prednisone 5 mg once daily. ?Continue Breztri 2 puffs twice a day.  Rinse and gargle after using. ?Keep albuterol available to use 2 puffs up to every 4 hours if needed for shortness of breath, chest tightness, wheezing.  ?Use your DuoNeb up to every 6 hours if needed for shortness of breath, chest tightness, wheezing. ?Continue Mucinex twice a day ?Follow with APP in 2 months ?Follow Dr. Delton Coombes in 6 months, sooner if you have any problems. ? ?Levy Pupa, MD, PhD ?04/04/2022, 2:55 PM ?[facility] ?[phone] or if no answer [phone] ? ?

## 2022-06-06 ENCOUNTER — Ambulatory Visit: Payer: Medicare HMO | Admitting: Nurse Practitioner

## 2022-06-06 ENCOUNTER — Encounter: Payer: Self-pay | Admitting: Nurse Practitioner

## 2022-06-06 VITALS — BP 124/72 | HR 99 | Temp 98.1°F | Ht 72.0 in | Wt 199.0 lb

## 2022-06-06 DIAGNOSIS — Z5181 Encounter for therapeutic drug level monitoring: Secondary | ICD-10-CM

## 2022-06-06 DIAGNOSIS — J449 Chronic obstructive pulmonary disease, unspecified: Secondary | ICD-10-CM | POA: Diagnosis not present

## 2022-06-06 DIAGNOSIS — Z87891 Personal history of nicotine dependence: Secondary | ICD-10-CM | POA: Diagnosis not present

## 2022-06-06 MED ORDER — BREZTRI AEROSPHERE 160-9-4.8 MCG/ACT IN AERO: INHALATION_SPRAY | RESPIRATORY_TRACT | 1 refills | Status: DC

## 2022-06-06 MED ORDER — AZITHROMYCIN 250 MG PO TABS: 250.0000 mg | ORAL_TABLET | Freq: Every day | ORAL | 1 refills | Status: DC

## 2022-06-06 NOTE — Patient Instructions (Addendum)
-  Continue Breztri inhaler 2 puffs Twice daily. Brush tongue and rinse mouth afterwards -Continue Albuterol inhaler 2 puffs or duoneb 3 mL neb every 6 hours as needed for shortness of breath or wheezing. Notify if symptoms persist despite rescue inhaler/neb use. -Continue daily prednisone 5 mg  -Continue mucinex 600 mg Twice daily  -Continue azithromycin 250 mg daily  -Continue flutter valve as needed -Continue to carry Epi pen with you   Low dose CT chest for lung cancer screening - someone will contact you for scheduling.   Follow up with Dr. Delton Coombes in 3 months. If symptoms do not improve or worsen, please contact office for sooner follow up or seek emergency care.

## 2022-06-06 NOTE — Assessment & Plan Note (Signed)
Former heavy smoker. Quit in 2020. Will send for LDCT for lung cancer screening.

## 2022-06-06 NOTE — Assessment & Plan Note (Addendum)
Severe COPD maintained on triple therapy inhaler,  chronic prednisone 5 mg and daily macrolide. Tolerate well.. Stable today. EKG for QTc monitoring given chronic use of azithromycin is stable today.  Patient Instructions  -Continue Breztri inhaler 2 puffs Twice daily. Brush tongue and rinse mouth afterwards -Continue Albuterol inhaler 2 puffs or duoneb 3 mL neb every 6 hours as needed for shortness of breath or wheezing. Notify if symptoms persist despite rescue inhaler/neb use. -Continue daily prednisone 5 mg  -Continue mucinex 600 mg Twice daily  -Continue azithromycin 250 mg daily  -Continue flutter valve as needed -Continue to carry Epi pen with you   Follow up with Dr. Delton Coombes in 3 months. If symptoms do not improve or worsen, please contact office for sooner follow up or seek emergency care.

## 2022-06-06 NOTE — Progress Notes (Signed)
@Patient  ID: Tristan Le, male    DOB: [birth year], 81 y.o.   MRN: [id number]  Chief Complaint  Patient presents with   Follow-up    He is about the same and Tristan Le is working well.     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  He is a patient of Dr. Agustina Caroli and last seen in office on 04/04/2022.  Past medical history significant for hypertension, HLD, arthritis  TEST/EVENTS:  07/13/2019 CXR 2 view: Calcified right hilar lymph nodes.  Emphysema is present.  No acute abnormality present.  04/04/2022: OV with Dr. Lamonte Sakai.  Previously on clarithromycin, doxycycline and cefuroxime.  Had an acute reaction after taking cefuroxime with itching and near syncope.  Treated in the ED and was advised to stop this.  He then had a similar experience when he tried to take doxycycline in February so he stopped after 1 dose.  Recommended stopping rotating Doxy and clarithromycin.  Started azithromycin 250 mg daily.  Continued on 5 mg prednisone daily.  Continued on Breztri.  Follow-up 2 months.  06/06/2022: Today-follow-up Patient presents today for follow up. His breathing has been stable since we saw him last. Still has some DOE but it is at baseline. Still has an occasional chronic cough and wheezing. He has had no issues with the azithromycin. He denies fevers, night sweats, hemoptysis, anorexia, weight loss. He continues on [place] Twice daily and prednisone 5 mg. Rarely uses albuterol.   Allergies  Allergen Reactions   Cefuroxime Anaphylaxis and Itching   Penicillins Other (See Comments)    REACTION: Upset stomach,mouth ulcers,rash   Sulfonamide Derivatives Rash    REACTION: Rash with huge blisters    Immunization History  Administered Date(s) Administered   Influenza,inj,Quad PF,6+ Mos 09/30/2021   PFIZER Comirnaty(Gray Top)Covid-19 Tri-Sucrose Vaccine 03/30/2021    PFIZER(Purple Top)SARS-COV-2 Vaccination 02/24/2020, 03/20/2020, 10/05/2020    Past Medical History:  Diagnosis Date   Arthritis    Chest pain    COPD (chronic obstructive pulmonary disease) ([place]) 05/18/2019   Cough 05/18/2019   06/13/2019-AFB-negative Fungal culture-showing yeast Respiratory sputum culture-negative    GERD (gastroesophageal reflux disease)    Hyperlipidemia    Hypertension     Tobacco History: Social History   Tobacco Use  Smoking Status Former   Packs/day: 0.50   Years: 60.00   Total pack years: 30.00   Types: Cigarettes   Quit date: 04/27/2019   Years since quitting: 3.1  Smokeless Tobacco Never   Counseling given: Not Answered   Outpatient Medications Prior to Visit  Medication Sig Dispense Refill   albuterol (VENTOLIN HFA) 108 (90 Base) MCG/ACT inhaler INHALE 1 TO 2 PUFFS EVERY DAY INTO THE LUNGS AS NEEDED FOR SHORTNESS OF BREATH 1 each 3   diltiazem (CARDIZEM CD) 240 MG 24 hr capsule Take 1 capsule (240 mg total) by mouth daily. 14 capsule 0   EPINEPHrine 0.3 mg/0.3 mL IJ SOAJ injection Inject 0.3 mg into the muscle as needed for anaphylaxis. 1 each 0   guaiFENesin (MUCINEX) 600 MG 12 hr tablet Take 1 tablet by mouth 2 (two) times daily.      hydrochlorothiazide (MICROZIDE) 12.5 MG capsule Take 1 capsule (12.5 mg total) by mouth daily. 30 capsule 0   ipratropium-albuterol (DUONEB) 0.5-2.5 (3) MG/3ML SOLN Take 3 mLs by nebulization every 6 (six) hours as needed. 360 mL 5   predniSONE (DELTASONE) 5 MG tablet Take  1 tablet (5 mg total) by mouth daily. 90 tablet 3   REPATHA SURECLICK XX123456 MG/ML SOAJ INJECT 1 PEN INTO THE SKIN EVERY 14 (FOURTEEN) DAYS. 6 mL 3   Respiratory Therapy Supplies (FLUTTER) DEVI Use as directed 1 each 0   azithromycin (ZITHROMAX) 250 MG tablet Take 1 tablet (250 mg total) by mouth daily. 30 tablet 5   Budeson-Glycopyrrol-Formoterol (BREZTRI AEROSPHERE) 160-9-4.8 MCG/ACT AERO INHALE 2 PUFFS IN THE MORNING AND AT BEDTIME. 10.7 g 0    clarithromycin (BIAXIN) 250 MG tablet As directed by pulmonology on rotating months     doxycycline (VIBRA-TABS) 100 MG tablet As directed by pulmonology on rotating months (Patient not taking: Reported on 04/04/2022)     triamcinolone cream (KENALOG) 0.1 % Apply twice day if needed to red itchy areas below the face.  Wait 10 minutes and then [place] Eucerin cream in the bad areas and lotion elsewhere. (Patient not taking: Reported on 06/06/2022) 45 g 0   Facility-Administered Medications Prior to Visit  Medication Dose Route Frequency Provider Last Rate Last Admin   0.9 %  sodium chloride infusion  500 mL Intravenous Once Nandigam, Kavitha V, MD         Review of Systems:   Constitutional: No weight loss or gain, night sweats, fevers, chills, fatigue, or lassitude. HEENT: No headaches, difficulty swallowing, tooth/dental problems, or sore throat. No sneezing, itching, ear ache, nasal congestion, or post nasal drip CV:  No chest pain, orthopnea, PND, swelling in lower extremities, anasarca, dizziness, palpitations, syncope Resp: +shortness of breath with exertion (stable); occasional cough with small amount of clear sputum (unchanged from baseline); occasional wheeze (unchanged from baseline). No excess mucus or change in color of mucus.  No hemoptysis. No chest wall deformity GI:  No heartburn, indigestion, abdominal pain, nausea, vomiting, diarrhea, change in bowel habits, loss of appetite, bloody stools.  GU: No dysuria, change in color of urine, urgency or frequency.  No flank pain, no hematuria  Skin: No rash, lesions, ulcerations MSK:  No joint pain or swelling.  No decreased range of motion.  No back pain. Neuro: No dizziness or lightheadedness.  Psych: No depression or anxiety. Mood stable.     Physical Exam:  BP 124/72 (BP Location: Right Arm, Cuff Size: Normal)   Pulse 99   Temp 98.1 F (36.7 C) (Oral)   Ht 6' (1.829 m)   Wt 199 lb (90.3 kg)   SpO2 96%   BMI 26.99 kg/m   GEN:  Pleasant, interactive, well-nourished; in no acute distress. HEENT:  Normocephalic and atraumatic. EACs patent bilaterally. TM pearly gray with present light reflex bilaterally. PERRLA. Sclera white. Nasal turbinates pink, moist and patent bilaterally. No rhinorrhea present. Oropharynx pink and moist, without exudate or edema. No lesions, ulcerations, or postnasal drip.  NECK:  Supple w/ fair ROM. No JVD present. Normal carotid impulses w/o bruits. Thyroid symmetrical with no goiter or nodules palpated. No lymphadenopathy.   CV: RRR, no m/r/g, no peripheral edema. Pulses intact, +2 bilaterally. No cyanosis, pallor or clubbing. PULMONARY:  Unlabored, regular breathing. Diminished bilaterally with minimal end expiratory wheeze; resolved with deep breathing. No accessory muscle use. No dullness to percussion. GI: BS present and normoactive. Soft, non-tender to palpation. No organomegaly or masses detected. No CVA tenderness. MSK: No erythema, warmth or tenderness. Cap refil <2 sec all extrem. No deformities or joint swelling noted.  Neuro: A/Ox3. No focal deficits noted.   Skin: Warm, no lesions or rashe Psych: Normal affect and behavior.  Judgement and thought content appropriate.     Lab Results:  CBC    Component Value Date/Time   WBC 19.0 (H) 12/29/2021 1317   RBC 4.43 12/29/2021 1317   HGB 15.8 12/29/2021 1317   HGB 15.5 10/06/2019 1107   HCT 47.1 12/29/2021 1317   HCT 44.0 10/06/2019 1107   PLT 170 12/29/2021 1317   PLT 212 10/06/2019 1107   MCV 106.3 (H) 12/29/2021 1317   MCV 98 (H) 10/06/2019 1107   MCH 35.7 (H) 12/29/2021 1317   MCHC 33.5 12/29/2021 1317   RDW 12.3 12/29/2021 1317   RDW 11.9 10/06/2019 1107   LYMPHSABS 1.0 12/29/2021 1317   MONOABS 0.5 12/29/2021 1317   EOSABS 0.0 12/29/2021 1317   BASOSABS 0.0 12/29/2021 1317    BMET    Component Value Date/Time   NA 136 12/29/2021 1139   NA 137 10/06/2019 1107   K 3.6 12/29/2021 1139   CL 106 12/29/2021 1139   CO2  20 (L) 12/29/2021 1139   GLUCOSE 129 (H) 12/29/2021 1139   BUN 20 12/29/2021 1139   BUN 18 10/06/2019 1107   CREATININE 0.89 12/29/2021 1139   CALCIUM 8.4 (L) 12/29/2021 1139   GFRNONAA >60 12/29/2021 1139   GFRAA 75 10/06/2019 1107    BNP No results found for: "BNP"   Imaging:  No results found.       Latest Ref Rng & Units 08/16/2019    8:42 AM  PFT Results  FVC-Pre L 4.29  P  FVC-Predicted Pre % 91  P  FVC-Post L 4.09  P  FVC-Predicted Post % 86  P  Pre FEV1/FVC % % 76  P  Post FEV1/FCV % % 81  P  FEV1-Pre L 3.28  P  FEV1-Predicted Pre % 96  P  FEV1-Post L 3.33  P  DLCO uncorrected ml/min/mmHg 21.13  P  DLCO UNC% % 77  P  DLVA Predicted % 88  P  TLC L 5.91  P  TLC % Predicted % 77  P  RV % Predicted % 60  P    P Preliminary result    No results found for: "NITRICOXIDE"      Assessment & Plan:   COPD (chronic obstructive pulmonary disease) (HCC) Severe COPD maintained on triple therapy inhaler,  chronic prednisone 5 mg and daily macrolide. Tolerate well.. Stable today. EKG for QTc monitoring given chronic use of azithromycin is stable today.  Patient Instructions  -Continue Breztri inhaler 2 puffs Twice daily. Brush tongue and rinse mouth afterwards -Continue Albuterol inhaler 2 puffs or duoneb 3 mL neb every 6 hours as needed for shortness of breath or wheezing. Notify if symptoms persist despite rescue inhaler/neb use. -Continue daily prednisone 5 mg  -Continue mucinex 600 mg Twice daily  -Continue azithromycin 250 mg daily  -Continue flutter valve as needed -Continue to carry Epi pen with you   Follow up with Dr. Lamonte Sakai in 3 months. If symptoms do not improve or worsen, please contact office for sooner follow up or seek emergency care.    History of tobacco abuse Former heavy smoker. Quit in 2020. Will send for LDCT for lung cancer screening.   Clayton Bibles, NP 06/06/2022  Pt aware and understands NP's role.

## 2022-06-20 ENCOUNTER — Ambulatory Visit (INDEPENDENT_AMBULATORY_CARE_PROVIDER_SITE_OTHER)
Admission: RE | Admit: 2022-06-20 | Discharge: 2022-06-20 | Disposition: A | Discharge status: Home / Self Care | Payer: Medicare HMO | Source: Ambulatory Visit | Attending: Nurse Practitioner | Admitting: Nurse Practitioner

## 2022-06-20 DIAGNOSIS — J439 Emphysema, unspecified: Secondary | ICD-10-CM

## 2022-06-20 DIAGNOSIS — Z122 Encounter for screening for malignant neoplasm of respiratory organs: Secondary | ICD-10-CM | POA: Diagnosis not present

## 2022-06-20 DIAGNOSIS — I700 Atherosclerosis of aorta: Secondary | ICD-10-CM

## 2022-06-20 DIAGNOSIS — Z87891 Personal history of nicotine dependence: Secondary | ICD-10-CM

## 2022-06-25 ENCOUNTER — Other Ambulatory Visit: Payer: Self-pay | Admitting: Emergency Medicine

## 2022-06-25 NOTE — Progress Notes (Signed)
Please notify patient that his CT chest scan did not show any suspicious pulmonary nodules, which is good! There are changes consistent with emphysema. He also has some scarring/changes that are consistent with possible interstitial lung disease. Given his breathing is stable, we can continue with surveillance CTs which can be further discussed at his follow up in September unless he has any new or worsening symptoms in the interim. He also has evidence of plaque buildup in his arteries/coronary artery disease - he's already followed by cardiology so follow up with them as scheduled. Thanks!

## 2022-06-25 NOTE — Progress Notes (Signed)
Spoke with pt and notified of results per Katie.  Pt verbalized understanding and denied any questions. 

## 2022-07-28 ENCOUNTER — Telehealth: Payer: Self-pay | Admitting: Emergency Medicine

## 2022-07-28 NOTE — Telephone Encounter (Signed)
I called the patient and he said he had changed a filter on the nebulizer machine and it was working for him at the moment. Nothing further needed.

## 2022-08-27 ENCOUNTER — Other Ambulatory Visit: Payer: Self-pay | Admitting: Nurse Practitioner

## 2022-08-27 ENCOUNTER — Telehealth: Payer: Self-pay | Admitting: Emergency Medicine

## 2022-08-27 NOTE — Telephone Encounter (Signed)
Patient called to request a refill for his Breztri from [facility] order pharmacy.  Please advise.

## 2022-08-28 ENCOUNTER — Other Ambulatory Visit: Payer: Self-pay | Admitting: Nurse Practitioner

## 2022-08-29 ENCOUNTER — Other Ambulatory Visit: Payer: Self-pay | Admitting: Emergency Medicine

## 2022-08-29 MED ORDER — BREZTRI AEROSPHERE 160-9-4.8 MCG/ACT IN AERO: 2.0000 | INHALATION_SPRAY | Freq: Two times a day (BID) | RESPIRATORY_TRACT | 3 refills | Status: DC

## 2022-08-29 NOTE — Telephone Encounter (Signed)
Rx for pt's Tristan Le has been sent to preferred pharmacy for pt. Called and spoke with pt letting him know this had been done and he verbalized understanding. Nothing further needed.

## 2022-09-10 ENCOUNTER — Ambulatory Visit: Payer: Medicare HMO | Admitting: Emergency Medicine

## 2022-11-09 ENCOUNTER — Other Ambulatory Visit: Payer: Self-pay | Admitting: Cardiovascular Disease

## 2022-11-14 ENCOUNTER — Telehealth: Payer: Self-pay | Admitting: Emergency Medicine

## 2022-11-14 MED ORDER — IPRATROPIUM-ALBUTEROL 0.5-2.5 (3) MG/3ML IN SOLN: RESPIRATORY_TRACT | 11 refills | Status: DC

## 2022-11-14 NOTE — Telephone Encounter (Signed)
Called and spoke with pt to confirm that he wanted to have his neb solution sent to [facility]ME pharmacy and he said yes. Rx has been sent to [facility] pharmacy for pt. Nothing further needed.

## 2022-12-05 ENCOUNTER — Telehealth: Payer: Self-pay | Admitting: Emergency Medicine

## 2022-12-05 MED ORDER — IPRATROPIUM-ALBUTEROL 0.5-2.5 (3) MG/3ML IN SOLN: 3.0000 mL | Freq: Four times a day (QID) | RESPIRATORY_TRACT | 11 refills | Status: DC | PRN

## 2022-12-05 NOTE — Telephone Encounter (Signed)
Called and spoke with pt about the neb solution. Pt stated he wanted to have a new Rx sent to [facility] so Rx has been sent. Nothing further needed.

## 2022-12-08 ENCOUNTER — Ambulatory Visit: Payer: Medicare HMO | Attending: Cardiovascular Disease | Admitting: Cardiovascular Disease

## 2022-12-08 ENCOUNTER — Encounter: Payer: Self-pay | Admitting: Cardiovascular Disease

## 2022-12-08 VITALS — BP 128/64 | HR 88 | Ht 72.0 in | Wt 189.0 lb

## 2022-12-08 DIAGNOSIS — R0609 Other forms of dyspnea: Secondary | ICD-10-CM

## 2022-12-08 DIAGNOSIS — E782 Mixed hyperlipidemia: Secondary | ICD-10-CM | POA: Diagnosis not present

## 2022-12-08 NOTE — Patient Instructions (Signed)
Medication Instructions:  Your physician recommends that you continue on your current medications as directed. Please refer to the Current Medication list given to you today.  *If you need a refill on your cardiac medications before your next appointment, please call your pharmacy*   Lab Work: NONE If you have labs (blood work) drawn today and your tests are completely normal, you will receive your results only by: MyChart Message (if you have MyChart) OR A paper copy in the mail If you have any lab test that is abnormal or we need to change your treatment, we will call you to review the results.   Testing/Procedures: NONE   Follow-Up: At [facility], you and your health needs are our priority.  As part of our continuing mission to provide you with exceptional heart care, we have created designated Provider Care Teams.  These Care Teams include your primary Cardiologist (physician) and Advanced Practice Providers (APPs -  Physician Assistants and Nurse Practitioners) who all work together to provide you with the care you need, when you need it.  We recommend signing up for the patient portal called "MyChart".  Sign up information is provided on this After Visit Summary.  MyChart is used to connect with patients for Virtual Visits (Telemedicine).  Patients are able to view lab/test results, encounter notes, upcoming appointments, etc.  Non-urgent messages can be sent to your provider as well.   To learn more about what you can do with MyChart, go to [email or web address].    Your next appointment:   06/10/23 @ 11:40pm  The format for your next appointment:   In Person  Provider:   Kristeen Miss, MD       Important Information About Sugar

## 2022-12-08 NOTE — Progress Notes (Signed)
Cardiology Office Note:    Date:  12/08/2022   ID:  Tristan Le, DOB 1941/08/23, MRN [id number]  PCP:  Alden Hipp, PA  Cardiologist:  Venesha Petraitis  Electrophysiologist:  None   Referring MD: Alden Hipp, PA   Chief Complaint  Patient presents with   Hyperlipidemia    History of Present Illness:    Tristan Le is a 81 y.o. male with a hx of   progressive dyspnea.   Thought to be due to COPD but PFTs are normal .   We were asked to see him today by Dr. Delton Coombes for further evaluation of the shortness of breath.  Dyspnea started 6-7 months ago .  He quit smoking when he became so short of breath. No episodes of chest pain or chest pressure.  He does not get any regular exercise. No yard work .   Now gets fatigued with walking to his car   Has never really been one to exercise,  Was active.   PFT's in Aug.  2020 were normal .   Wife has dementia - he is here primarily to ensure that he is healthy enough to take care of his wife.   Has not slept well for the past 3 months (corresponds to acute worsening of his wife's dementia  Gets 2-3 hours of sleep at night, then tries to get a nap .   Nov. 28, 2022 Tristan Le is seen today for follow up of his dyspnea Echocardiogram from October, 2020 reveals normal left ventricular systolic function.  He has grade 1 diastolic dysfunction. 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 Myoview study on October 11, 2019 with low risk.  There is no evidence of ischemia.  He had normal left ventricular systolic function.  His wife has worsening /progressive dementia His one hope is that he can stay alive long enough so that he can care for her .  Has been exercising more.   Walks in the house  Not as much recently due to his wife's progressive decline .  Labs from care everywhere show labs from his primary medical doctor. [facility]: Cholesterol level is 115 Triglyceride level is 91 HDL is 53 LDL is 45 We  have started him on Repatha  Ok to refill    Dec. 11, 2023 [name] is seen for follow-up of his shortness of breath and hyperlipidemia. He has chronic diastolic CHF. Depressed , Caring for his wife who is rapidly declining health. He does not think she will make it another weak .   Is eating ok.    Past Medical History:  Diagnosis Date   Arthritis    Chest pain    COPD (chronic obstructive pulmonary disease) (HCC) 05/18/2019   Cough 05/18/2019   06/13/2019-AFB-negative Fungal culture-showing yeast Respiratory sputum culture-negative    GERD (gastroesophageal reflux disease)    Hyperlipidemia    Hypertension     No past surgical history on file.  Current Medications: Current Meds  Medication Sig   albuterol (VENTOLIN HFA) 108 (90 Base) MCG/ACT inhaler INHALE 1 TO 2 PUFFS EVERY DAY INTO THE LUNGS AS NEEDED FOR SHORTNESS OF BREATH   azithromycin (ZITHROMAX) 250 MG tablet TAKE 1 TABLET EVERY DAY   Budeson-Glycopyrrol-Formoterol (BREZTRI AEROSPHERE) 160-9-4.8 MCG/ACT AERO Inhale 2 puffs into the lungs in the morning and at bedtime.   diltiazem (CARDIZEM CD) 240 MG 24 hr capsule TAKE 1 CAPSULE EVERY DAY   EPINEPHrine 0.3 mg/0.3 mL IJ SOAJ injection Inject  0.3 mg into the muscle as needed for anaphylaxis.   guaiFENesin (MUCINEX) 600 MG 12 hr tablet Take 1 tablet by mouth 2 (two) times daily.    hydrochlorothiazide (MICROZIDE) 12.5 MG capsule Take 1 capsule (12.5 mg total) by mouth daily.   ipratropium-albuterol (DUONEB) 0.5-2.5 (3) MG/3ML SOLN Take 3 mLs by nebulization every 6 (six) hours as needed.   predniSONE (DELTASONE) 5 MG tablet TAKE 1 TABLET EVERY DAY   REPATHA SURECLICK 140 MG/ML SOAJ INJECT 1 PEN INTO THE SKIN EVERY 14 (FOURTEEN) DAYS.   Respiratory Therapy Supplies (FLUTTER) DEVI Use as directed     Allergies:   Cefuroxime, Penicillins, and Sulfonamide derivatives   Social History   Socioeconomic History   Marital status: Married    Spouse name: Not on file   Number  of children: 4   Years of education: Not on file   Highest education level: Not on file  Occupational History   Occupation: Retired  Tobacco Use   Smoking status: Former    Packs/day: 0.50    Years: 60.00    Total pack years: 30.00    Types: Cigarettes    Quit date: 04/27/2019    Years since quitting: 3.6   Smokeless tobacco: Never  Vaping Use   Vaping Use: Never used  Substance and Sexual Activity   Alcohol use: No   Drug use: No   Sexual activity: Not on file  Other Topics Concern   Not on file  Social History Narrative   Not on file   Social Determinants of Health   Financial Resource Strain: Not on file  Food Insecurity: Not on file  Transportation Needs: Not on file  Physical Activity: Not on file  Stress: Not on file  Social Connections: Not on file     Family History: The patient's family history includes Heart disease in his brother and father; Hyperlipidemia in his brother.  ROS:   Please see the history of present illness.     All other systems reviewed and are negative.  EKGs/Labs/Other Studies Reviewed:    The following studies were reviewed today:   EKG:     Recent Labs: 12/29/2021: BUN 20; Creatinine, Ser 0.89; Hemoglobin 15.8; Platelets 170; Potassium 3.6; Sodium 136  Recent Lipid Panel    Component Value Date/Time   CHOL 149 12/07/2019 0759   TRIG 172 (H) 12/07/2019 0759   HDL 43 12/07/2019 0759   CHOLHDL 3.5 12/07/2019 0759   CHOLHDL 6.0 08/10/2010 0157   VLDL 42 (H) 08/10/2010 0157   LDLCALC 77 12/07/2019 0759   LDLDIRECT 83 12/07/2019 0759    Physical Exam:    Physical Exam: Blood pressure 128/64, pulse 88, height 6' (1.829 m), weight 189 lb (85.7 kg), SpO2 94 %.       GEN:  elderly male,   in no acute distress HEENT: Normal NECK: No JVD; No carotid bruits LYMPHATICS: No lymphadenopathy CARDIAC: RRR , no murmurs, rubs, gallops RESPIRATORY:  Clear to auscultation without rales, wheezing or rhonchi  ABDOMEN: Soft, non-tender,  non-distended MUSCULOSKELETAL:  No edema; No deformity  SKIN: Warm and dry NEUROLOGIC:  Alert and oriented x 3   ASSESSMENT:    1. DOE (dyspnea on exertion)   2. Mixed hyperlipidemia      PLAN:       Shortness of breath:  seems to be stable  He is not been exercising quite as much.  He has been taking care of his critically ill wife.  Unfortunately, he does  not think that she will make it another week or so.  I suspect some of his shortness of breath is due to deconditioning.    2.  Hyperlipidemia:  well controlled.  Continue current medications.  Medication Adjustments/Labs and Tests Ordered: Current medicines are reviewed at length with the patient today.  Concerns regarding medicines are outlined above.  No orders of the defined types were placed in this encounter.   No orders of the defined types were placed in this encounter.     Patient Instructions  Medication Instructions:  Your physician recommends that you continue on your current medications as directed. Please refer to the Current Medication list given to you today.  *If you need a refill on your cardiac medications before your next appointment, please call your pharmacy*   Lab Work: NONE If you have labs (blood work) drawn today and your tests are completely normal, you will receive your results only by: MyChart Message (if you have MyChart) OR A paper copy in the mail If you have any lab test that is abnormal or we need to change your treatment, we will call you to review the results.   Testing/Procedures: NONE   Follow-Up: At [facility], you and your health needs are our priority.  As part of our continuing mission to provide you with exceptional heart care, we have created designated Provider Care Teams.  These Care Teams include your primary Cardiologist (physician) and Advanced Practice Providers (APPs -  Physician Assistants and Nurse Practitioners) who all work together to provide  you with the care you need, when you need it.  We recommend signing up for the patient portal called "MyChart".  Sign up information is provided on this After Visit Summary.  MyChart is used to connect with patients for Virtual Visits (Telemedicine).  Patients are able to view lab/test results, encounter notes, upcoming appointments, etc.  Non-urgent messages can be sent to your provider as well.   To learn more about what you can do with MyChart, go to [email or web address].    Your next appointment:   06/10/23 @ 11:40pm  The format for your next appointment:   In Person  Provider:   Kristeen Miss, MD       Important Information About Sugar         Signed, Kristeen Miss, MD  12/08/2022 4:33 PM     [facility]

## 2022-12-19 ENCOUNTER — Other Ambulatory Visit: Payer: Self-pay | Admitting: Nurse Practitioner

## 2023-01-07 ENCOUNTER — Other Ambulatory Visit (HOSPITAL_COMMUNITY): Payer: Self-pay

## 2023-01-10 ENCOUNTER — Other Ambulatory Visit: Payer: Self-pay | Admitting: Nurse Practitioner

## 2023-01-13 ENCOUNTER — Other Ambulatory Visit (HOSPITAL_BASED_OUTPATIENT_CLINIC_OR_DEPARTMENT_OTHER): Payer: Self-pay

## 2023-01-13 MED ORDER — AZITHROMYCIN 250 MG PO TABS: 250.0000 mg | ORAL_TABLET | Freq: Every day | ORAL | 0 refills | Status: DC

## 2023-01-13 NOTE — Telephone Encounter (Signed)
He's on daily azithromycin. Ok to send one refill to get him through until he can be seen. He needs follow up with Dr. Lamonte Sakai and repeat EKG for medication monitoring before any further are sent. Thanks.

## 2023-02-04 ENCOUNTER — Other Ambulatory Visit: Payer: Self-pay | Admitting: Nurse Practitioner

## 2023-02-04 NOTE — Telephone Encounter (Signed)
He is overdue for follow up. Needs to have EKG with chronic azithromycin use. We can send enough tablets for him to get through until he has follow up with [place]. Thanks

## 2023-02-09 NOTE — Telephone Encounter (Signed)
ATC line busy, will try again later

## 2023-02-11 ENCOUNTER — Other Ambulatory Visit: Payer: Self-pay | Admitting: Emergency Medicine

## 2023-02-11 DIAGNOSIS — J410 Simple chronic bronchitis: Secondary | ICD-10-CM

## 2023-02-24 ENCOUNTER — Ambulatory Visit: Payer: Medicare HMO | Admitting: Nurse Practitioner

## 2023-03-02 ENCOUNTER — Ambulatory Visit: Payer: Medicare HMO | Admitting: Nurse Practitioner

## 2023-03-02 ENCOUNTER — Encounter: Payer: Self-pay | Admitting: Nurse Practitioner

## 2023-03-02 VITALS — BP 112/64 | HR 87 | Ht 72.0 in | Wt 186.2 lb

## 2023-03-02 DIAGNOSIS — Z79899 Other long term (current) drug therapy: Secondary | ICD-10-CM | POA: Diagnosis not present

## 2023-03-02 DIAGNOSIS — J449 Chronic obstructive pulmonary disease, unspecified: Secondary | ICD-10-CM | POA: Diagnosis not present

## 2023-03-02 DIAGNOSIS — Z87891 Personal history of nicotine dependence: Secondary | ICD-10-CM | POA: Diagnosis not present

## 2023-03-02 DIAGNOSIS — J849 Interstitial pulmonary disease, unspecified: Secondary | ICD-10-CM

## 2023-03-02 MED ORDER — AZITHROMYCIN 250 MG PO TABS: 250.0000 mg | ORAL_TABLET | Freq: Every day | ORAL | 2 refills | Status: DC

## 2023-03-02 NOTE — Assessment & Plan Note (Signed)
Nonspecific changes on CT imaging. Possible underlying ILD. He does have restrictive lung disease on previous testing. Would prefer to hold off on further workup at this time.

## 2023-03-02 NOTE — Patient Instructions (Addendum)
-  Continue Breztri inhaler 2 puffs Twice daily. Brush tongue and rinse mouth afterwards -Continue Albuterol inhaler 2 puffs or duoneb 3 mL neb every 6 hours as needed for shortness of breath or wheezing. Notify if symptoms persist despite rescue inhaler/neb use. -Continue daily prednisone 5 mg  -Continue mucinex 600 mg Twice daily  -Continue azithromycin 250 mg daily  -Continue flutter valve as needed -Continue to carry Epi pen with you  Get your RSV vaccine    Follow up with Dr. Lamonte Sakai in 3 months. If symptoms do not improve or worsen, please contact office for sooner follow up or seek emergency care.

## 2023-03-02 NOTE — Progress Notes (Signed)
$'@Patient's$  ID: Tristan Le, male    DOB: [birth year], 82 y.o.   MRN: [id number]  Chief Complaint  Patient presents with   Follow-up    Pt f/u he states that he breathing has been good.    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  He is a patient of Dr. Agustina Caroli and last seen in office on 06/06/2022.  Past medical history significant for hypertension, HLD, arthritis  TEST/EVENTS:  07/13/2019 CXR 2 view: Calcified right hilar lymph nodes.  Emphysema is present.  No acute abnormality present. 06/20/2022 LDCT chest: atherosclerosis. Coarsely calcified subcarinal and right hilar nodes, compatible with prior granulomatous disease. Moderate emphysema. No acute process. Calcified subcm basilar RLL granuloma. Mild to moderate patchy subpleural reticulation and ground-glass opacity throughout both lungs. Lung RADS 1  04/04/2022: OV with Dr. Lamonte Sakai.  Previously on clarithromycin, doxycycline and cefuroxime.  Had an acute reaction after taking cefuroxime with itching and near syncope.  Treated in the ED and was advised to stop this.  He then had a similar experience when he tried to take doxycycline in February so he stopped after 1 dose.  Recommended stopping rotating Doxy and clarithromycin.  Started azithromycin 250 mg daily.  Continued on 5 mg prednisone daily.  Continued on Breztri.  Follow-up 2 months.  06/06/2022: OV with Angelita Harnack NP for follow up. His breathing has been stable since we saw him last. Still has some DOE but it is at baseline. Still has an occasional chronic cough and wheezing. He has had no issues with the azithromycin. He denies fevers, night sweats, hemoptysis, anorexia, weight loss. He continues on [place] Twice daily and prednisone 5 mg. Rarely uses albuterol.   03/02/2023: Today - follow up Patient presents today for overdue follow up. He has been doing ok since he  was here last. Breathing has been stable. No flares requiring abx or steroids. No hospitalizations. He's able to complete ADLs without difficulties for the most part. No increased cough, chest congestion or wheezing. Occasional use of rescue; not having to use daily. He had a LDCT for lung cancer screening. Had some nonspecific changes but unable to rule out ILD.   Allergies  Allergen Reactions   Cefuroxime Anaphylaxis and Itching   Penicillins Other (See Comments)    REACTION: Upset stomach,mouth ulcers,rash   Sulfonamide Derivatives Rash    REACTION: Rash with huge blisters    Immunization History  Administered Date(s) Administered   Influenza,inj,Quad PF,6+ Mos 09/30/2021   PFIZER Comirnaty(Gray Top)Covid-19 Tri-Sucrose Vaccine 03/30/2021   PFIZER(Purple Top)SARS-COV-2 Vaccination 02/24/2020, 03/20/2020, 10/05/2020    Past Medical History:  Diagnosis Date   Arthritis    Chest pain    COPD (chronic obstructive pulmonary disease) ([place]) 05/18/2019   Cough 05/18/2019   06/13/2019-AFB-negative Fungal culture-showing yeast Respiratory sputum culture-negative    GERD (gastroesophageal reflux disease)    Hyperlipidemia    Hypertension     Tobacco History: Social History   Tobacco Use  Smoking Status Former   Packs/day: 0.50   Years: 60.00   Total pack years: 30.00   Types: Cigarettes   Quit date: 04/27/2019   Years since quitting: 3.8  Smokeless Tobacco Never   Counseling given: Not Answered   Outpatient Medications Prior to Visit  Medication Sig Dispense Refill   albuterol (VENTOLIN HFA) 108 (90 Base) MCG/ACT inhaler INHALE 1 TO 2 PUFFS EVERY DAY AS NEEDED FOR SHORTNESS OF  BREATH 1 each 3   Budeson-Glycopyrrol-Formoterol (BREZTRI AEROSPHERE) 160-9-4.8 MCG/ACT AERO Inhale 2 puffs into the lungs in the morning and at bedtime. 32.1 g 3   diltiazem (CARDIZEM CD) 240 MG 24 hr capsule TAKE 1 CAPSULE EVERY DAY 90 capsule 10   EPINEPHrine 0.3 mg/0.3 mL IJ SOAJ injection Inject 0.3  mg into the muscle as needed for anaphylaxis. 1 each 0   guaiFENesin (MUCINEX) 600 MG 12 hr tablet Take 1 tablet by mouth 2 (two) times daily.      hydrochlorothiazide (MICROZIDE) 12.5 MG capsule Take 1 capsule (12.5 mg total) by mouth daily. 30 capsule 0   ipratropium-albuterol (DUONEB) 0.5-2.5 (3) MG/3ML SOLN Take 3 mLs by nebulization every 6 (six) hours as needed. 360 mL 11   predniSONE (DELTASONE) 5 MG tablet TAKE 1 TABLET EVERY DAY 90 tablet 3   REPATHA SURECLICK XX123456 MG/ML SOAJ INJECT 1 PEN INTO THE SKIN EVERY 14 (FOURTEEN) DAYS. 6 mL 3   Respiratory Therapy Supplies (FLUTTER) DEVI Use as directed 1 each 0   azithromycin (ZITHROMAX) 250 MG tablet TAKE 1 TABLET EVERY DAY 30 tablet 3   azithromycin (ZITHROMAX) 250 MG tablet Take 1 tablet (250 mg total) by mouth daily. 30 tablet 0   No facility-administered medications prior to visit.     Review of Systems:   Constitutional: No weight loss or gain, night sweats, fevers, chills, fatigue, or lassitude. HEENT: No headaches, difficulty swallowing, tooth/dental problems, or sore throat. No sneezing, itching, ear ache, nasal congestion, or post nasal drip CV:  No chest pain, orthopnea, PND, swelling in lower extremities, anasarca, dizziness, palpitations, syncope Resp: +shortness of breath with exertion (stable); occasional cough with small amount of clear sputum (unchanged from baseline); occasional wheeze (unchanged from baseline). No excess mucus or change in color of mucus.  No hemoptysis. No chest wall deformity GI:  No heartburn, indigestion, abdominal pain, nausea, vomiting, diarrhea, change in bowel habits, loss of appetite, bloody stools.  GU: No dysuria, change in color of urine, urgency or frequency.   Skin: No rash, lesions, ulcerations MSK:  No joint pain or swelling.  Neuro: No dizziness or lightheadedness.  Psych: No depression or anxiety. Mood stable.     Physical Exam:  BP 112/64   Pulse 87   Ht 6' (1.829 m)   Wt 186  lb 3.2 oz (84.5 kg)   SpO2 95% Comment: RA  BMI 25.25 kg/m   GEN: Pleasant, interactive, well-nourished; in no acute distress. HEENT:  Normocephalic and atraumatic. PERRLA. Sclera white. Nasal turbinates pink, moist and patent bilaterally. No rhinorrhea present. Oropharynx pink and moist, without exudate or edema. No lesions, ulcerations, or postnasal drip.  NECK:  Supple w/ fair ROM. No JVD present. Normal carotid impulses w/o bruits. Thyroid symmetrical with no goiter or nodules palpated. No lymphadenopathy.   CV: RRR, no m/r/g, no peripheral edema. Pulses intact, +2 bilaterally. No cyanosis, pallor or clubbing. PULMONARY:  Unlabored, regular breathing. Diminished bilaterally with minimal end expiratory wheeze; resolved with deep breathing. No accessory muscle use. No dullness to percussion. GI: BS present and normoactive. Soft, non-tender to palpation. No organomegaly or masses detected.  MSK: No erythema, warmth or tenderness. Cap refil <2 sec all extrem. No deformities or joint swelling noted.  Neuro: A/Ox3. No focal deficits noted.   Skin: Warm, no lesions or rashe Psych: Normal affect and behavior. Judgement and thought content appropriate.     Lab Results:  CBC    Component Value Date/Time   WBC 19.0 (  H) 12/29/2021 1317   RBC 4.43 12/29/2021 1317   HGB 15.8 12/29/2021 1317   HGB 15.5 10/06/2019 1107   HCT 47.1 12/29/2021 1317   HCT 44.0 10/06/2019 1107   PLT 170 12/29/2021 1317   PLT 212 10/06/2019 1107   MCV 106.3 (H) 12/29/2021 1317   MCV 98 (H) 10/06/2019 1107   MCH 35.7 (H) 12/29/2021 1317   MCHC 33.5 12/29/2021 1317   RDW 12.3 12/29/2021 1317   RDW 11.9 10/06/2019 1107   LYMPHSABS 1.0 12/29/2021 1317   MONOABS 0.5 12/29/2021 1317   EOSABS 0.0 12/29/2021 1317   BASOSABS 0.0 12/29/2021 1317    BMET    Component Value Date/Time   NA 136 12/29/2021 1139   NA 137 10/06/2019 1107   K 3.6 12/29/2021 1139   CL 106 12/29/2021 1139   CO2 20 (L) 12/29/2021 1139    GLUCOSE 129 (H) 12/29/2021 1139   BUN 20 12/29/2021 1139   BUN 18 10/06/2019 1107   CREATININE 0.89 12/29/2021 1139   CALCIUM 8.4 (L) 12/29/2021 1139   GFRNONAA >60 12/29/2021 1139   GFRAA 75 10/06/2019 1107    BNP No results found for: "BNP"   Imaging:  No results found.       Latest Ref Rng & Units 08/16/2019    8:42 AM  PFT Results  FVC-Pre L 4.29  P  FVC-Predicted Pre % 91  P  FVC-Post L 4.09  P  FVC-Predicted Post % 86  P  Pre FEV1/FVC % % 76  P  Post FEV1/FCV % % 81  P  FEV1-Pre L 3.28  P  FEV1-Predicted Pre % 96  P  FEV1-Post L 3.33  P  DLCO uncorrected ml/min/mmHg 21.13  P  DLCO UNC% % 77  P  DLVA Predicted % 88  P  TLC L 5.91  P  TLC % Predicted % 77  P  RV % Predicted % 60  P    P Preliminary result    No results found for: "NITRICOXIDE"      Assessment & Plan:   COPD (chronic obstructive pulmonary disease) (HCC) Compensated on current regimen. EKG today stable without QT prolongation. He will continue triple therapy regimen, chronic macrolide and prednisone therapy. Action plan in place.  Patient Instructions  -Continue Breztri inhaler 2 puffs Twice daily. Brush tongue and rinse mouth afterwards -Continue Albuterol inhaler 2 puffs or duoneb 3 mL neb every 6 hours as needed for shortness of breath or wheezing. Notify if symptoms persist despite rescue inhaler/neb use. -Continue daily prednisone 5 mg  -Continue mucinex 600 mg Twice daily  -Continue azithromycin 250 mg daily  -Continue flutter valve as needed -Continue to carry Epi pen with you  Get your RSV vaccine    Follow up with Dr. Lamonte Sakai in 3 months. If symptoms do not improve or worsen, please contact office for sooner follow up or seek emergency care.   ILD (interstitial lung disease) ([place]) Nonspecific changes on CT imaging. Possible underlying ILD. He does have restrictive lung disease on previous testing. Would prefer to hold off on further workup at this time.   History of  tobacco abuse LDCT chest June 2023 with Lung RADS 1. He has aged out now. Will monitor his symptoms and obtain repeat imaging as indicated.    Clayton Bibles, NP 03/02/2023  Pt aware and understands NP's role.

## 2023-03-02 NOTE — Assessment & Plan Note (Signed)
LDCT chest June 2023 with Lung RADS 1. He has aged out now. Will monitor his symptoms and obtain repeat imaging as indicated.

## 2023-03-02 NOTE — Assessment & Plan Note (Signed)
Compensated on current regimen. EKG today stable without QT prolongation. He will continue triple therapy regimen, chronic macrolide and prednisone therapy. Action plan in place.  Patient Instructions  -Continue Breztri inhaler 2 puffs Twice daily. Brush tongue and rinse mouth afterwards -Continue Albuterol inhaler 2 puffs or duoneb 3 mL neb every 6 hours as needed for shortness of breath or wheezing. Notify if symptoms persist despite rescue inhaler/neb use. -Continue daily prednisone 5 mg  -Continue mucinex 600 mg Twice daily  -Continue azithromycin 250 mg daily  -Continue flutter valve as needed -Continue to carry Epi pen with you  Get your RSV vaccine    Follow up with Dr. Lamonte Sakai in 3 months. If symptoms do not improve or worsen, please contact office for sooner follow up or seek emergency care.

## 2023-03-03 NOTE — Progress Notes (Signed)
Agree with plans

## 2023-04-11 ENCOUNTER — Other Ambulatory Visit: Payer: Self-pay | Admitting: Cardiovascular Disease

## 2023-05-15 ENCOUNTER — Other Ambulatory Visit: Payer: Self-pay | Admitting: Nurse Practitioner

## 2023-05-15 DIAGNOSIS — J449 Chronic obstructive pulmonary disease, unspecified: Secondary | ICD-10-CM

## 2023-06-09 ENCOUNTER — Encounter: Payer: Self-pay | Admitting: Cardiovascular Disease

## 2023-06-09 NOTE — Progress Notes (Unsigned)
Cardiology Office Note:    Date:  06/10/2023   ID:  Tristan Le, DOB 1941/08/22, MRN [id number]  PCP:  Alden Hipp, PA  Cardiologist:  Jamison Yuhasz  Electrophysiologist:  None   Referring MD: Alden Hipp, PA   Chief Complaint  Patient presents with   Hyperlipidemia    History of Present Illness:    Tristan Le is a 82 y.o. male with a hx of   progressive dyspnea.   Thought to be due to COPD but PFTs are normal .   We were asked to see him today by Dr. Delton Coombes for further evaluation of the shortness of breath.  Dyspnea started 6-7 months ago .  He quit smoking when he became so short of breath. No episodes of chest pain or chest pressure.  He does not get any regular exercise. No yard work .   Now gets fatigued with walking to his car   Has never really been one to exercise,  Was active.   PFT's in Aug.  2020 were normal .   Wife has dementia - he is here primarily to ensure that he is healthy enough to take care of his wife.   Has not slept well for the past 3 months (corresponds to acute worsening of his wife's dementia  Gets 2-3 hours of sleep at night, then tries to get a nap .   Nov. 28, 2022 Tristan Le is seen today for follow up of his dyspnea Echocardiogram from October, 2020 reveals normal left ventricular systolic function.  He has grade 1 diastolic dysfunction. 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 Myoview study on October 11, 2019 with low risk.  There is no evidence of ischemia.  He had normal left ventricular systolic function.  His wife has worsening /progressive dementia His one hope is that he can stay alive long enough so that he can care for her .  Has been exercising more.   Walks in the house  Not as much recently due to his wife's progressive decline .  Labs from care everywhere show labs from his primary medical doctor. [facility]: Cholesterol level is 115 Triglyceride level is 91 HDL is 53 LDL is 45 We  have started him on Repatha  Ok to refill    Dec. 11, 2023 Tristan Le is seen for follow-up of his shortness of breath and hyperlipidemia. He has chronic diastolic CHF. Depressed , Caring for his wife who is rapidly declining health. He does not think she will make it another weak .   Is eating ok.    June 10, 2023 Tristan Le is seen for follow up of his hyperlipidemia and shortness of breath . Occasional dyspnea   Echo from Oct. 2020 shows normal LV systolic function Has grade I DD   His wife has passed away since I last saw him Is understandably depressed. Is getting counseling for his depression.     Past Medical History:  Diagnosis Date   Arthritis    Chest pain    COPD (chronic obstructive pulmonary disease) (HCC) 05/18/2019   Cough 05/18/2019   06/13/2019-AFB-negative Fungal culture-showing yeast Respiratory sputum culture-negative    GERD (gastroesophageal reflux disease)    Hyperlipidemia    Hypertension     History reviewed. No pertinent surgical history.  Current Medications: Current Meds  Medication Sig   albuterol (VENTOLIN HFA) 108 (90 Base) MCG/ACT inhaler INHALE 1 TO 2 PUFFS EVERY DAY AS NEEDED FOR SHORTNESS OF BREATH  Ascorbic Acid (VITAMIN C) 1000 MG tablet Take 1,000 mg by mouth daily.   azithromycin (ZITHROMAX) 250 MG tablet TAKE 1 TABLET EVERY DAY   Budeson-Glycopyrrol-Formoterol (BREZTRI AEROSPHERE) 160-9-4.8 MCG/ACT AERO Inhale 2 puffs into the lungs in the morning and at bedtime.   Cyanocobalamin (VITAMIN B 12 PO) Take 1 tablet by mouth daily. Pt unsure of strength   diltiazem (CARDIZEM CD) 240 MG 24 hr capsule TAKE 1 CAPSULE EVERY DAY   EPINEPHrine 0.3 mg/0.3 mL IJ SOAJ injection Inject 0.3 mg into the muscle as needed for anaphylaxis.   guaiFENesin (MUCINEX) 600 MG 12 hr tablet Take 1 tablet by mouth 2 (two) times daily.    hydrochlorothiazide (MICROZIDE) 12.5 MG capsule Take 1 capsule (12.5 mg total) by mouth daily.   ipratropium-albuterol  (DUONEB) 0.5-2.5 (3) MG/3ML SOLN Take 3 mLs by nebulization every 6 (six) hours as needed.   predniSONE (DELTASONE) 5 MG tablet TAKE 1 TABLET EVERY DAY   REPATHA SURECLICK 140 MG/ML SOAJ INJECT 1 PEN (140MG ) UNDER THE SKIN EVERY 14 DAYS   Respiratory Therapy Supplies (FLUTTER) DEVI Use as directed   Vitamin D, Ergocalciferol, (DRISDOL) 1.25 MG (50000 UNIT) CAPS capsule Take 5,000 Units by mouth every 7 (seven) days.     Allergies:   Cefuroxime, Penicillins, and Sulfonamide derivatives   Social History   Socioeconomic History   Marital status: Married    Spouse name: Not on file   Number of children: 4   Years of education: Not on file   Highest education level: Not on file  Occupational History   Occupation: Retired  Tobacco Use   Smoking status: Former    Packs/day: 0.50    Years: 60.00    Additional pack years: 0.00    Total pack years: 30.00    Types: Cigarettes    Quit date: 04/27/2019    Years since quitting: 4.1   Smokeless tobacco: Never  Vaping Use   Vaping Use: Never used  Substance and Sexual Activity   Alcohol use: No   Drug use: No   Sexual activity: Not on file  Other Topics Concern   Not on file  Social History Narrative   Not on file   Social Determinants of Health   Financial Resource Strain: Not on file  Food Insecurity: Not on file  Transportation Needs: Not on file  Physical Activity: Not on file  Stress: Not on file  Social Connections: Not on file     Family History: The patient's family history includes Heart disease in his brother and father; Hyperlipidemia in his brother.  ROS:   Please see the history of present illness.     All other systems reviewed and are negative.  EKGs/Labs/Other Studies Reviewed:    The following studies were reviewed today:   EKG:     Recent Labs: No results found for requested labs within last 365 days.  Recent Lipid Panel    Component Value Date/Time   CHOL 149 12/07/2019 0759   TRIG 172 (H)  12/07/2019 0759   HDL 43 12/07/2019 0759   CHOLHDL 3.5 12/07/2019 0759   CHOLHDL 6.0 08/10/2010 0157   VLDL 42 (H) 08/10/2010 0157   LDLCALC 77 12/07/2019 0759   LDLDIRECT 83 12/07/2019 0759    Physical Exam:    Physical Exam: Blood pressure 116/60, pulse 69, height 6' (1.829 m), weight 180 lb 9.6 oz (81.9 kg), SpO2 96 %.       GEN:  Well nourished, well developed in no  acute distress HEENT: Normal NECK: No JVD; No carotid bruits LYMPHATICS: No lymphadenopathy CARDIAC: RRR , no murmurs, rubs, gallops RESPIRATORY:  Clear to auscultation without rales, wheezing or rhonchi  ABDOMEN: Soft, non-tender, non-distended MUSCULOSKELETAL:  No edema; No deformity  SKIN: Warm and dry NEUROLOGIC:  Alert and oriented x 3   ASSESSMENT:    1. Chronic diastolic CHF (congestive heart failure) (HCC)   2. Chronic obstructive pulmonary disease, unspecified COPD type (HCC)       PLAN:       Shortness of breath:     2.  Hyperlipidemia:     Medication Adjustments/Labs and Tests Ordered: Current medicines are reviewed at length with the patient today.  Concerns regarding medicines are outlined above.  No orders of the defined types were placed in this encounter.   No orders of the defined types were placed in this encounter.     Patient Instructions  Medication Instructions:  Your physician recommends that you continue on your current medications as directed. Please refer to the Current Medication list given to you today.  *If you need a refill on your cardiac medications before your next appointment, please call your pharmacy*   Lab Work: None  If you have labs (blood work) drawn today and your tests are completely normal, you will receive your results only by: MyChart Message (if you have MyChart) OR A paper copy in the mail If you have any lab test that is abnormal or we need to change your treatment, we will call you to review the  results.   Testing/Procedures: None   Follow-Up: At [facility], you and your health needs are our priority.  As part of our continuing mission to provide you with exceptional heart care, we have created designated Provider Care Teams.  These Care Teams include your primary Cardiologist (physician) and Advanced Practice Providers (APPs -  Physician Assistants and Nurse Practitioners) who all work together to provide you with the care you need, when you need it.  Your next appointment:   1 year(s)  Provider:   Kristeen Miss, MD   Signed, Kristeen Miss, MD  06/10/2023 5:55 PM    [facility]

## 2023-06-10 ENCOUNTER — Encounter: Payer: Self-pay | Admitting: Cardiovascular Disease

## 2023-06-10 ENCOUNTER — Ambulatory Visit: Payer: Medicare HMO | Attending: Cardiovascular Disease | Admitting: Cardiovascular Disease

## 2023-06-10 VITALS — BP 116/60 | HR 69 | Ht 72.0 in | Wt 180.6 lb

## 2023-06-10 DIAGNOSIS — I5032 Chronic diastolic (congestive) heart failure: Secondary | ICD-10-CM

## 2023-06-10 DIAGNOSIS — J449 Chronic obstructive pulmonary disease, unspecified: Secondary | ICD-10-CM | POA: Diagnosis not present

## 2023-06-10 NOTE — Patient Instructions (Signed)
Medication Instructions:  Your physician recommends that you continue on your current medications as directed. Please refer to the Current Medication list given to you today.  *If you need a refill on your cardiac medications before your next appointment, please call your pharmacy*   Lab Work: None If you have labs (blood work) drawn today and your tests are completely normal, you will receive your results only by: MyChart Message (if you have MyChart) OR A paper copy in the mail If you have any lab test that is abnormal or we need to change your treatment, we will call you to review the results.   Testing/Procedures: None   Follow-Up: At [facility], you and your health needs are our priority.  As part of our continuing mission to provide you with exceptional heart care, we have created designated Provider Care Teams.  These Care Teams include your primary Cardiologist (physician) and Advanced Practice Providers (APPs -  Physician Assistants and Nurse Practitioners) who all work together to provide you with the care you need, when you need it.   Your next appointment:   1 year(s)  Provider:   Philip Nahser, MD   

## 2023-06-27 ENCOUNTER — Other Ambulatory Visit: Payer: Self-pay | Admitting: Emergency Medicine

## 2023-07-07 ENCOUNTER — Telehealth: Payer: Self-pay | Admitting: Emergency Medicine

## 2023-07-07 DIAGNOSIS — J449 Chronic obstructive pulmonary disease, unspecified: Secondary | ICD-10-CM

## 2023-07-07 NOTE — Telephone Encounter (Signed)
Pt called in to get his Albuterol nebulizer sol  Pharm[facility]: [facility]

## 2023-07-08 MED ORDER — IPRATROPIUM-ALBUTEROL 0.5-2.5 (3) MG/3ML IN SOLN: 3.0000 mL | RESPIRATORY_TRACT | 11 refills | Status: DC | PRN

## 2023-07-08 NOTE — Addendum Note (Signed)
Addended by: Glynda Jaeger on: 07/08/2023 04:45 PM   Modules accepted: Orders

## 2023-07-08 NOTE — Telephone Encounter (Signed)
Called and spoke with pt, duoneb has been sent to pharmacy

## 2023-07-08 NOTE — Telephone Encounter (Signed)
PT calling again for his Albuterol to be called in. Sending back High priority due to he is almost out and two days have passed. TY.

## 2023-07-14 NOTE — Addendum Note (Signed)
Addended by: Delrae Rend on: 07/14/2023 03:38 PM   Modules accepted: Orders

## 2023-07-16 ENCOUNTER — Telehealth: Payer: Self-pay | Admitting: Emergency Medicine

## 2023-07-16 DIAGNOSIS — J449 Chronic obstructive pulmonary disease, unspecified: Secondary | ICD-10-CM

## 2023-07-16 NOTE — Telephone Encounter (Signed)
I received a message from [place] and Terri with [facility]  This patient rcvd his DuoNeb on 07/10/2023.Marland KitchenMarland KitchenMarland KitchenIf he needs plain Albuterol we will need an order electronically submitted   an order can be e-scripted in EPIC  Pharmacy = " [facility]"  Albuterol nebulizer soln.   3ml                          frequency= QID/PRN                           quantity=  450 mls                             refills=  12 refills   If it is put in just like that we eill get electronically and shipped out tonight from our nebulized med warehouse.

## 2023-07-22 NOTE — Telephone Encounter (Signed)
Last order from Dr. Delton Coombes Note: Refill albuterol neb 2.5mg / 3L Dme : [facility]

## 2023-07-22 NOTE — Telephone Encounter (Signed)
Per chart patient is only on DuoNeb and not Albuterol solution.    Do not know how to reach Ashley/Terri. Could you please assist? Thanks!

## 2023-07-27 NOTE — Telephone Encounter (Signed)
Per LOV 03/02/23: -Continue Breztri inhaler 2 puffs Twice daily. Brush tongue and rinse mouth afterwards -Continue Albuterol inhaler 2 puffs or duoneb 3 mL neb every 6 hours as needed for shortness of breath or wheezing. Notify if symptoms persist despite rescue inhaler/neb use.     Patient chart has no history of albuterol solution for neb being prescribed. Per chart patient has been on DuoNeb for some time.   Please advise, thank you!

## 2023-07-28 ENCOUNTER — Telehealth: Payer: Self-pay | Admitting: Student

## 2023-07-28 MED ORDER — ALBUTEROL SULFATE (2.5 MG/3ML) 0.083% IN NEBU: 2.5000 mg | INHALATION_SOLUTION | Freq: Four times a day (QID) | RESPIRATORY_TRACT | 12 refills | Status: DC | PRN

## 2023-07-28 NOTE — Telephone Encounter (Signed)
PT is already on Duoneb with [facility]. Ms. Tristan Le RX's Albuterol and they say PT can not have both. Please call to clarify what NP wants.           Terri @ :[facility] [phone]

## 2023-07-31 NOTE — Telephone Encounter (Signed)
Spoke with [facility] regarding prior message.Advised [facility] that per Tristan Le was d/c and patient to take Albuterol.  Nothing else further needed at this time.

## 2023-08-19 ENCOUNTER — Ambulatory Visit (INDEPENDENT_AMBULATORY_CARE_PROVIDER_SITE_OTHER): Payer: Medicare HMO | Admitting: Emergency Medicine

## 2023-08-19 ENCOUNTER — Encounter: Payer: Self-pay | Admitting: Emergency Medicine

## 2023-08-19 VITALS — BP 126/66 | HR 81 | Ht 71.7 in | Wt 185.6 lb

## 2023-08-19 DIAGNOSIS — J449 Chronic obstructive pulmonary disease, unspecified: Secondary | ICD-10-CM

## 2023-08-19 NOTE — Progress Notes (Signed)
Subjective:    Patient ID: Tristan Le, male    DOB: 21-Jul-1941, 82 y.o.   MRN: [id number]  HPI  ROV 04/04/22 --82 year old gentleman with history of obesity, COPD, mixed obstruction and restriction on his pulmonary function testing.  Also limited somewhat by deconditioning.  He has a chronic bronchitic phenotype and has been on chronic prednisone 5 mg daily, rotating antibiotics.  I had him on clarithromycin, doxycycline and cefuroxime.  In January he had an acute reaction, itching, near syncope that was felt to be related to the cefuroxime.  He had to be seen and treated in the emergency department.  We stopped the cefuroxime and continue to cycle the doxycycline and clarithromycin. He had a similar experience when he tried to take doxycycline in February > hands turned bright red, so he stopped it after 1 dose Currently managed on Breztri.  Uses albuterol rarely. Uses DuoNeb 1-2x a day  On Mucinex twice daily, uses flutter valve as needed  ROV 08/19/2023 --follow-up visit for 82 year old gentleman with history of multifactorial shortness of breath.  He has obesity, COPD with mixed obstruction and restriction on his pulmonary function testing, also some deconditioning.  He has chronic bronchitis due to his COPD and has been on rotating antibiotics, chronic prednisone 5mg .  Currently scheduled azithromycin daily On Breztri, albuterol and DuoNeb available. Uses Duoneb bid. Occasional albuterol.  Unfortunately since last time his wife has passed away and he has had to deal with the grief from this.  He describes no flares since last time. Minimal cough, usually in the am with some. He has some exertional SOB when carrying objects, taking out trash. SOB w bending.     Review of Systems  Constitutional:  Negative for fever and unexpected weight change.  HENT:  Negative for congestion, dental problem, ear pain, nosebleeds, postnasal drip, rhinorrhea, sinus pressure, sneezing, sore throat and  trouble swallowing.   Eyes:  Negative for redness and itching.  Respiratory:  Positive for cough, shortness of breath and wheezing. Negative for chest tightness.   Cardiovascular:  Negative for chest pain, palpitations and leg swelling.  Gastrointestinal:  Negative for nausea and vomiting.  Genitourinary:  Negative for dysuria.  Musculoskeletal:  Negative for joint swelling.  Skin:  Negative for rash.  Neurological:  Negative for headaches.  Hematological:  Does not bruise/bleed easily.  Psychiatric/Behavioral:  Negative for dysphoric mood. The patient is not nervous/anxious.         Objective:   Physical Exam Vitals:   08/19/23 0923  BP: 126/66  Pulse: 81  SpO2: 97%  Weight: 185 lb 9.6 oz (84.2 kg)  Height: 5' 11.7" (1.821 m)    Gen: Pleasant, well-nourished, in no distress, somewhat stoic affect  ENT: No lesions,  mouth clear, edentulous, oropharynx clear, no postnasal drip  Neck: No JVD, no stridor  Lungs: No use of accessory muscles, rhonchi on the right.  Distant, no wheeze  Cardiovascular: RRR, heart sounds normal, no murmur or gallops, no peripheral edema  Musculoskeletal: No deformities, no cyanosis or clubbing  Neuro: alert, awake, non focal  Skin: Warm, no lesions or rash     Assessment & Plan:  COPD (chronic obstructive pulmonary disease) (HCC) Please continue Breztri 2 puffs twice a day.  Rinse and gargle after using. Continue your DuoNeb as you have been using it.  You can use it up to every 6 hours if needed for shortness of breath, chest tightness, wheezing. Keep albuterol available to use 2 puffs up  to every 4 hours if needed for shortness of breath, chest tightness, wheezing.  Continue azithromycin once daily every day Continue prednisone 5 mg once daily every day Get your flu shot and new COVID-19 vaccine this fall Follow with Dr. Delton Coombes in 12 months or sooner if you have any problems.    Levy Pupa, MD, PhD 08/19/2023, 9:37 AM [name] [phone] or if no answer [phone]

## 2023-08-19 NOTE — Patient Instructions (Signed)
Please continue Breztri 2 puffs twice a day.  Rinse and gargle after using. Continue your DuoNeb as you have been using it.  You can use it up to every 6 hours if needed for shortness of breath, chest tightness, wheezing. Keep albuterol available to use 2 puffs up to every 4 hours if needed for shortness of breath, chest tightness, wheezing.  Continue azithromycin once daily every day Continue prednisone 5 mg once daily every day Get your flu shot and new COVID-19 vaccine this fall Follow with Dr. Delton Coombes in 12 months or sooner if you have any problems.

## 2023-08-19 NOTE — Assessment & Plan Note (Signed)
Please continue Breztri 2 puffs twice a day.  Rinse and gargle after using. Continue your DuoNeb as you have been using it.  You can use it up to every 6 hours if needed for shortness of breath, chest tightness, wheezing. Keep albuterol available to use 2 puffs up to every 4 hours if needed for shortness of breath, chest tightness, wheezing.  Continue azithromycin once daily every day Continue prednisone 5 mg once daily every day Get your flu shot and new COVID-19 vaccine this fall Follow with Dr. Delton Coombes in 12 months or sooner if you have any problems.

## 2023-10-05 ENCOUNTER — Other Ambulatory Visit: Payer: Self-pay | Admitting: Emergency Medicine

## 2023-10-28 ENCOUNTER — Encounter: Payer: Self-pay | Admitting: Physician Assistant

## 2023-11-06 ENCOUNTER — Other Ambulatory Visit: Payer: Self-pay | Admitting: Medical Genetics

## 2023-11-06 DIAGNOSIS — Z006 Encounter for examination for normal comparison and control in clinical research program: Secondary | ICD-10-CM

## 2023-11-11 ENCOUNTER — Other Ambulatory Visit: Payer: Self-pay | Admitting: Cardiovascular Disease

## 2023-11-23 ENCOUNTER — Ambulatory Visit (INDEPENDENT_AMBULATORY_CARE_PROVIDER_SITE_OTHER): Payer: Medicare HMO | Admitting: Physician Assistant

## 2023-11-23 ENCOUNTER — Encounter: Payer: Self-pay | Admitting: Physician Assistant

## 2023-11-23 ENCOUNTER — Ambulatory Visit: Payer: Medicare HMO

## 2023-11-23 VITALS — BP 137/67 | HR 75 | Resp 18 | Ht 71.0 in | Wt 184.0 lb

## 2023-11-23 DIAGNOSIS — R413 Other amnesia: Secondary | ICD-10-CM | POA: Insufficient documentation

## 2023-11-23 NOTE — Patient Instructions (Addendum)
It was a pleasure to see you today at our office.   Recommendations:  Neurocognitive evaluation at our office   MRI of the brain, the radiology office will call you to arrange you appointment  [phone] at [facility] Follow up in 2 months     For psychiatric meds, mood meds: Please have your primary care physician manage these medications.  If you have any severe symptoms of a stroke, or other severe issues such as confusion,severe chills or fever, etc call 911 or go to the ER as you may need to be evaluated further   For guidance regarding [facility] and if placement were needed at the facility, contact Social Worker tel: [phone]  For assessment of decision of mental capacity and competency:  Call Dr. Erick Blinks, geriatric psychiatrist at [phone]  Counseling regarding caregiver distress, including caregiver depression, anxiety and issues regarding community resources, adult day care programs, adult living facilities, or memory care questions:  please contact your  Primary Doctor's Social Worker   Whom to call: Memory  decline, memory medications: Call our office [phone]    [email or web address]   RECOMMENDATIONS FOR ALL PATIENTS WITH MEMORY PROBLEMS: 1. Continue to exercise (Recommend 30 minutes of walking everyday, or 3 hours every week) 2. Increase social interactions - continue going to [employer] and enjoy social gatherings with friends and family 3. Eat healthy, avoid fried foods and eat more fruits and vegetables 4. Maintain adequate blood pressure, blood sugar, and blood cholesterol level. Reducing the risk of stroke and cardiovascular disease also helps promoting better memory. 5. Avoid stressful situations. Live a simple life and avoid aggravations. Organize your time and prepare for the next day in anticipation. 6. Sleep well, avoid any interruptions of sleep and avoid any distractions in the  bedroom that may interfere with adequate sleep quality 7. Avoid sugar, avoid sweets as there is a strong link between excessive sugar intake, diabetes, and cognitive impairment We discussed the Mediterranean diet, which has been shown to help patients reduce the risk of progressive memory disorders and reduces cardiovascular risk. This includes eating fish, eat fruits and green leafy vegetables, nuts like almonds and hazelnuts, walnuts, and also use olive oil. Avoid fast foods and fried foods as much as possible. Avoid sweets and sugar as sugar use has been linked to worsening of memory function.  There is always a concern of gradual progression of memory problems. If this is the case, then we may need to adjust level of care according to patient needs. Support, both to the patient and caregiver, should then be put into place.      You have been referred for a neuropsychological evaluation (i.e., evaluation of memory and thinking abilities). Please bring someone with you to this appointment if possible, as it is helpful for the doctor to hear from both you and another adult who knows you well. Please bring eyeglasses and hearing aids if you wear them.    The evaluation will take approximately 3 hours and has two parts:   The first part is a clinical interview with the neuropsychologist (Dr. Milbert Coulter or Dr. Roseanne Reno). During the interview, the neuropsychologist will speak with you and the individual you brought to the appointment.    The second part of the evaluation is testing with the doctor's technician Annabelle Harman or Selena Batten). During the testing, the technician will ask you to remember different types of material, solve problems, and answer some questionnaires. Your family member will not be present for this  portion of the evaluation.   Please note: We must reserve several hours of the neuropsychologist's time and the psychometrician's time for your evaluation appointment. As such, there is a No-Show fee of  $100. If you are unable to attend any of your appointments, please contact our office as soon as possible to reschedule.      DRIVING: Regarding driving, in patients with progressive memory problems, driving will be impaired. We advise to have someone else do the driving if trouble finding directions or if minor accidents are reported. Independent driving assessment is available to determine safety of driving.   If you are interested in the driving assessment, you can contact the following:  [facility] in [place] [phone]  [facility] [phone]  [facility] [phone]  [facility] [phone] or [phone]   FALL PRECAUTIONS: Be cautious when walking. Scan the area for obstacles that may increase the risk of trips and falls. When getting up in the mornings, sit up at the edge of the bed for a few minutes before getting out of bed. Consider elevating the bed at the head end to avoid drop of blood pressure when getting up. Walk always in a well-lit room (use night lights in the walls). Avoid area rugs or power cords from appliances in the middle of the walkways. Use a walker or a cane if necessary and consider physical therapy for balance exercise. Get your eyesight checked regularly.  FINANCIAL OVERSIGHT: Supervision, especially oversight when making financial decisions or transactions is also recommended.  HOME SAFETY: Consider the safety of the kitchen when operating appliances like stoves, microwave oven, and blender. Consider having supervision and share cooking responsibilities until no longer able to participate in those. Accidents with firearms and other hazards in the house should be identified and addressed as well.   ABILITY TO BE LEFT ALONE: If patient is unable to contact 911 operator, consider using LifeLine, or when the need is there, arrange for someone to stay with patients. Smoking is a fire hazard, consider  supervision or cessation. Risk of wandering should be assessed by caregiver and if detected at any point, supervision and safe proof recommendations should be instituted.  MEDICATION SUPERVISION: Inability to self-administer medication needs to be constantly addressed. Implement a mechanism to ensure safe administration of the medications.      Mediterranean Diet A Mediterranean diet refers to food and lifestyle choices that are based on the traditions of countries located on the [employer] [place]. This way of eating has been shown to help prevent certain conditions and improve outcomes for people who have chronic diseases, like kidney disease and heart disease. What are tips for following this plan? Lifestyle  Cook and eat meals together with your family, when possible. Drink enough fluid to keep your urine clear or pale yellow. Be physically active every day. This includes: Aerobic exercise like running or swimming. Leisure activities like gardening, walking, or housework. Get 7-8 hours of sleep each night. If recommended by your health care provider, drink red wine in moderation. This means 1 glass a day for nonpregnant women and 2 glasses a day for men. A glass of wine equals 5 oz (150 mL). Reading food labels  Check the serving size of packaged foods. For foods such as rice and pasta, the serving size refers to the amount of cooked product, not dry. Check the total fat in packaged foods. Avoid foods that have saturated fat or trans fats. Check the ingredients list for added  sugars, such as corn syrup. Shopping  At the grocery store, buy most of your food from the areas near the walls of the store. This includes: Fresh fruits and vegetables (produce). Grains, beans, nuts, and seeds. Some of these may be available in unpackaged forms or large amounts (in bulk). Fresh seafood. Poultry and eggs. Low-fat dairy products. Buy whole ingredients instead of prepackaged foods. Buy fresh  fruits and vegetables in-season from local farmers markets. Buy frozen fruits and vegetables in resealable bags. If you do not have access to quality fresh seafood, buy precooked frozen shrimp or canned fish, such as tuna, salmon, or sardines. Buy small amounts of raw or cooked vegetables, salads, or olives from the deli or salad bar at your store. Stock your pantry so you always have certain foods on hand, such as olive oil, canned tuna, canned tomatoes, rice, pasta, and beans. Cooking  Cook foods with extra-virgin olive oil instead of using butter or other vegetable oils. Have meat as a side dish, and have vegetables or grains as your main dish. This means having meat in small portions or adding small amounts of meat to foods like pasta or stew. Use beans or vegetables instead of meat in common dishes like chili or lasagna. Experiment with different cooking methods. Try roasting or broiling vegetables instead of steaming or sauteing them. Add frozen vegetables to soups, stews, pasta, or rice. Add nuts or seeds for added healthy fat at each meal. You can add these to yogurt, salads, or vegetable dishes. Marinate fish or vegetables using olive oil, lemon juice, garlic, and fresh herbs. Meal planning  Plan to eat 1 vegetarian meal one day each week. Try to work up to 2 vegetarian meals, if possible. Eat seafood 2 or more times a week. Have healthy snacks readily available, such as: Vegetable sticks with hummus. Greek yogurt. Fruit and nut trail mix. Eat balanced meals throughout the week. This includes: Fruit: 2-3 servings a day Vegetables: 4-5 servings a day Low-fat dairy: 2 servings a day Fish, poultry, or lean meat: 1 serving a day Beans and legumes: 2 or more servings a week Nuts and seeds: 1-2 servings a day Whole grains: 6-8 servings a day Extra-virgin olive oil: 3-4 servings a day Limit red meat and sweets to only a few servings a month What are my food choices? Mediterranean  diet Recommended Grains: Whole-grain pasta. Brown rice. Bulgar wheat. Polenta. Couscous. Whole-wheat bread. Orpah Cobb. Vegetables: Artichokes. Beets. Broccoli. Cabbage. Carrots. Eggplant. Green beans. Chard. Kale. Spinach. Onions. Leeks. Peas. Squash. Tomatoes. Peppers. Radishes. Fruits: Apples. Apricots. Avocado. Berries. Bananas. Cherries. Dates. Figs. Grapes. Lemons. Melon. Oranges. Peaches. Plums. Pomegranate. Meats and other protein foods: Beans. Almonds. Sunflower seeds. Pine nuts. Peanuts. Cod. Salmon. Scallops. Shrimp. Tuna. Tilapia. Clams. Oysters. Eggs. Dairy: Low-fat milk. Cheese. Greek yogurt. Beverages: Water. Red wine. Herbal tea. Fats and oils: Extra virgin olive oil. Avocado oil. Grape seed oil. Sweets and desserts: [place] yogurt with honey. Baked apples. Poached pears. Trail mix. Seasoning and other foods: Basil. Cilantro. Coriander. Cumin. Mint. Parsley. Sage. Rosemary. Tarragon. Garlic. Oregano. Thyme. Pepper. Balsalmic vinegar. Tahini. Hummus. Tomato sauce. Olives. Mushrooms. Limit these Grains: Prepackaged pasta or rice dishes. Prepackaged cereal with added sugar. Vegetables: Deep fried potatoes (french fries). Fruits: Fruit canned in syrup. Meats and other protein foods: Beef. Pork. Lamb. Poultry with skin. Hot dogs. Tomasa Blase. Dairy: Ice cream. Sour cream. Whole milk. Beverages: Juice. Sugar-sweetened soft drinks. Beer. Liquor and spirits. Fats and oils: Butter. Canola oil. Vegetable oil. Beef  fat (tallow). Lard. Sweets and desserts: Cookies. Cakes. Pies. Candy. Seasoning and other foods: Mayonnaise. Premade sauces and marinades. The items listed may not be a complete list. Talk with your dietitian about what dietary choices are right for you. Summary The Mediterranean diet includes both food and lifestyle choices. Eat a variety of fresh fruits and vegetables, beans, nuts, seeds, and whole grains. Limit the amount of red meat and sweets that you eat. Talk with your  health care provider about whether it is safe for you to drink red wine in moderation. This means 1 glass a day for nonpregnant women and 2 glasses a day for men. A glass of wine equals 5 oz (150 mL). This information is not intended to replace advice given to you by your health care provider. Make sure you discuss any questions you have with your health care provider. Document Released: 08/07/2016 Document Revised: 09/09/2016 Document Reviewed: 08/07/2016 Elsevier Interactive Patient Education  2017 [employer].

## 2023-11-23 NOTE — Progress Notes (Signed)
Assessment/Plan:     Tristan Le is a very pleasant 82 y.o. year old RH male with a history of hypertension, hyperlipidemia, COPD, GERD, situational depression, seen today for evaluation of memory loss. MoCA today is 29/30 .  Workup is in progress, suspect situational depression/grief may be playing a major component of his memory difficulties. MoCA today is 29/30. Patient is able to participate on his ADLs and to drive.     Memory Impairment of unclear etiology  MRI brain without contrast to assess for underlying structural abnormality and assess vascular load  Neurocognitive testing to further evaluate cognitive concerns and determine other underlying cause of memory changes, including potential contribution from sleep, anxiety, attention, or depression among others  Patient prefers to have PCP to draw his labs (recommend B12, TSH) No indication for antidementia at this time. Recommend good control of cardiovascular risk factors.   Continue to control mood as per PCP and [facility] up in 2 months   Subjective:    The patient is accompanied by his daughter who supplements the history.    How long did patient have memory difficulties?  Since around 22-Jan-2024after the death of his wife the day after Christmas  Patient reports some difficulty remembering new information, recent conversations, names. I skip over when reading. "LTM is not good, it never was". Reads extensively. HE resumed Bible studies recently.  repeats oneself?  Denies  Disoriented when walking into a room?  Patient denies    Leaving objects in unusual places?  Denies . He may misplace some things at times.   Wandering behavior? Denies. "I hardly leave the house"-he says   Any personality changes, or depression, anxiety? He has recently lost his wife, dealing with significant amount of grief. He likes to write in a diary a daily letter to her, that seems to help him get through this period.He attends  counseling Hallucinations or paranoia? Denies Seizures? denies    Any sleep changes? Does not sleep well, has some trouble going back to sleep, takes benadryl as needed. Reports vivid dreams "she visits me in my dreams"-he says , REM behavior or sleepwalking   Sleep apnea? Denies.   Any hygiene concerns?  I have been neglecting myself  over the last 4-5 years" Independent of bathing and dressing? Endorsed  Does the patient need help with medications? Patient is in charge   Who is in charge of the finances? Patient is in charge     Any changes in appetite?   Denies.     Any headaches?  Denies.   Chronic pain? "I have bad arthritis but prednisone helps".  Ambulates with difficulty? Denies   Recent falls or head injuries? Denies.     Vision changes?  Denies any new issues.  Any strokelike symptoms? Denies.   Any tremors? "Due to albuterol".   Any anosmia? Denies.   Any incontinence of urine? Denies.   Any bowel dysfunction? Denies.      Patient lives alone History of heavy alcohol intake? Denies.   History of heavy tobacco use? Denies.   Family history of dementia?  Denies Does patient drive? Yes, denies getting lost, but has been more distracted since January 19, 2023 of this year.    Allergies  Allergen Reactions   Cefuroxime Anaphylaxis and Itching   Penicillins Other (See Comments)    REACTION: Upset stomach,mouth ulcers,rash   Sulfonamide Derivatives Rash    REACTION: Rash with huge blisters    Current Outpatient Medications  Medication Instructions   albuterol (PROVENTIL) 2.5 mg, Nebulization, Every 6 hours PRN   albuterol (VENTOLIN HFA) 108 (90 Base) MCG/ACT inhaler INHALE 1 TO 2 PUFFS EVERY DAY AS NEEDED FOR SHORTNESS OF BREATH   azithromycin (ZITHROMAX) 250 mg, Oral, Daily   BREZTRI AEROSPHERE 160-9-4.8 MCG/ACT AERO INHALE 2 PUFFS IN THE MORNING AND INHALE 2 PUFFS AT BEDTIME   Cyanocobalamin (VITAMIN B 12 PO) 1 tablet, Oral, Daily, Pt unsure of strength   diltiazem (CARDIZEM CD)  240 mg, Oral, Daily   EPINEPHrine (EPI-PEN) 0.3 mg, Intramuscular, As needed   guaiFENesin (MUCINEX) 600 MG 12 hr tablet 1 tablet, Oral, 2 times daily   hydrochlorothiazide (MICROZIDE) 12.5 mg, Oral, Daily   predniSONE (DELTASONE) 5 MG tablet TAKE 1 TABLET EVERY DAY   REPATHA SURECLICK 140 MG/ML SOAJ INJECT 1 PEN (140MG ) UNDER THE SKIN EVERY 14 DAYS   Respiratory Therapy Supplies (FLUTTER) DEVI Use as directed   vitamin C 1,000 mg, Oral, Daily   Vitamin D (Ergocalciferol) (DRISDOL) 5,000 Units, Oral, Every 7 days     VITALS:   Vitals:   11/23/23 0738  BP: 137/67  Pulse: 75  Resp: 18  SpO2: 98%  Weight: 184 lb (83.5 kg)  Height: 5\' 11"  (1.803 m)      PHYSICAL EXAM   HEENT:  Normocephalic, atraumatic.  The superficial temporal arteries are without ropiness or tenderness. Cardiovascular: Regular rate and rhythm. Lungs: Clear to auscultation bilaterally. Neck: There are no carotid bruits noted bilaterally.  NEUROLOGICAL:    11/23/2023    9:00 AM  Montreal Cognitive Assessment   Visuospatial/ Executive (0/5) 5  Naming (0/3) 3  Attention: Read list of digits (0/2) 2  Attention: Read list of letters (0/1) 1  Attention: Serial 7 subtraction starting at 100 (0/3) 3  Language: Repeat phrase (0/2) 2  Language : Fluency (0/1) 0  Abstraction (0/2) 1  Delayed Recall (0/5) 5  Orientation (0/6) 6  Total 28  Adjusted Score (based on education) 29        No data to display           Orientation:  Alert and oriented to person, place and  time . No aphasia or dysarthria. Fund of knowledge is appropriate. Recent memory impaired, remote memory normal.  Attention and concentration are normal.  Able to name objects and repeat phrases. Delayed recall 4/5 Cranial nerves: There is good facial symmetry. Extraocular muscles are intact and visual fields are full to confrontational testing. Speech is fluent and clear. No tongue deviation. Hearing is intact to conversational tone.  Tone:  Tone is good throughout. Sensation: Sensation is intact to light touch.  Vibration is intact at the bilateral big toe.  Coordination: The patient has no difficulty with RAM's or FNF bilaterally. Normal finger to nose  Motor: Strength is 5/5 in the bilateral upper and lower extremities. There is no pronator drift. There are no fasciculations noted. DTR's: Deep tendon reflexes are 2/4 bilaterally. Gait and Station: The patient is able to ambulate without difficulty. Gait is cautious and narrow. Stride length is normal. Unable to walk heal to toe.        Thank you for allowing [place] the opportunity to participate in the care of this nice patient. Please do not hesitate to contact us for any questions or concerns.   Total time spent on today's visit was 46 minutes dedicated to this patient today, preparing to see patient, examining the patient, ordering tests and/or medications and counseling the patient,  documenting clinical information in the EHR or other health record, independently interpreting results and communicating results to the patient/family, discussing treatment and goals, answering patient's questions and coordinating care.  Cc:  Alden Hipp, PA  Marlowe Kays 11/23/2023 9:56 AM

## 2023-11-25 ENCOUNTER — Encounter: Payer: Self-pay | Admitting: Physician Assistant

## 2023-11-26 ENCOUNTER — Other Ambulatory Visit: Payer: Self-pay | Admitting: Emergency Medicine

## 2023-11-26 DIAGNOSIS — J410 Simple chronic bronchitis: Secondary | ICD-10-CM

## 2023-11-30 ENCOUNTER — Ambulatory Visit
Admission: RE | Admit: 2023-11-30 | Discharge: 2023-11-30 | Disposition: A | Discharge status: Home / Self Care | Payer: Medicare HMO | Source: Ambulatory Visit | Attending: Physician Assistant | Admitting: Physician Assistant

## 2023-11-30 DIAGNOSIS — R413 Other amnesia: Secondary | ICD-10-CM

## 2023-12-01 NOTE — Progress Notes (Signed)
The MRI of the brain  showed mild hardening of the small blood vessels in the brain in patients with high cholesterol, blood pressure or sugar control issues, sometimes age as well. It did not show any tumors,or  bleed. Also seen, tiny old strokes, but no new strokes. Make sure you take baby ASA if not on blood thinners. If any signs of stroke in the future, please be seen at the hospital.Thanks

## 2024-01-20 NOTE — Progress Notes (Signed)
Assessment/Plan:   Memory Impairment of unclear etiology  Tristan Le is a delightful  83 y.o. RH male with a history of hypertension, hyperlipidemia, COPD, GERD, situational depression,  seen today in follow up to discuss the MRI of the brain results. 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  There is mild generalized cerebral atrophy noted.  Patient is not currently on antidementia medications.  During his prior visit on 11/23/2023, situational depression-grief was believed to play a major role on his memory difficulties.  MoCA at the time was 29/30.  He is able to participate on his ADLs and to drive without difficulties.  This patient is accompanied in the office by his daughter who supplements the history.  Previous records as well as any outside records available were reviewed prior to todays visit.     Follow up in 6  months. Recommend drawing B12 (MCV 105) Recommend starting donepezil 5 mg daily, side effects discussed.  If tolerated, may consider 10 mg daily. Patient to discuss with his PCP. HE is about to start antidepressant. Agree not to start 2 meds at the same time to avoid undesirable side effects  Patient is scheduled for neuropsych evaluation for diagnostic clarity in the near future Recommend good control of cardiovascular risk factors Continue to control mood as per PCP and behavioral health  Initial visit 11/23/2023 How long did patient have memory difficulties?  Since around 10-Jan-2024after the death of his wife the day after Christmas  Patient reports some difficulty remembering new information, recent conversations, names. I skip over when reading. "LTM is not good, it never was". Reads extensively. HE resumed Bible studies recently.  repeats oneself?   Denies  Disoriented when walking into a room?  Patient denies    Leaving objects in unusual places?  Denies . He may misplace some things at times.   Wandering behavior? Denies. "I hardly leave the house"-he says   Any personality changes, or depression, anxiety? He has recently lost his wife, dealing with significant amount of grief. He likes to write in a diary a daily letter to her, that seems to help him get through this period. He attends counseling with Adron Bene, LCSWA, last visit 12/02/2023. Hallucinations or paranoia? Denies Seizures? denies    Any sleep changes? Does not sleep well, has some trouble going back to sleep, takes benadryl as needed. Reports vivid dreams "she visits me in my dreams"-he says , REM behavior or sleepwalking   Sleep apnea? Denies.   Any hygiene concerns?  I have been neglecting myself  over the last 4-5 years" Independent of bathing and dressing? Endorsed  Does the patient need help with medications? Patient is in charge   Who is in charge of the finances? Patient is in charge     Any changes in appetite?   Denies.     Any headaches?  Denies.   Chronic pain? "I have bad arthritis but prednisone helps".  Ambulates with difficulty? Denies   Recent falls or head injuries? Denies.     Vision changes?  Denies any new issues.  Any strokelike symptoms? Denies.   Any tremors? "Due to albuterol".   Any anosmia? Denies.   Any incontinence of urine? Denies.   Any bowel dysfunction? Denies.      Patient lives alone History of heavy alcohol intake?  Denies.   History of heavy tobacco use? Denies.   Family history of dementia?  Denies Does patient drive? Yes, denies getting lost, but has been more distracted since Jan of this year.   Pertinent labs December 2024 TSH and free T4 at 1.49 and 1.3 respectively, A1c was 6, lipid profile normal, CMP unremarkable, of note, he was noted on his CBC on 12/02/2023 to have an MCV of 105  CURRENT MEDICATIONS:  Outpatient  Encounter Medications as of 01/25/2024  Medication Sig   albuterol (PROVENTIL) (2.5 MG/3ML) 0.083% nebulizer solution Take 3 mLs (2.5 mg total) by nebulization every 6 (six) hours as needed for wheezing or shortness of breath.   albuterol (VENTOLIN HFA) 108 (90 Base) MCG/ACT inhaler INHALE 1 TO 2 PUFFS EVERY DAY AS NEEDED FOR SHORTNESS OF BREATH   Ascorbic Acid (VITAMIN C) 1000 MG tablet Take 1,000 mg by mouth daily.   azithromycin (ZITHROMAX) 250 MG tablet TAKE 1 TABLET EVERY DAY   BREZTRI AEROSPHERE 160-9-4.8 MCG/ACT AERO INHALE 2 PUFFS IN THE MORNING AND INHALE 2 PUFFS AT BEDTIME   Cyanocobalamin (VITAMIN B 12 PO) Take 1 tablet by mouth daily. Pt unsure of strength   diltiazem (CARDIZEM CD) 240 MG 24 hr capsule TAKE 1 CAPSULE EVERY DAY   EPINEPHrine 0.3 mg/0.3 mL IJ SOAJ injection Inject 0.3 mg into the muscle as needed for anaphylaxis.   guaiFENesin (MUCINEX) 600 MG 12 hr tablet Take 1 tablet by mouth 2 (two) times daily.    hydrochlorothiazide (MICROZIDE) 12.5 MG capsule Take 1 capsule (12.5 mg total) by mouth daily.   predniSONE (DELTASONE) 5 MG tablet TAKE 1 TABLET EVERY DAY   REPATHA SURECLICK 140 MG/ML SOAJ INJECT 1 PEN (140MG ) UNDER THE SKIN EVERY 14 DAYS   Respiratory Therapy Supplies (FLUTTER) DEVI Use as directed   Vitamin D, Ergocalciferol, (DRISDOL) 1.25 MG (50000 UNIT) CAPS capsule Take 5,000 Units by mouth every 7 (seven) days.   No facility-administered encounter medications on file as of 01/25/2024.        No data to display            11/23/2023    9:00 AM  Montreal Cognitive Assessment   Visuospatial/ Executive (0/5) 5  Naming (0/3) 3  Attention: Read list of digits (0/2) 2  Attention: Read list of letters (0/1) 1  Attention: Serial 7 subtraction starting at 100 (0/3) 3  Language: Repeat phrase (0/2) 2  Language : Fluency (0/1) 0  Abstraction (0/2) 1  Delayed Recall (0/5) 5  Orientation (0/6) 6  Total 28  Adjusted Score (based on education) 29   Thank you  for allowing [place] the opportunity to participate in the care of this nice patient. Please do not hesitate to contact us for any questions or concerns.   Total time spent on today's visit was 46 minutes dedicated to this patient today, preparing to see patient, examining the patient, ordering tests and/or medications and counseling the patient, documenting clinical information in the EHR or other health record, independently interpreting results and communicating results to the patient/family, discussing treatment and goals, answering patient's questions and coordinating care.  Cc:  Alden Hipp, [place]  Marlowe Kays 01/20/2024 11:55 AM

## 2024-01-25 ENCOUNTER — Ambulatory Visit (INDEPENDENT_AMBULATORY_CARE_PROVIDER_SITE_OTHER): Payer: Medicare HMO | Admitting: Physician Assistant

## 2024-01-25 ENCOUNTER — Encounter: Payer: Self-pay | Admitting: Physician Assistant

## 2024-01-25 VITALS — BP 149/75 | HR 75 | Resp 20 | Ht 72.0 in | Wt 182.0 lb

## 2024-01-25 DIAGNOSIS — R413 Other amnesia: Secondary | ICD-10-CM

## 2024-01-25 NOTE — Patient Instructions (Addendum)
It was a pleasure to see you today at our office.   Recommendations:  Neurocognitive evaluation at our office   Follow up in 11:30 Aug 4   For psychiatric meds, mood meds: Please have your primary care physician manage these medications.  If you have any severe symptoms of a stroke, or other severe issues such as confusion,severe chills or fever, etc call 911 or go to the ER as you may need to be evaluated further     For assessment of decision of mental capacity and competency:  Call Dr. Erick Blinks, geriatric psychiatrist at [phone]  Counseling regarding caregiver distress, including caregiver depression, anxiety and issues regarding community resources, adult day care programs, adult living facilities, or memory care questions:  please contact your  Primary Doctor's Social Worker   Whom to call: Memory  decline, memory medications: Call our office [phone]    [email or web address]   RECOMMENDATIONS FOR ALL PATIENTS WITH MEMORY PROBLEMS: 1. Continue to exercise (Recommend 30 minutes of walking everyday, or 3 hours every week) 2. Increase social interactions - continue going to [place] and enjoy social gatherings with friends and family 3. Eat healthy, avoid fried foods and eat more fruits and vegetables 4. Maintain adequate blood pressure, blood sugar, and blood cholesterol level. Reducing the risk of stroke and cardiovascular disease also helps promoting better memory. 5. Avoid stressful situations. Live a simple life and avoid aggravations. Organize your time and prepare for the next day in anticipation. 6. Sleep well, avoid any interruptions of sleep and avoid any distractions in the bedroom that may interfere with adequate sleep quality 7. Avoid sugar, avoid sweets as there is a strong link between excessive sugar intake, diabetes, and cognitive impairment We discussed the Mediterranean diet, which has been shown to  help patients reduce the risk of progressive memory disorders and reduces cardiovascular risk. This includes eating fish, eat fruits and green leafy vegetables, nuts like almonds and hazelnuts, walnuts, and also use olive oil. Avoid fast foods and fried foods as much as possible. Avoid sweets and sugar as sugar use has been linked to worsening of memory function.  There is always a concern of gradual progression of memory problems. If this is the case, then we may need to adjust level of care according to patient needs. Support, both to the patient and caregiver, should then be put into place.      You have been referred for a neuropsychological evaluation (i.e., evaluation of memory and thinking abilities). Please bring someone with you to this appointment if possible, as it is helpful for the doctor to hear from both you and another adult who knows you well. Please bring eyeglasses and hearing aids if you wear them.    The evaluation will take approximately 3 hours and has two parts:   The first part is a clinical interview with the neuropsychologist (Dr. Milbert Coulter or Dr. Roseanne Reno). During the interview, the neuropsychologist will speak with you and the individual you brought to the appointment.    The second part of the evaluation is testing with the doctor's technician Annabelle Harman or Selena Batten). During the testing, the technician will ask you to remember different types of material, solve problems, and answer some questionnaires. Your family member will not be present for this portion of the evaluation.   Please note: We must reserve several hours of the neuropsychologist's time and the psychometrician's time for your evaluation appointment. As such, there is a No-Show fee of $100. If you are unable  to attend any of your appointments, please contact our office as soon as possible to reschedule.      DRIVING: Regarding driving, in patients with progressive memory problems, driving will be impaired. We advise to  have someone else do the driving if trouble finding directions or if minor accidents are reported. Independent driving assessment is available to determine safety of driving.   If you are interested in the driving assessment, you can contact the following:  [facility] in [place] [phone]  [facility] [phone]  [facility] [phone]  [facility] [phone] or [phone]   FALL PRECAUTIONS: Be cautious when walking. Scan the area for obstacles that may increase the risk of trips and falls. When getting up in the mornings, sit up at the edge of the bed for a few minutes before getting out of bed. Consider elevating the bed at the head end to avoid drop of blood pressure when getting up. Walk always in a well-lit room (use night lights in the walls). Avoid area rugs or power cords from appliances in the middle of the walkways. Use a walker or a cane if necessary and consider physical therapy for balance exercise. Get your eyesight checked regularly.  FINANCIAL OVERSIGHT: Supervision, especially oversight when making financial decisions or transactions is also recommended.  HOME SAFETY: Consider the safety of the kitchen when operating appliances like stoves, microwave oven, and blender. Consider having supervision and share cooking responsibilities until no longer able to participate in those. Accidents with firearms and other hazards in the house should be identified and addressed as well.   ABILITY TO BE LEFT ALONE: If patient is unable to contact 911 operator, consider using Life[employer], or when the need is there, arrange for someone to stay with patients. Smoking is a fire hazard, consider supervision or cessation. Risk of wandering should be assessed by caregiver and if detected at any point, supervision and safe proof recommendations should be instituted.  MEDICATION SUPERVISION: Inability to self-administer medication needs to  be constantly addressed. Implement a mechanism to ensure safe administration of the medications.      Mediterranean Diet A Mediterranean diet refers to food and lifestyle choices that are based on the traditions of countries located on the [employer]. This way of eating has been shown to help prevent certain conditions and improve outcomes for people who have chronic diseases, like kidney disease and heart disease. What are tips for following this plan? Lifestyle  Cook and eat meals together with your family, when possible. Drink enough fluid to keep your urine clear or pale yellow. Be physically active every day. This includes: Aerobic exercise like running or swimming. Leisure activities like gardening, walking, or housework. Get 7-8 hours of sleep each night. If recommended by your health care provider, drink red wine in moderation. This means 1 glass a day for nonpregnant women and 2 glasses a day for men. A glass of wine equals 5 oz (150 mL). Reading food labels  Check the serving size of packaged foods. For foods such as rice and pasta, the serving size refers to the amount of cooked product, not dry. Check the total fat in packaged foods. Avoid foods that have saturated fat or trans fats. Check the ingredients list for added sugars, such as corn syrup. Shopping  At the grocery store, buy most of your food from the areas near the walls of the store. This includes: Fresh fruits and vegetables (produce). Grains, beans, nuts, and seeds. Some  of these may be available in unpackaged forms or large amounts (in bulk). Fresh seafood. Poultry and eggs. Low-fat dairy products. Buy whole ingredients instead of prepackaged foods. Buy fresh fruits and vegetables in-season from local farmers markets. Buy frozen fruits and vegetables in resealable bags. If you do not have access to quality fresh seafood, buy precooked frozen shrimp or canned fish, such as tuna, salmon, or sardines. Buy  small amounts of raw or cooked vegetables, salads, or olives from the deli or salad bar at your store. Stock your pantry so you always have certain foods on hand, such as olive oil, canned tuna, canned tomatoes, rice, pasta, and beans. Cooking  Cook foods with extra-virgin olive oil instead of using butter or other vegetable oils. Have meat as a side dish, and have vegetables or grains as your main dish. This means having meat in small portions or adding small amounts of meat to foods like pasta or stew. Use beans or vegetables instead of meat in common dishes like chili or lasagna. Experiment with different cooking methods. Try roasting or broiling vegetables instead of steaming or sauteing them. Add frozen vegetables to soups, stews, pasta, or rice. Add nuts or seeds for added healthy fat at each meal. You can add these to yogurt, salads, or vegetable dishes. Marinate fish or vegetables using olive oil, lemon juice, garlic, and fresh herbs. Meal planning  Plan to eat 1 vegetarian meal one day each week. Try to work up to 2 vegetarian meals, if possible. Eat seafood 2 or more times a week. Have healthy snacks readily available, such as: Vegetable sticks with hummus. Greek yogurt. Fruit and nut trail mix. Eat balanced meals throughout the week. This includes: Fruit: 2-3 servings a day Vegetables: 4-5 servings a day Low-fat dairy: 2 servings a day Fish, poultry, or lean meat: 1 serving a day Beans and legumes: 2 or more servings a week Nuts and seeds: 1-2 servings a day Whole grains: 6-8 servings a day Extra-virgin olive oil: 3-4 servings a day Limit red meat and sweets to only a few servings a month What are my food choices? Mediterranean diet Recommended Grains: Whole-grain pasta. Brown rice. Bulgar wheat. Polenta. Couscous. Whole-wheat bread. Orpah Cobb. Vegetables: Artichokes. Beets. Broccoli. Cabbage. Carrots. Eggplant. Green beans. Chard. Kale. Spinach. Onions. Leeks. Peas.  Squash. Tomatoes. Peppers. Radishes. Fruits: Apples. Apricots. Avocado. Berries. Bananas. Cherries. Dates. Figs. Grapes. Lemons. Melon. Oranges. Peaches. Plums. Pomegranate. Meats and other protein foods: Beans. Almonds. Sunflower seeds. Pine nuts. Peanuts. Cod. Salmon. Scallops. Shrimp. Tuna. Tilapia. Clams. Oysters. Eggs. Dairy: Low-fat milk. Cheese. Greek yogurt. Beverages: Water. Red wine. Herbal tea. Fats and oils: Extra virgin olive oil. Avocado oil. Grape seed oil. Sweets and desserts: [place] yogurt with honey. Baked apples. Poached pears. Trail mix. Seasoning and other foods: Basil. Cilantro. Coriander. Cumin. Mint. Parsley. Sage. Rosemary. Tarragon. Garlic. Oregano. Thyme. Pepper. Balsalmic vinegar. Tahini. Hummus. Tomato sauce. Olives. Mushrooms. Limit these Grains: Prepackaged pasta or rice dishes. Prepackaged cereal with added sugar. Vegetables: Deep fried potatoes (french fries). Fruits: Fruit canned in syrup. Meats and other protein foods: Beef. Pork. Lamb. Poultry with skin. Hot dogs. Tomasa Blase. Dairy: Ice cream. Sour cream. Whole milk. Beverages: Juice. Sugar-sweetened soft drinks. Beer. Liquor and spirits. Fats and oils: Butter. Canola oil. Vegetable oil. Beef fat (tallow). Lard. Sweets and desserts: Cookies. Cakes. Pies. Candy. Seasoning and other foods: Mayonnaise. Premade sauces and marinades. The items listed may not be a complete list. Talk with your dietitian about what dietary choices are right  for you. Summary The Mediterranean diet includes both food and lifestyle choices. Eat a variety of fresh fruits and vegetables, beans, nuts, seeds, and whole grains. Limit the amount of red meat and sweets that you eat. Talk with your health care provider about whether it is safe for you to drink red wine in moderation. This means 1 glass a day for nonpregnant women and 2 glasses a day for men. A glass of wine equals 5 oz (150 mL). This information is not intended to replace advice  given to you by your health care provider. Make sure you discuss any questions you have with your health care provider. Document Released: 08/07/2016 Document Revised: 09/09/2016 Document Reviewed: 08/07/2016 Elsevier Interactive Patient Education  2017 [employer].

## 2024-02-28 ENCOUNTER — Other Ambulatory Visit: Payer: Self-pay | Admitting: Emergency Medicine

## 2024-02-28 DIAGNOSIS — J410 Simple chronic bronchitis: Secondary | ICD-10-CM

## 2024-03-05 ENCOUNTER — Other Ambulatory Visit: Payer: Self-pay | Admitting: Nurse Practitioner

## 2024-03-05 DIAGNOSIS — J449 Chronic obstructive pulmonary disease, unspecified: Secondary | ICD-10-CM

## 2024-03-14 ENCOUNTER — Ambulatory Visit: Payer: Self-pay

## 2024-03-14 ENCOUNTER — Ambulatory Visit (INDEPENDENT_AMBULATORY_CARE_PROVIDER_SITE_OTHER): Payer: Medicare HMO | Admitting: Psychology

## 2024-03-14 ENCOUNTER — Encounter: Payer: Self-pay | Admitting: Psychology

## 2024-03-14 DIAGNOSIS — R419 Unspecified symptoms and signs involving cognitive functions and awareness: Secondary | ICD-10-CM | POA: Diagnosis not present

## 2024-03-14 DIAGNOSIS — R4189 Other symptoms and signs involving cognitive functions and awareness: Secondary | ICD-10-CM

## 2024-03-14 DIAGNOSIS — F430 Acute stress reaction: Secondary | ICD-10-CM | POA: Diagnosis not present

## 2024-03-14 NOTE — Progress Notes (Signed)
   Psychometrician Note   Cognitive testing was administered to Tristan Le by Shan Levans, B.S. (psychometrist) under the supervision of Dr. Annice Pih, Psy.D., licensed psychologist on 03/14/2024. Tristan Le did not appear overtly distressed by the testing session per behavioral observation or responses across self-report questionnaires. Rest breaks were offered.    The battery of tests administered was selected by Dr. Annice Pih, Psy.D. with consideration to Tristan Le current level of functioning, the nature of his symptoms, emotional and behavioral responses during interview, level of literacy, observed level of motivation/effort, and the nature of the referral question. This battery was communicated to the psychometrist. Communication between Dr. Annice Pih, Psy.D. and the psychometrist was ongoing throughout the evaluation and Dr. Annice Pih, Psy.D. was immediately accessible at all times. Dr. Annice Pih, Psy.D. provided supervision to the psychometrist on the date of this service to the extent necessary to assure the quality of all services provided.    Tristan Le will return within approximately 1-2 weeks for an interactive feedback session with Dr. Robbie Lis at which time his test performances, clinical impressions, and treatment recommendations will be reviewed in detail. Tristan Le understands he can contact our office should he require our assistance before this time.  A total of 110 minutes of billable time were spent face-to-face with Tristan Le by the psychometrist. This includes both test administration and scoring time. Billing for these services is reflected in the clinical report generated by Dr. Annice Pih, Psy.D.  This note reflects time spent with the psychometrician and does not include test scores or any clinical interpretations made by Dr. Robbie Lis. The full report will follow in a separate note.
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
## 2024-03-15 ENCOUNTER — Other Ambulatory Visit: Payer: Self-pay | Admitting: Cardiovascular Disease

## 2024-03-15 DIAGNOSIS — R931 Abnormal findings on diagnostic imaging of heart and coronary circulation: Secondary | ICD-10-CM

## 2024-03-15 DIAGNOSIS — E782 Mixed hyperlipidemia: Secondary | ICD-10-CM

## 2024-03-21 ENCOUNTER — Ambulatory Visit (INDEPENDENT_AMBULATORY_CARE_PROVIDER_SITE_OTHER): Payer: Medicare HMO | Admitting: Psychology

## 2024-03-21 DIAGNOSIS — R419 Unspecified symptoms and signs involving cognitive functions and awareness: Secondary | ICD-10-CM

## 2024-03-21 DIAGNOSIS — F430 Acute stress reaction: Secondary | ICD-10-CM | POA: Diagnosis not present

## 2024-03-21 NOTE — Progress Notes (Signed)
   NEUROPSYCHOLOGY FEEDBACK SESSION [place]. [facility]  [facility] Department of Neurology  Date of Feedback Session: 03/21/2024  REASON FOR REFERRAL   Argyle Gustafson is an 83 year old, right-handed, White male with 9 years of formal education. He was referred for neuropsychological evaluation by Marlowe Kays, PA-C, to assess current neurocognitive functioning, document potential cognitive deficits, and assist with treatment planning.   FEEDBACK   Patient completed a comprehensive neuropsychological evaluation on 03/14/2024. Please refer to that encounter for the full report and recommendations. Briefly, results indicated normal cognitive performance in the context of continued functional independence. The patient does not show evidence of a neurocognitive disorder at this time. Subjective cognitive concerns are likely related to normal aging, grief and sadness, and decreased social stimulation. To the extent that these factors can be improved, he may find that his cognition improves. Results further serve as a baseline for future comparison, should it ever become necessary to re-evaluate the patient.  Today, the patient was accompanied by his daughter. They were provided verbal feedback regarding the findings and impression during this visit, and their questions were answered. A copy of the report was provided at the conclusion of the visit.  DISPOSITION   Patient will follow up with the referring provider, Ms. Wertman. No follow-up neuropsychological testing was scheduled at this time. Please feel free to refer the patient for repeated evaluation if he shows a significant change in neurocognitive status.  SERVICE   This feedback session was conducted by Annice Pih, Psy.D. One unit of 16109 was billed for Dr. Robbie Lis' time spent in preparing, conducting, and documenting the current feedback session.  This report was generated using voice recognition software. While this document  has been carefully reviewed, transcription errors may be present. I apologize in advance for any inconvenience. Please contact me if further clarification is needed.

## 2024-04-06 ENCOUNTER — Encounter: Payer: Medicare HMO | Admitting: Psychology

## 2024-06-08 ENCOUNTER — Other Ambulatory Visit: Payer: Self-pay | Admitting: Cardiovascular Disease

## 2024-06-08 DIAGNOSIS — E782 Mixed hyperlipidemia: Secondary | ICD-10-CM

## 2024-06-08 DIAGNOSIS — R931 Abnormal findings on diagnostic imaging of heart and coronary circulation: Secondary | ICD-10-CM

## 2024-06-09 ENCOUNTER — Encounter: Payer: Self-pay | Admitting: Cardiovascular Disease

## 2024-06-16 ENCOUNTER — Ambulatory Visit (INDEPENDENT_AMBULATORY_CARE_PROVIDER_SITE_OTHER): Admitting: Physician Assistant

## 2024-06-16 ENCOUNTER — Encounter: Payer: Self-pay | Admitting: Physician Assistant

## 2024-06-16 VITALS — BP 121/69 | HR 82 | Resp 20 | Ht 71.0 in

## 2024-06-16 DIAGNOSIS — R419 Unspecified symptoms and signs involving cognitive functions and awareness: Secondary | ICD-10-CM | POA: Diagnosis not present

## 2024-06-16 NOTE — Progress Notes (Signed)
 Assessment/Plan:    Cognitive concerns  Tristan Le is a very pleasant 83 y.o. RH male with a history of  presenting today in follow-up for evaluation of subjective memory complaints.  He had a normal neurocognitive exam, not showing evidence of neurocognitive disorder at this time.  The subjective cognitive concerns are likely related to normal aging, grief and sadness, and decreased social stimulation.  He will need to participate in his ADLs and to drive.     Recommendations:   No follow-up is indicated Continue B12 supplementation No indication for antiemetic medication given the above diagnosis. Recommend psychotherapy for grief counseling, depression. Recommend good control of cardiovascular risk factors Continue to control mood as per PCP    Subjective:   This patient is here alone. Previous records as well as any outside records available were reviewed prior to todays visit.   Patient was last seen on 01/25/24  .    Any changes in memory since last visit? Not getting worse. He is somewhat isolated, except visiting his family.  He continues to write daily letters to his wife . Does some gardening but dislikes going outside.    repeats oneself? Denies  Disoriented when walking into a room?  Patient denies   Misplacing objects?  Patient denies   Wandering behavior?   Denies. Any personality changes since last visit? Denies.   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 Hallucinations or paranoia?  Denies.   Seizures?   Denies.    Any sleep changes? Sleeps well, tried trazodone but made me very groggy and slow, stopped for the last 2 weeks.  Denies vivid dreams, REM behavior or sleepwalking   Sleep apnea?   Denies.    Any hygiene concerns?   Denies.   Independent of bathing and dressing?  Endorsed  Does the patient needs help with medications? Patient is in charge   Who is in  charge of the finances?  Patient is in charge     Any changes in appetite?  Denies.  He increased his calorie intake and protein.    Patient have trouble swallowing?  Denies.   Does the patient cook?  Any kitchen accidents such as leaving the stove on?   Denies.   Any headaches?    Denies.   Vision changes? Denies. Chronic pain?  Denies.   Ambulates with difficulty? Has arthritis he has mobility issues.  Recent falls or head injuries? Denies.      Unilateral weakness, numbness or tingling?  Denies.   Any tremors?  Denies.   Any anosmia?    Denies.   Any incontinence of urine?  Denies.   Any bowel dysfunction?  Denies.      Patient lives alone Does the patient drive? Yes, denies any accidents, denies getting lost.  Neuropsych evaluation, Dr. Donavon Fudge 03/14/2024 briefly, indicated normal cognitive performance in the context of continued functional independence. The patient does not show evidence of a neurocognitive disorder at this time. Subjective cognitive concerns are likely related to normal aging, grief and sadness, and decreased social stimulation. To the extent that these factors can be improved, he may find that his cognition improves.       Initial visit 11/23/2023  How long did patient have memory difficulties?  Since around 2024/01/28after the death of his wife the day after Christmas  Patient reports some difficulty  remembering new information, recent conversations, names. I skip over when reading. LTM is not good, it never was. Reads extensively. He resumed Bible studies recently.  repeats oneself?  Denies  Disoriented when walking into a room?  Patient denies    Leaving objects in unusual places?  Denies . He may misplace some things at times.   Wandering behavior? Denies. I hardly leave the house-he says   Any personality changes, or depression, anxiety? He has recently lost his wife, dealing with significant amount of grief. He likes to write in a diary a daily letter to  her, that seems to help him get through this period. He attends counseling with Susi Eric, LCSWA, last visit 12/02/2023. Hallucinations or paranoia? Denies Seizures? denies    Any sleep changes? Does not sleep well, has some trouble going back to sleep, takes benadryl as needed. Reports vivid dreams she visits me in my dreams-he says , REM behavior or sleepwalking   Sleep apnea? Denies.   Any hygiene concerns?  I have been neglecting myself  over the last 4-5 years Independent of bathing and dressing? Endorsed  Does the patient need help with medications? Patient is in charge   Who is in charge of the finances? Patient is in charge     Any changes in appetite?   Denies.     Any headaches?  Denies.   Chronic pain? I have bad arthritis but prednisone  helps.  Ambulates with difficulty? Denies   Recent falls or head injuries? Denies.     Vision changes?  Denies any new issues.  Any strokelike symptoms? Denies.   Any tremors? Due to albuterol .   Any anosmia? Denies.   Any incontinence of urine? Denies.   Any bowel dysfunction? Denies.      Patient lives alone History of heavy alcohol intake? Denies.   History of heavy tobacco use? Denies.   Family history of dementia?  Denies Does patient drive? Yes, denies getting lost, but has been more distracted since Jan of this year.    Past Medical History:  Diagnosis Date   Arthritis    Chest pain    COPD (chronic obstructive pulmonary disease) (HCC) 05/18/2019   Cough 05/18/2019   06/13/2019-AFB-negative Fungal culture-showing yeast Respiratory sputum culture-negative    GERD (gastroesophageal reflux disease)    Hyperlipidemia    Hypertension      History reviewed. No pertinent surgical history.   PREVIOUS MEDICATIONS:   CURRENT MEDICATIONS:  Outpatient Encounter Medications as of 06/16/2024  Medication Sig   albuterol  (PROVENTIL ) (2.5 MG/3ML) 0.083% nebulizer solution Take 3 mLs (2.5 mg total) by nebulization every 6 (six)  hours as needed for wheezing or shortness of breath.   albuterol  (VENTOLIN  HFA) 108 (90 Base) MCG/ACT inhaler INHALE 1 TO 2 PUFFS EVERY DAY AS NEEDED FOR SHORTNESS OF BREATH   Ascorbic Acid (VITAMIN C) 1000 MG tablet Take 1,000 mg by mouth daily.   azithromycin  (ZITHROMAX ) 250 MG tablet TAKE 1 TABLET EVERY DAY   BREZTRI  AEROSPHERE 160-9-4.8 MCG/ACT AERO INHALE 2 PUFFS IN THE MORNING AND INHALE 2 PUFFS AT BEDTIME   Cyanocobalamin (VITAMIN B 12 PO) Take 1 tablet by mouth daily. Pt unsure of strength   diltiazem  (CARDIZEM  CD) 240 MG 24 hr capsule TAKE 1 CAPSULE EVERY DAY   EPINEPHrine  0.3 mg/0.3 mL IJ SOAJ injection Inject 0.3 mg into the muscle as needed for anaphylaxis.   Evolocumab  (REPATHA  SURECLICK) 140 MG/ML SOAJ INJECT 1 PEN (140MG ) UNDER THE SKIN EVERY 14 DAYS  guaiFENesin (MUCINEX) 600 MG 12 hr tablet Take 1 tablet by mouth 2 (two) times daily.    hydrochlorothiazide  (MICROZIDE ) 12.5 MG capsule Take 1 capsule (12.5 mg total) by mouth daily.   predniSONE  (DELTASONE ) 5 MG tablet TAKE 1 TABLET EVERY DAY   Respiratory Therapy Supplies (FLUTTER) DEVI Use as directed   traZODone (DESYREL) 50 MG tablet Take by mouth.   Vitamin D, Ergocalciferol, (DRISDOL) 1.25 MG (50000 UNIT) CAPS capsule Take 5,000 Units by mouth every 7 (seven) days.   No facility-administered encounter medications on file as of 06/16/2024.     Objective:     PHYSICAL EXAMINATION:    VITALS:   Vitals:   06/16/24 1111  BP: 121/69  Pulse: 82  Resp: 20  SpO2: 96%  Height: 5' 11 (1.803 m)    GEN:  The patient appears stated age and is in NAD. HEENT:  Normocephalic, atraumatic.   Neurological examination:  General: NAD, well-groomed, appears stated age. Orientation: The patient is alert. Oriented to person, place and to date. Cranial nerves: There is good facial symmetry.The speech is fluent and clear. No aphasia or dysarthria. Fund of knowledge is appropriate. Recent memory and remote memory is normal.   Attention and concentration are normal.  Able to name objects and repeat phrases.  Hearing is intact to conversational tone   Sensation: Sensation is intact to light touch throughout Motor: Strength is at least antigravity x4. DTR's 2/4 in UE/LE      11/23/2023    9:00 AM  Montreal Cognitive Assessment   Visuospatial/ Executive (0/5) 5  Naming (0/3) 3  Attention: Read list of digits (0/2) 2  Attention: Read list of letters (0/1) 1  Attention: Serial 7 subtraction starting at 100 (0/3) 3  Language: Repeat phrase (0/2) 2  Language : Fluency (0/1) 0  Abstraction (0/2) 1  Delayed Recall (0/5) 5  Orientation (0/6) 6  Total 28  Adjusted Score (based on education) 29        No data to display             Movement examination: Tone: There is normal tone in the UE/LE Abnormal movements:  no tremor.  No myoclonus.  No asterixis.   Coordination:  There is no decremation with RAM's. Normal finger to nose  Gait and Station: The patient has no difficulty arising out of a deep-seated chair without the use of the hands. The patient's stride length is good.  Gait is cautious and narrow.   Thank you for allowing us  the opportunity to participate in the care of this nice patient. Please do not hesitate to contact us  for any questions or concerns.   Total time spent on today's visit was 31 minutes dedicated to this patient today, preparing to see patient, examining the patient, ordering tests and/or medications and counseling the patient, documenting clinical information in the EHR or other health record, independently interpreting results and communicating results to the patient/family, discussing treatment and goals, answering patient's questions and coordinating care.  Cc:  Tennie Feil, [place]  Tex Filbert 06/16/2024 12:24 PM

## 2024-06-16 NOTE — Patient Instructions (Signed)
 No follow up  Thank you for letting us  take care of you

## 2024-07-08 ENCOUNTER — Institutional Professional Consult (permissible substitution): Payer: Medicare HMO | Admitting: Psychology

## 2024-07-08 ENCOUNTER — Ambulatory Visit: Payer: Self-pay

## 2024-07-15 ENCOUNTER — Encounter: Payer: Medicare HMO | Admitting: Psychology

## 2024-08-01 ENCOUNTER — Ambulatory Visit: Payer: Medicare HMO | Admitting: Physician Assistant

## 2024-08-31 ENCOUNTER — Other Ambulatory Visit: Payer: Self-pay

## 2024-09-01 MED ORDER — DILTIAZEM HCL ER COATED BEADS 240 MG PO CP24: 240.0000 mg | ORAL_CAPSULE | Freq: Every day | ORAL | 0 refills | Status: AC

## 2024-09-12 NOTE — Progress Notes (Signed)
 Cardiology Office Note   Date:  09/13/2024  ID:  DECKARD STUBER, DOB 04-Sep-1941, MRN [id number] PCP: Stoney Ryder, PA  [facility] Providers Cardiologist:  Emeline FORBES Calender, MD     History of Present Illness Tristan Le is a 83 y.o. male with a past medical history of elevated calcium score, HFpEF, COPD, ILD, tobacco use, memory impairment, depression, hyperlipidemia who initially presented for evaluation of shortness of breath in the setting of normal PFTs and presents today for 1 year follow-up.  He has not had any exertional shortness of breath since quitting smoking several years ago.  He has not had any chest pain.  He states that he mainly has canned and frozen foods and add salt to [place] fries.  He has not been cooking for himself since his wife passed away several years ago.  He has not been walking much out of laziness.  No other complaints.   ROS:  Review of Systems  All other systems reviewed and are negative.   Physical Exam  Physical Exam Vitals and nursing note reviewed.  Constitutional:      Appearance: Normal appearance.  HENT:     Head: Normocephalic and atraumatic.  Eyes:     Conjunctiva/sclera: Conjunctivae normal.  Neck:     Vascular: No carotid bruit.  Cardiovascular:     Rate and Rhythm: Normal rate and regular rhythm.     Comments: Regular rhythm with occasional extrasystoles Pulmonary:     Effort: Pulmonary effort is normal.     Breath sounds: Normal breath sounds.  Musculoskeletal:        General: No swelling or tenderness.  Skin:    Coloration: Skin is not jaundiced or pale.  Neurological:     Mental Status: He is alert.     VS:  BP 124/68 (BP Location: Right Arm, Cuff Size: Normal)   Pulse 79   Ht 5' 11 (1.803 m)   Wt 195 lb (88.5 kg)   BMI 27.20 kg/m         Wt Readings from Last 3 Encounters:  09/13/24 195 lb (88.5 kg)  01/25/24 182 lb (82.6 kg)  11/23/23 184 lb (83.5 kg)     EKG  Interpretation Date/Time:  Tuesday September 13 2024 10:46:46 EDT Ventricular Rate:  79 PR Interval:  202 QRS Duration:  86 QT Interval:  374 QTC Calculation: 428 R Axis:   -1  Text Interpretation: Normal sinus rhythm Normal ECG When compared with ECG of 29-Dec-2021 11:57, Although rate has increased Confirmed by Calender Emeline [phone]) on 09/13/2024 11:06:58 AM    Studies Reviewed   Coronary calcium score 10/27/2019: 1612-84th percentile for age and sex matched control  Echo 10/11/2019 -EF 60 to 65% -Grade 1 diastolic dysfunction -Mildly dilated ascending order 39 mm  Pharmacologic SPECT 10/11/2019 Normal perfusion. LVEF 65% with normal wall motion. This is a low risk study. No changes compared to prior study in 2011.   Risk Assessment/Calculations              ASSESSMENT  Hypertension BP elevated initially and improved after resting on repeat.  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 Limit salt. moderate walking, 3-5 times/week for 30-50 minutes each session. Aim for at least 150 minutes.week. Goal should be pace of 3 miles/hour, or walking 1.5 miles in 30 minutes avoidance of tobacco products. Avoid excess alcohol.  Follow up: 1 year          Signed, Emeline FORBES Calender, MD

## 2024-09-13 ENCOUNTER — Ambulatory Visit: Attending: Internal Medicine | Admitting: Internal Medicine

## 2024-09-13 ENCOUNTER — Encounter: Payer: Self-pay | Admitting: Internal Medicine

## 2024-09-13 VITALS — BP 124/68 | HR 79 | Ht 71.0 in | Wt 195.0 lb

## 2024-09-13 DIAGNOSIS — R931 Abnormal findings on diagnostic imaging of heart and coronary circulation: Secondary | ICD-10-CM | POA: Diagnosis not present

## 2024-09-13 DIAGNOSIS — J449 Chronic obstructive pulmonary disease, unspecified: Secondary | ICD-10-CM

## 2024-09-13 DIAGNOSIS — R0609 Other forms of dyspnea: Secondary | ICD-10-CM | POA: Diagnosis not present

## 2024-09-13 DIAGNOSIS — I5032 Chronic diastolic (congestive) heart failure: Secondary | ICD-10-CM | POA: Diagnosis not present

## 2024-09-13 DIAGNOSIS — E782 Mixed hyperlipidemia: Secondary | ICD-10-CM | POA: Diagnosis not present

## 2024-09-13 NOTE — Patient Instructions (Signed)
 Medication Instructions:  No medication changes were made at this visit. Continue current regimen.   Follow-Up: At [facility], you and your health needs are our priority.  As part of our continuing mission to provide you with exceptional heart care, our providers are all part of one team.  This team includes your primary Cardiologist (physician) and Advanced Practice Providers or APPs (Physician Assistants and Nurse Practitioners) who all work together to provide you with the care you need, when you need it.  Your next appointment:   1 year(s)  Provider:   Emeline FORBES Calender, MD

## 2024-10-05 ENCOUNTER — Other Ambulatory Visit: Payer: Self-pay | Admitting: Emergency Medicine

## 2024-10-28 ENCOUNTER — Other Ambulatory Visit: Payer: Self-pay | Admitting: Medical Genetics

## 2024-10-28 DIAGNOSIS — Z006 Encounter for examination for normal comparison and control in clinical research program: Secondary | ICD-10-CM

## 2024-11-23 ENCOUNTER — Telehealth: Payer: Self-pay | Admitting: Internal Medicine

## 2024-11-23 DIAGNOSIS — R931 Abnormal findings on diagnostic imaging of heart and coronary circulation: Secondary | ICD-10-CM

## 2024-11-23 DIAGNOSIS — E782 Mixed hyperlipidemia: Secondary | ICD-10-CM

## 2024-11-23 NOTE — Telephone Encounter (Signed)
*  STAT* If patient is at the pharmacy, call can be transferred to refill team.   1. Which medications need to be refilled? (please list name of each medication and dose if known) Evolocumab  (REPATHA  SURECLICK) 140 MG/ML SOAJ    2. Would you like to learn more about the convenience, safety, & potential cost savings by using the [facility]?      3. Are you open to using the [facility] (Type [facility].  ).   4. Which pharmacy/location (including street and city if local pharmacy) is medication to be sent to?  [facility] - [place], [place] - [address]     5. Do they need a 30 day or 90 day supply? 90 day

## 2024-11-28 ENCOUNTER — Other Ambulatory Visit (HOSPITAL_COMMUNITY): Payer: Self-pay

## 2024-11-28 MED ORDER — REPATHA SURECLICK 140 MG/ML ~~LOC~~ SOAJ
140.0000 mg | SUBCUTANEOUS | 3 refills | Status: DC
  Filled 2024-11-28: qty 6, 84d supply, fill #0

## 2024-11-30 ENCOUNTER — Other Ambulatory Visit (HOSPITAL_BASED_OUTPATIENT_CLINIC_OR_DEPARTMENT_OTHER): Payer: Self-pay | Admitting: Pulmonary Disease

## 2024-11-30 NOTE — Telephone Encounter (Signed)
 Copied from CRM [id number]. Topic: Clinical - Medication Refill >> Nov 30, 2024  2:00 PM Jakia S wrote: Medication: predniSONE  (DELTASONE ) 5 MG tablet  Has the patient contacted their pharmacy? Yes (Agent: If no, request that the patient contact the pharmacy for the refill. If patient does not wish to contact the pharmacy document the reason why and proceed with request.) (Agent: If yes, when and what did the pharmacy advise?)  This is the patient's preferred pharmacy:  [facility] - [place], [place] - [address] [place] [address] Phone: [phone] Fax: [phone]  Is this the correct pharmacy for this prescription? Yes If no, delete pharmacy and type the correct one.   Has the prescription been filled recently? No  Is the patient out of the medication? No, 8 pills left  Has the patient been seen for an appointment in the last year OR does the patient have an upcoming appointment? No  Can we respond through MyChart? Yes  Agent: Please be advised that Rx refills may take up to 3 business days. We ask that you follow-up with your pharmacy.

## 2024-12-01 ENCOUNTER — Other Ambulatory Visit: Payer: Self-pay | Admitting: Pharmacist

## 2024-12-01 DIAGNOSIS — R931 Abnormal findings on diagnostic imaging of heart and coronary circulation: Secondary | ICD-10-CM

## 2024-12-01 DIAGNOSIS — E782 Mixed hyperlipidemia: Secondary | ICD-10-CM

## 2024-12-01 MED ORDER — REPATHA SURECLICK 140 MG/ML ~~LOC~~ SOAJ: 140.0000 mg | SUBCUTANEOUS | 3 refills | Status: AC

## 2024-12-05 ENCOUNTER — Other Ambulatory Visit: Payer: Self-pay | Admitting: Emergency Medicine

## 2024-12-07 ENCOUNTER — Telehealth: Payer: Self-pay | Admitting: Internal Medicine

## 2024-12-07 ENCOUNTER — Ambulatory Visit: Admitting: Emergency Medicine

## 2024-12-07 ENCOUNTER — Encounter: Payer: Self-pay | Admitting: Emergency Medicine

## 2024-12-07 ENCOUNTER — Telehealth: Payer: Self-pay

## 2024-12-07 DIAGNOSIS — I714 Abdominal aortic aneurysm, without rupture, unspecified: Secondary | ICD-10-CM

## 2024-12-07 DIAGNOSIS — J449 Chronic obstructive pulmonary disease, unspecified: Secondary | ICD-10-CM | POA: Diagnosis not present

## 2024-12-07 DIAGNOSIS — J410 Simple chronic bronchitis: Secondary | ICD-10-CM

## 2024-12-07 DIAGNOSIS — Z79899 Other long term (current) drug therapy: Secondary | ICD-10-CM

## 2024-12-07 MED ORDER — PREDNISONE 5 MG PO TABS: 5.0000 mg | ORAL_TABLET | Freq: Every day | ORAL | 3 refills | Status: AC

## 2024-12-07 MED ORDER — ALBUTEROL SULFATE HFA 108 (90 BASE) MCG/ACT IN AERS: 2.0000 | INHALATION_SPRAY | Freq: Four times a day (QID) | RESPIRATORY_TRACT | 2 refills | Status: AC | PRN

## 2024-12-07 MED ORDER — AMLODIPINE BESYLATE 5 MG PO TABS: 5.0000 mg | ORAL_TABLET | Freq: Every day | ORAL | 3 refills | Status: AC

## 2024-12-07 MED ORDER — AZITHROMYCIN 250 MG PO TABS: 250.0000 mg | ORAL_TABLET | Freq: Every day | ORAL | 3 refills | Status: AC

## 2024-12-07 MED ORDER — BREZTRI AEROSPHERE 160-9-4.8 MCG/ACT IN AERO: 2.0000 | INHALATION_SPRAY | Freq: Two times a day (BID) | RESPIRATORY_TRACT | 3 refills | Status: AC

## 2024-12-07 MED ORDER — LOSARTAN POTASSIUM 25 MG PO TABS: 25.0000 mg | ORAL_TABLET | Freq: Every day | ORAL | 3 refills | Status: AC

## 2024-12-07 NOTE — Assessment & Plan Note (Signed)
 Overall doing well.  No flares.  He has discussed the pros and cons of prednisone  with his PCP and we discussed further today.  He is opposed to stopping it, feels that has benefited significantly.  For now we will continue the same regimen.  Please continue Breztri  2 puffs twice a day.  Rinse and gargle after using. Keep albuterol  available to use 2 puffs if needed for shortness of breath, chest tightness, wheezing. Continue prednisone  5 mg once daily. Continue azithromycin  250 mg once daily. Recommend getting the flu shot each fall Follow with Dr. Shelah in 1 year. Please call sooner if you have any problems so we can see you at that time.SABRA

## 2024-12-07 NOTE — Telephone Encounter (Signed)
 Please change his hydrochlorothiazide  to amlodipine 5 mg and have the patient start losartan 25 mg with a BMP in 1 week.   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 He should follow up with vascular for this.   Thank you, Emeline Calender, DO

## 2024-12-07 NOTE — Telephone Encounter (Signed)
 Patient states his SBP has been elevated over his last 4 office visits in the 140's and even at home he has had SBP readings in the 140's.  He states his SBP was in the 120's before this recent increase. He has not missed any doses of his medications, currently taking diltiazem  240 mg daily and hydrochlorothiazide  12.5 mg daily.  He states he saw his urologist for symptoms of kidney stones, and a scan was performed. His urologist was concerned about how large his aneurysm is and referred him to a vascular surgeon. Patient cancelled the appt and wanted to follow-up with us  instead.  Patient states he has had very little energy for the past couple of years, he's gone downhill since his wife passed 2 years ago. He has increased his caloric and protein intake to try to help improve his energy levels. He states he is eating more meat, up to 12 oz of meat daily. He believes this contributed to his kidney stones and possibly elevated BP.  He denies any symptoms of dizziness, headache, SOB, or chest pain.  Will forward to Dr. Kriste to review for further advisement.

## 2024-12-07 NOTE — Telephone Encounter (Signed)
 I called and spoke with the patient. I let him know of what Dr. Kriste suggested. Per Dr. Kriste, Please change his hydrochlorothiazide  to amlodipine 5 mg and have the patient start losartan 25 mg with a BMP in 1 week. 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 He should follow up with vascular for this.. Patient understood and I was able to put in those orders.

## 2024-12-07 NOTE — Telephone Encounter (Signed)
°  Per MyChart scheduling message:  Pt c/o BP issue: STAT if pt c/o blurred vision, one-sided weakness or slurred speech.  STAT if BP is GREATER than 180/120 TODAY.  STAT if BP is LESS than 90/60 and SYMPTOMATIC TODAY  1. What is your BP concern?   2. Have you taken any BP medication today?  3. What are your last 5 BP readings?  4. Are you having any other symptoms (ex. Dizziness, headache, blurred vision, passed out)?   1. BP concern is if the elevated readings are contributing to enlarging the aortic aneurysm. 2. I never miss taking medication 3. Present BP 149/84, last four have been in 140s. Historically, I have been in the 120s. 4. No symptoms.   This may not be a concern for a man of my age, but a urologist was concerned about the aneurysm and referred me to a vascular surgeon before telling me. I would appreciate your advice on my situation.

## 2024-12-07 NOTE — Addendum Note (Signed)
 Addended by: KRASOWSKI, Emeri Estill on: 12/07/2024 01:36 PM   Modules accepted: Orders

## 2024-12-07 NOTE — Patient Instructions (Addendum)
 Please continue Breztri  2 puffs twice a day.  Rinse and gargle after using. Keep albuterol  available to use 2 puffs if needed for shortness of breath, chest tightness, wheezing. Continue prednisone  5 mg once daily. Continue azithromycin  250 mg once daily. Recommend getting the flu shot each fall Follow with Dr. Shelah in 1 year. Please call sooner if you have any problems so we can see you at that time.SABRA

## 2024-12-07 NOTE — Progress Notes (Signed)
 Subjective:    Patient ID: Tristan Le, male    DOB: 06-13-1941, 83 y.o.   MRN: [id number]  COPD He complains of cough, shortness of breath and wheezing. Pertinent negatives include no chest pain, ear pain, fever, headaches, postnasal drip, rhinorrhea, sneezing, sore throat or trouble swallowing. His past medical history is significant for COPD.    ROV 08/19/2023 --follow-up visit for 83 year old gentleman with history of multifactorial shortness of breath.  He has obesity, COPD with mixed obstruction and restriction on his pulmonary function testing, also some deconditioning.  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  Currently scheduled azithromycin  daily On Breztri , albuterol  and DuoNeb available. Uses Duoneb bid. Occasional albuterol .  Unfortunately since last time his wife has passed away and he has had to deal with the grief from this.  He describes no flares since last time. Minimal cough, usually in the am with some. He has some exertional SOB when carrying objects, taking out trash. SOB w bending.   ROV 12/07/2024 --follow-up visit 83 year old male with a history of obesity and associated restrictive lung disease, COPD with obstruction, deconditioning, all contributing to his chronic dyspnea.  He has been on chronic prednisone  5 mg, scheduled azithromycin , Breztri .  He feels that he is doing very well on the prednisone  - breathing and cough, drainage are better. Some occasional cough when the humidity is high. Minimal mucous burden, often in his throat in the am. Remains on Breztri  and believes that he is benefiting from it. No flares. He has skipped the flu shot for the last couple of years.     Review of Systems  Constitutional:  Negative for fever and unexpected weight change.  HENT:  Negative for congestion, dental problem, ear pain, nosebleeds, postnasal drip, rhinorrhea, sinus pressure, sneezing, sore throat and  trouble swallowing.   Eyes:  Negative for redness and itching.  Respiratory:  Positive for cough, shortness of breath and wheezing. Negative for chest tightness.   Cardiovascular:  Negative for chest pain, palpitations and leg swelling.  Gastrointestinal:  Negative for nausea and vomiting.  Genitourinary:  Negative for dysuria.  Musculoskeletal:  Negative for joint swelling.  Skin:  Negative for rash.  Neurological:  Negative for headaches.  Hematological:  Does not bruise/bleed easily.  Psychiatric/Behavioral:  Negative for dysphoric mood. The patient is not nervous/anxious.         Objective:   Physical Exam Vitals:   12/07/24 1439  BP: 138/72  Pulse: 80  SpO2: 97%  Weight: 196 lb (88.9 kg)  Height: 5' 11 (1.803 m)    Gen: Pleasant, well-nourished, in no distress, somewhat stoic affect  ENT: No lesions,  mouth clear, edentulous, oropharynx clear, no postnasal drip  Neck: No JVD, no stridor  Lungs: No use of accessory muscles, rhonchi on the right.  Distant, no wheeze  Cardiovascular: RRR, heart sounds normal, no murmur or gallops, no peripheral edema  Musculoskeletal: No deformities, no cyanosis or clubbing  Neuro: alert, awake, non focal  Skin: Warm, no lesions or rash     Assessment & Plan:  COPD (chronic obstructive pulmonary disease) (HCC) Overall doing well.  No flares.  He has discussed the pros and cons of prednisone  with his PCP and we discussed further today.  He is opposed to stopping it, feels that has benefited significantly.  For now we will continue the same regimen.  Please continue Breztri  2 puffs twice a day.  Rinse and  gargle after using. Keep albuterol  available to use 2 puffs if needed for shortness of breath, chest tightness, wheezing. Continue prednisone  5 mg once daily. Continue azithromycin  250 mg once daily. Recommend getting the flu shot each fall Follow with Dr. Shelah in 1 year. Please call sooner if you have any problems so we can see  you at that time.SABRA Lamar Shelah, MD, PhD 12/07/2024, 2:59 PM [facility] [phone] or if no answer [phone]

## 2024-12-07 NOTE — Telephone Encounter (Signed)
 Copied from CRM [id number]. Topic: Clinical - Medication Refill >> Nov 30, 2024  2:00 PM Devaughn S wrote: Medication: predniSONE  (DELTASONE ) 5 MG tablet  Has the patient contacted their pharmacy? Yes (Agent: If no, request that the patient contact the pharmacy for the refill. If patient does not wish to contact the pharmacy document the reason why and proceed with request.) (Agent: If yes, when and what did the pharmacy advise?)  This is the patient's preferred pharmacy:  [facility] - [place], [place] - [address] [place] [address] Phone: [phone] Fax: [phone]  Is this the correct pharmacy for this prescription? Yes If no, delete pharmacy and type the correct one.   Has the prescription been filled recently? No  Is the patient out of the medication? No, 8 pills left  Has the patient been seen for an appointment in the last year OR does the patient have an upcoming appointment? No  Can we respond through MyChart? Yes  Agent: Please be advised that Rx refills may take up to 3 business days. We ask that you follow-up with your pharmacy. >> Dec 06, 2024  4:57 PM Rozanna G wrote: Pt called last week for his predniSONE  refill it looks like it was sent to Dr Brenna instead of Dr Shelah. Pt has not seen Dr Shelah since 06/2023 advised him he may have to be seen, scheduled him an appt for 12/10.   PATIENT HAS AN APPOINTMENT TODAY - nfn

## 2024-12-08 ENCOUNTER — Encounter: Payer: Self-pay | Admitting: Internal Medicine

## 2024-12-10 LAB — GENECONNECT MOLECULAR SCREEN: Genetic Analysis Overall Interpretation: NEGATIVE

## 2024-12-19 ENCOUNTER — Other Ambulatory Visit: Payer: Self-pay

## 2024-12-19 DIAGNOSIS — I7143 Infrarenal abdominal aortic aneurysm, without rupture: Secondary | ICD-10-CM

## 2024-12-21 ENCOUNTER — Telehealth: Payer: Self-pay | Admitting: Internal Medicine

## 2024-12-21 NOTE — Telephone Encounter (Signed)
 Pt c/o medication issue:  1. Name of Medication:   amLODipine  (NORVASC ) 5 MG tablet    hydrochlorothiazide  (MICROZIDE ) 12.5 MG capsule   2. How are you currently taking this medication (dosage and times per day)? As written   3. Are you having a reaction (difficulty breathing--STAT)? No   4. What is your medication issue? Pt called in stating since he has been taking this med he has bad dreams and he can't sleep. Please advise.

## 2024-12-21 NOTE — Telephone Encounter (Signed)
 at first I was concerned about the prednisone , but it appears this is chronic use. I agree with holding amlodipine  (some reports of bad dreams with this). Would hold losartan  too. If in a few days his bad dreams go away, would start him on irbesartan , or retry the losartan .

## 2024-12-21 NOTE — Telephone Encounter (Signed)
 Spoke with pt regarding medication reaction.   Pt reports started taking amlodipine  and losartan  on 12/17/23.  Starting night of 12/19/24 had bad dreams. Expresses they were weird and almost made him feel suicidal.    Pt had recent medication change on 12/07/24 stop hydrochlorothiazide  start amlodipine  and losartan .    Pt denies being suicidal at this time.  Reports is having a hard time aging; wife died a couple years ago.  Had a dream last night that wife was in danger and he couldn't help d/t his age.  Expresses if he has to continue like this he would rather not be here and will stop all medications.  Pt denies ETOH or drug use and denies starting other new medications.  Denies prior psychological history.  Asked pt if he needs me to call 911 to come out and evaluate him.  Pt reports will not harm himself has a lot of family/friends around to offer support. Pt is bothered by how harsh dreams have been.  Advised pt to reach out to support system and address concerns.   Advised pt to stop both amlodipine  and losartan  monitor BP as more than likely will go up.  Advised to seek help if suicidal thoughts return.  Will send to our Pharmacy team to see if a combination of the medications he is taking could contribute to concern.   Will send to MD to advise.

## 2024-12-21 NOTE — Telephone Encounter (Signed)
 Addendum: pt denies having negative thought r/t Holiday season without wife.  Expresses doesn't celebrated Christmas.

## 2024-12-21 NOTE — Telephone Encounter (Signed)
 Called pt advised of Dr. Ginette DOD and Pharmacist recommendations.  Pt expresses will not take amlodipine  or losartan . Will send to Primary Cardiologist to f/u.

## 2024-12-23 NOTE — Telephone Encounter (Signed)
 Spoke with pt regarding his dreams. Pt stated since he has stopped taking the amlodipine  and losartan  he has not had any violent dreams and has been sleeping very well. When the pt was asked if he was having any suicidal ideations he denied ever being suicidal and stated that the dreams had made him not want to be here. Pt no longer feels this way. Pt was told that Dr. Kriste would be notified. Pt verbalized understanding. All questions if any were answered.

## 2024-12-25 ENCOUNTER — Other Ambulatory Visit: Payer: Self-pay | Admitting: Nurse Practitioner

## 2024-12-25 DIAGNOSIS — J449 Chronic obstructive pulmonary disease, unspecified: Secondary | ICD-10-CM

## 2024-12-26 NOTE — Telephone Encounter (Signed)
 Called pt to f/u.  Reports no longer having terrible dreams since stopping amlodipine  and losartan .  Reports BP today 117/65  also reports average BP is 129/71 per BP machine.  Advised pt to continue to monitor BP if notes BP increasing to contact our office.   Pt expresses understanding and thanked me for calling.

## 2025-01-04 ENCOUNTER — Ambulatory Visit: Admitting: Internal Medicine

## 2025-01-11 NOTE — Progress Notes (Unsigned)
 "   Patient ID: Tristan Le, male   DOB: 10/30/1941, 84 y.o.   MRN: [id number]  Reason for Consult: No chief complaint on file.   Referred by Stoney Ryder, PA  Subjective:     HPI Tristan Le is a 84 y.o. 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 He denies any previous knowledge of this aneurysm.  He denies any new or unusual abdominal or back pain.  He denies any previous intra-abdominal surgeries.  Past Medical History:  Diagnosis Date   Arthritis    Chest pain    COPD (chronic obstructive pulmonary disease) (HCC) 05/18/2019   Cough 05/18/2019   06/13/2019-AFB-negative Fungal culture-showing yeast Respiratory sputum culture-negative    GERD (gastroesophageal reflux disease)    Hyperlipidemia    Hypertension    Family History  Problem Relation Age of Onset   Heart disease Father    Heart disease Brother    Hyperlipidemia Brother    No past surgical history on file.  Short Social History:  Social History   Tobacco Use   Smoking status: Every Day    Current packs/day: 0.00    Average packs/day: 1 pack/day for 60.0 years (60.0 ttl pk-yrs)    Types: Cigarettes    Start date: 04/27/1959    Last attempt to quit: 04/27/2019    Years since quitting: 5.7   Smokeless tobacco: Never   Tobacco comments:    5-8 cigs per day 12/07/24   Substance Use Topics   Alcohol use: No    Allergies[1]  Current Outpatient Medications  Medication Sig Dispense Refill   albuterol  (VENTOLIN  HFA) 108 (90 Base) MCG/ACT inhaler Inhale 2 puffs into the lungs every 6 (six) hours as needed for wheezing or shortness of breath. 3 each 2   amLODipine  (NORVASC ) 5 MG tablet Take 1 tablet (5 mg total) by mouth daily. (Patient not taking: Reported on 12/07/2024) 180 tablet 3   Ascorbic Acid (VITAMIN C) 1000 MG tablet Take 1,000 mg by mouth daily.     azithromycin  (ZITHROMAX ) 250 MG tablet Take 1  tablet (250 mg total) by mouth daily. 90 tablet 3   budesonide -glycopyrrolate-formoterol (BREZTRI  AEROSPHERE) 160-9-4.8 MCG/ACT AERO inhaler Inhale 2 puffs into the lungs 2 (two) times daily. 3 each 3   Cholecalciferol ([employer] VIT D 5000 HIGH-POTENCY PO) Take by mouth daily at 6 (six) AM.     Cyanocobalamin (VITAMIN B 12 PO) Take 1 tablet by mouth daily. Pt unsure of strength     diltiazem  (CARDIZEM  CD) 240 MG 24 hr capsule Take 1 capsule (240 mg total) by mouth daily. 30 capsule 0   EPINEPHrine  0.3 mg/0.3 mL IJ SOAJ injection Inject 0.3 mg into the muscle as needed for anaphylaxis. 1 each 0   Evolocumab  (REPATHA  SURECLICK) 140 MG/ML SOAJ Inject 140 mg into the skin every 14 (fourteen) days. 6 mL 3   guaiFENesin (MUCINEX) 600 MG 12 hr tablet Take 1 tablet by mouth 2 (two) times daily.      hydrochlorothiazide  (MICROZIDE ) 12.5 MG capsule Take 12.5 mg by mouth daily.     ipratropium-albuterol  (DUONEB) 0.5-2.5 (3) MG/3ML SOLN USE 1 VIAL IN NEBULIZER EVERY 6 HOURS 120 mL 11   losartan  (COZAAR ) 25 MG tablet Take 1 tablet (25 mg total) by mouth daily. (Patient not taking: Reported on 12/07/2024) 90 tablet 3   omega-3 acid ethyl esters (LOVAZA) 1 g  capsule Take 2 g by mouth daily.     predniSONE  (DELTASONE ) 5 MG tablet Take 1 tablet (5 mg total) by mouth daily. 90 tablet 3   Respiratory Therapy Supplies (FLUTTER) DEVI Use as directed 1 each 0   tamsulosin (FLOMAX) 0.4 MG CAPS capsule Take 0.4 mg by mouth daily.     No current facility-administered medications for this visit.    REVIEW OF SYSTEMS  All other systems were reviewed and are negative     Objective:  Objective   There were no vitals filed for this visit. There is no height or weight on file to calculate BMI.  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  We discussed the natural history as well as rupture risk of aneurysm less than 5 cm.  We discussed that the threshold for repair is generally 5.5 cm but it we sometimes discussed repair 5.0 cm depending on the anatomy.    Will plan for follow-up in 6 months with a CTA  SVS AAA Surveillance Guidelines  < 3.0 cm  10 years 3.0 - 3.9 cm  3 years 4.0 - 4.9 cm  1 year   (consider repair at 5cm) 5.0 - 5.4 cm  6 months  Consider repair for women and low surgical risk men > 4.9cm Recommend repair if > 5.4 or growth > 1.0cm/yr or > 0.5cm/72month   Recommendations to optimize cardiovascular risk: Abstinence from all tobacco products. Blood glucose control with goal A1c < 7%. Blood pressure control with goal blood pressure < 140/90 mmHg. Lipid reduction therapy with goal LDL-C <55 mg/dL  Aspirin 81mg  PO QD.  Atorvastatin 40-80mg  PO QD (or other high intensity statin therapy).   Norman GORMAN Serve MD [facility]      [1]  Allergies Allergen Reactions   Cefuroxime  Anaphylaxis and Itching   Penicillins Other (See Comments)    REACTION: Upset stomach,mouth ulcers,rash   Sulfonamide Derivatives Rash    REACTION: Rash with huge blisters   "

## 2025-01-13 ENCOUNTER — Ambulatory Visit (HOSPITAL_COMMUNITY): Admitting: Vascular Surgery

## 2025-01-13 ENCOUNTER — Ambulatory Visit (HOSPITAL_COMMUNITY)
